# Patient Record
Sex: Male | Born: 1960 | Race: White | Hispanic: No | Marital: Married | Smoking: Current some day smoker
Health system: Southern US, Community
[De-identification: ages and names within clinical notes are randomized; demographics above are authoritative.]

## PROBLEM LIST (undated history)

## (undated) DIAGNOSIS — M353 Polymyalgia rheumatica: Secondary | ICD-10-CM

## (undated) DIAGNOSIS — I10 Essential (primary) hypertension: Secondary | ICD-10-CM

## (undated) DIAGNOSIS — R918 Other nonspecific abnormal finding of lung field: Secondary | ICD-10-CM

## (undated) DIAGNOSIS — J449 Chronic obstructive pulmonary disease, unspecified: Secondary | ICD-10-CM

## (undated) DIAGNOSIS — I700 Atherosclerosis of aorta: Secondary | ICD-10-CM

## (undated) DIAGNOSIS — M5136 Other intervertebral disc degeneration, lumbar region: Secondary | ICD-10-CM

## (undated) DIAGNOSIS — I5020 Unspecified systolic (congestive) heart failure: Secondary | ICD-10-CM

## (undated) DIAGNOSIS — Z7952 Long term (current) use of systemic steroids: Secondary | ICD-10-CM

## (undated) DIAGNOSIS — I7143 Infrarenal abdominal aortic aneurysm, without rupture: Secondary | ICD-10-CM

## (undated) DIAGNOSIS — I712 Thoracic aortic aneurysm, without rupture: Secondary | ICD-10-CM

## (undated) DIAGNOSIS — K219 Gastro-esophageal reflux disease without esophagitis: Secondary | ICD-10-CM

## (undated) DIAGNOSIS — Z720 Tobacco use: Secondary | ICD-10-CM

## (undated) DIAGNOSIS — I255 Ischemic cardiomyopathy: Secondary | ICD-10-CM

## (undated) DIAGNOSIS — R590 Localized enlarged lymph nodes: Secondary | ICD-10-CM

## (undated) DIAGNOSIS — I7121 Aneurysm of the ascending aorta, without rupture: Secondary | ICD-10-CM

## (undated) DIAGNOSIS — I2510 Atherosclerotic heart disease of native coronary artery without angina pectoris: Secondary | ICD-10-CM

## (undated) DIAGNOSIS — R001 Bradycardia, unspecified: Secondary | ICD-10-CM

## (undated) DIAGNOSIS — E785 Hyperlipidemia, unspecified: Secondary | ICD-10-CM

## (undated) DIAGNOSIS — Z7901 Long term (current) use of anticoagulants: Secondary | ICD-10-CM

## (undated) DIAGNOSIS — I714 Abdominal aortic aneurysm, without rupture, unspecified: Secondary | ICD-10-CM

## (undated) DIAGNOSIS — M51369 Other intervertebral disc degeneration, lumbar region without mention of lumbar back pain or lower extremity pain: Secondary | ICD-10-CM

## (undated) DIAGNOSIS — Z789 Other specified health status: Secondary | ICD-10-CM

## (undated) DIAGNOSIS — C3490 Malignant neoplasm of unspecified part of unspecified bronchus or lung: Secondary | ICD-10-CM

## (undated) DIAGNOSIS — N200 Calculus of kidney: Secondary | ICD-10-CM

## (undated) DIAGNOSIS — G459 Transient cerebral ischemic attack, unspecified: Secondary | ICD-10-CM

## (undated) HISTORY — DX: Abdominal aortic aneurysm, without rupture: I71.4

## (undated) HISTORY — DX: Hyperlipidemia, unspecified: E78.5

## (undated) HISTORY — PX: LUNG BIOPSY: SHX232

## (undated) HISTORY — DX: Abdominal aortic aneurysm, without rupture, unspecified: I71.40

## (undated) HISTORY — PX: APPENDECTOMY: SHX54

## (undated) HISTORY — DX: Polymyalgia rheumatica: M35.3

## (undated) HISTORY — DX: Aneurysm of the ascending aorta, without rupture: I71.21

## (undated) HISTORY — PX: CARDIAC CATHETERIZATION: SHX172

## (undated) HISTORY — DX: Unspecified systolic (congestive) heart failure: I50.20

## (undated) HISTORY — DX: Transient cerebral ischemic attack, unspecified: G45.9

## (undated) HISTORY — DX: Localized enlarged lymph nodes: R59.0

## (undated) HISTORY — DX: Thoracic aortic aneurysm, without rupture: I71.2

## (undated) HISTORY — DX: Malignant neoplasm of unspecified part of unspecified bronchus or lung: C34.90

## (undated) HISTORY — DX: Ischemic cardiomyopathy: I25.5

## (undated) HISTORY — PX: BACK SURGERY: SHX140

---

## 2006-01-01 HISTORY — PX: LEFT HEART CATH AND CORONARY ANGIOGRAPHY: CATH118249

## 2006-10-08 ENCOUNTER — Observation Stay (HOSPITAL_COMMUNITY): Admission: EM | Admit: 2006-10-08 | Discharge: 2006-10-09 | Payer: Self-pay | Admitting: Pediatrics

## 2006-10-08 ENCOUNTER — Ambulatory Visit: Payer: Self-pay | Admitting: Cardiology

## 2006-10-09 ENCOUNTER — Encounter: Payer: Self-pay | Admitting: Internal Medicine

## 2006-10-11 ENCOUNTER — Emergency Department: Payer: Self-pay | Admitting: Emergency Medicine

## 2006-10-15 ENCOUNTER — Ambulatory Visit: Payer: Self-pay

## 2006-10-15 LAB — CONVERTED CEMR LAB
Calcium: 9.1 mg/dL (ref 8.4–10.5)
Creatinine, Ser: 0.9 mg/dL (ref 0.4–1.5)
Glucose, Bld: 84 mg/dL (ref 70–99)
Platelets: 257 10*3/uL (ref 150–400)
Potassium: 4 meq/L (ref 3.5–5.1)
Prothrombin Time: 11.4 s (ref 10.9–13.3)
RBC: 4.75 M/uL (ref 4.22–5.81)
RDW: 12.2 % (ref 11.5–14.6)
Sodium: 143 meq/L (ref 135–145)
aPTT: 32.8 s — ABNORMAL HIGH (ref 21.7–29.8)

## 2006-10-17 ENCOUNTER — Ambulatory Visit: Payer: Self-pay | Admitting: Cardiology

## 2006-10-18 ENCOUNTER — Inpatient Hospital Stay (HOSPITAL_BASED_OUTPATIENT_CLINIC_OR_DEPARTMENT_OTHER): Admission: RE | Admit: 2006-10-18 | Discharge: 2006-10-18 | Payer: Self-pay | Admitting: Cardiovascular Disease

## 2006-10-18 ENCOUNTER — Ambulatory Visit: Payer: Self-pay | Admitting: Cardiovascular Disease

## 2006-11-04 ENCOUNTER — Ambulatory Visit: Payer: Self-pay | Admitting: Cardiology

## 2010-05-16 NOTE — Cardiovascular Report (Signed)
NAME:  Clinton Walsh, Clinton Walsh NO.:  [id number]   MEDICAL RECORD NO.:  [id number]          PATIENT TYPE:  OIB   LOCATION:  1961                         FACILITY:  [facility]   PHYSICIAN:  Veverly Fells. Excell Seltzer, MD  DATE OF BIRTH:  March 21, 1960   DATE OF PROCEDURE:  10/18/2006  DATE OF DISCHARGE:  10/18/2006                            CARDIAC CATHETERIZATION   PROCEDURE:  Left heart catheterization, selective coronary angiography,  left ventricular angiography.   INDICATIONS:  Clinton Walsh is a 50 year old gentleman with multiple  cardiac risk factors.  He was hospitalized with chest pain and ruled out  for MI.  He was brought to the office for an outpatient stress test and  was unable to walk on the treadmill due to severe hypertension.  He was  converted to dobutamine because of lung disease and with dobutamine he  developed severe hypertension once again with no increase in heart rate.  He was subsequently referred for cardiac catheterization.   Risks and indications of the procedure were reviewed with the patient  and informed consent was obtained.  The right groin was prepped, draped,  anesthetized with 1% lidocaine.  Using modified Seldinger technique a 4-  French sheath was placed in the right femoral artery.  Standard 4-French  catheters were used for coronary angiography.  A 4-French angled pigtail  catheter was used for left ventriculography.  All catheter exchanges  were performed over a guidewire.  There were no immediate complications.   FINDINGS:  Aortic pressure 102/57 with a mean of 77.  Left ventricular  pressure was 103/4.   Left main stem is angiographically normal.  It bifurcates into the LAD  and left circumflex.   The LAD is a medium caliber vessel that courses down and wraps around  the LV apex.  Proximal LAD is angiographically normal.  The LAD supplies  a large first diagonal branch that is angiographically normal.  Just  beyond the diagonal in the  midportion of the LAD there is a 20% smooth  stenosis.  The remaining portions of the mid and distal LAD have no  significant angiographic stenosis.  There is a small second diagonal  branch present arising from the midportion of the LAD.   The left circumflex is relatively small.  There is a small first OM.  The circumflex courses down and supplies a medium second OM branch.  The  AV groove circumflex beyond the second OM is small.  There is no  significant angiographic stenosis throughout the left circumflex system.   The right coronary artery is dominant.  Proximally there is minor  nonobstructive plaque.  The vessel supplies an acute marginal branch, a  PDA and 2 posterolateral branches.  Other than the proximal  nonobstructive plaque there is no other significant angiographic  stenosis throughout the right coronary artery or its branches.   Left ventricular function assessed by 30 degrees RAO left  ventriculography is normal.  The LVEF is 55%.  There is no mitral  regurgitation.   ASSESSMENT:  1. Minor nonobstructive plaque in the left anterior descending.  2. Minor nonobstructive plaque in the right coronary artery.  3. Normal left circumflex.  4. Normal left ventricular function.   PLAN:  Clinton Walsh has had noncardiac chest pain.  He has no  significant angiographic stenoses.  He does have nonobstructive plaque  in his coronaries and would benefit from secondary risk reduction with  an LDL goal less than 100 as well as tobacco cessation.  He will follow  up with Dr. Antoine Poche in the clinic.  Of note he has had a CT of the  chest during his hospitalization to rule out pulmonary embolism and  indeterminate lung nodules were visualized, warranting serial  radiographic follow-up.      Veverly Fells. Excell Seltzer, MD  Electronically Signed     MDC/MEDQ  D:  10/18/2006  T:  10/19/2006  Job:  [id number]   cc:   [name], MD, [facility]  Rollene Rotunda, MD, [facility]
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
## 2010-05-16 NOTE — Procedures (Signed)
[facility]                             NUCLEAR IMAGING STUDY   NAME:[name], Clinton Walsh                MRN:          [id number]  DATE:10/15/2006                            DOB:          [birth year]    Attempt was made today to do a stress Myoview. The study was started by  trying to have him walk on the treadmill. He developed marked  hypertension as his heart rate got increased to approximately 96 and the  study was stopped. The patient had significant lung disease and a  decision was made to proceed with dobutamine study. The patient was  stressed with dobutamine IV with a dose as high as 40 mcg/kg per minute.  He had no substantial increase in heart rate. He did have increasing  blood pressure with it. There was some slight chest tightness. However,  a decision was made not to obtain stress images. Therefore we have only  rest images.   All of this data is outlined in an office visit that was done  immediately after the study. The purpose of this note is to give a  narrative to explain this rest only Myoview scan.     [name], MD, [facility]  Electronically Signed    JDK/MedQ  DD: 10/15/2006  DT: 10/16/2006  Job #: [id number]
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
## 2010-10-12 LAB — COMPREHENSIVE METABOLIC PANEL
Calcium: 9.4
Creatinine, Ser: 1.09
Glucose, Bld: 101 — ABNORMAL HIGH
Sodium: 141
Total Bilirubin: 0.3
Total Protein: 7

## 2010-10-12 LAB — I-STAT 8, (EC8 V) (CONVERTED LAB)
Acid-Base Excess: 1
Bicarbonate: 21.9
Glucose, Bld: 82
HCT: 51
Hemoglobin: 17.3 — ABNORMAL HIGH
TCO2: 23
pH, Ven: 7.531 — ABNORMAL HIGH

## 2010-10-12 LAB — CBC
HCT: 45.3
Hemoglobin: 14.6
MCHC: 34
MCHC: 34.4
MCV: 92.9
MCV: 93
Platelets: 254
RBC: 4.87
RDW: 13.2
WBC: 4.4
WBC: 4.6

## 2010-10-12 LAB — CK TOTAL AND CKMB (NOT AT ARMC)
CK, MB: 1.9
Total CK: 78

## 2010-10-12 LAB — LIPID PANEL
Cholesterol: 212 — ABNORMAL HIGH
Total CHOL/HDL Ratio: 9.2
Triglycerides: 213 — ABNORMAL HIGH

## 2010-10-12 LAB — POCT I-STAT CREATININE: Creatinine, Ser: 1

## 2010-10-12 LAB — D-DIMER, QUANTITATIVE: D-Dimer, Quant: 0.9 — ABNORMAL HIGH

## 2010-10-12 LAB — DIFFERENTIAL
Eosinophils Absolute: 0.1
Eosinophils Relative: 2
Lymphocytes Relative: 36
Neutrophils Relative %: 51

## 2010-10-12 LAB — APTT: aPTT: 32

## 2010-10-12 LAB — CARDIAC PANEL(CRET KIN+CKTOT+MB+TROPI)
Relative Index: INVALID
Total CK: 129

## 2010-10-12 LAB — PROTIME-INR: Prothrombin Time: 13

## 2010-10-12 LAB — POCT CARDIAC MARKERS: Operator id: 285841

## 2010-10-12 LAB — TROPONIN I: Troponin I: 0.01

## 2011-03-16 ENCOUNTER — Other Ambulatory Visit: Payer: Self-pay

## 2011-03-16 ENCOUNTER — Encounter (HOSPITAL_COMMUNITY)
Admission: EM | Disposition: A | Discharge status: Home / Self Care | Payer: Self-pay | Source: Home / Self Care | Attending: Cardiology

## 2011-03-16 ENCOUNTER — Inpatient Hospital Stay (HOSPITAL_COMMUNITY)
Admission: EM | Admit: 2011-03-16 | Discharge: 2011-03-18 | DRG: 249 | Disposition: A | Discharge status: Home / Self Care | Payer: 59 | Attending: Cardiology | Admitting: Cardiology

## 2011-03-16 ENCOUNTER — Emergency Department (HOSPITAL_COMMUNITY): Payer: Self-pay

## 2011-03-16 ENCOUNTER — Encounter (HOSPITAL_COMMUNITY): Payer: Self-pay | Admitting: *Deleted

## 2011-03-16 DIAGNOSIS — I214 Non-ST elevation (NSTEMI) myocardial infarction: Principal | ICD-10-CM

## 2011-03-16 DIAGNOSIS — R001 Bradycardia, unspecified: Secondary | ICD-10-CM

## 2011-03-16 DIAGNOSIS — I2510 Atherosclerotic heart disease of native coronary artery without angina pectoris: Secondary | ICD-10-CM

## 2011-03-16 DIAGNOSIS — R748 Abnormal levels of other serum enzymes: Secondary | ICD-10-CM

## 2011-03-16 DIAGNOSIS — F17200 Nicotine dependence, unspecified, uncomplicated: Secondary | ICD-10-CM

## 2011-03-16 DIAGNOSIS — E785 Hyperlipidemia, unspecified: Secondary | ICD-10-CM

## 2011-03-16 DIAGNOSIS — Z87898 Personal history of other specified conditions: Secondary | ICD-10-CM

## 2011-03-16 DIAGNOSIS — I2119 ST elevation (STEMI) myocardial infarction involving other coronary artery of inferior wall: Secondary | ICD-10-CM

## 2011-03-16 DIAGNOSIS — I498 Other specified cardiac arrhythmias: Secondary | ICD-10-CM | POA: Diagnosis present

## 2011-03-16 DIAGNOSIS — R079 Chest pain, unspecified: Secondary | ICD-10-CM

## 2011-03-16 HISTORY — PX: LEFT HEART CATHETERIZATION WITH CORONARY ANGIOGRAM: SHX5451

## 2011-03-16 HISTORY — DX: Atherosclerotic heart disease of native coronary artery without angina pectoris: I25.10

## 2011-03-16 HISTORY — DX: Non-ST elevation (NSTEMI) myocardial infarction: I21.4

## 2011-03-16 HISTORY — DX: Tobacco use: Z72.0

## 2011-03-16 HISTORY — DX: Other nonspecific abnormal finding of lung field: R91.8

## 2011-03-16 LAB — POCT I-STAT TROPONIN I

## 2011-03-16 LAB — DIFFERENTIAL
Eosinophils Absolute: 0 10*3/uL (ref 0.0–0.7)
Eosinophils Relative: 0 % (ref 0–5)
Lymphs Abs: 1 10*3/uL (ref 0.7–4.0)
Monocytes Absolute: 0.7 10*3/uL (ref 0.1–1.0)
Neutro Abs: 7.8 10*3/uL — ABNORMAL HIGH (ref 1.7–7.7)

## 2011-03-16 LAB — CBC
HCT: 41.7 % (ref 39.0–52.0)
MCV: 89.5 fL (ref 78.0–100.0)
RBC: 4.66 MIL/uL (ref 4.22–5.81)
WBC: 9.5 10*3/uL (ref 4.0–10.5)

## 2011-03-16 LAB — CARDIAC PANEL(CRET KIN+CKTOT+MB+TROPI)
CK, MB: 141.5 ng/mL (ref 0.3–4.0)
Relative Index: 4.5 — ABNORMAL HIGH (ref 0.0–2.5)
Total CK: 146 U/L (ref 7–232)
Total CK: 1064 U/L — ABNORMAL HIGH (ref 7–232)
Troponin I: 0.47 ng/mL (ref <0.30)
Troponin I: 2.9 ng/mL (ref <0.30)

## 2011-03-16 LAB — COMPREHENSIVE METABOLIC PANEL
ALT: 16 U/L (ref 0–53)
AST: 24 U/L (ref 0–37)
Albumin: 3.8 g/dL (ref 3.5–5.2)
Alkaline Phosphatase: 81 U/L (ref 39–117)
Chloride: 103 mEq/L (ref 96–112)
GFR calc non Af Amer: >90 mL/min (ref >90)
Glucose, Bld: 123 mg/dL — ABNORMAL HIGH (ref 70–99)
Potassium: 4 mEq/L (ref 3.5–5.1)
Sodium: 138 mEq/L (ref 135–145)
Total Bilirubin: 0.3 mg/dL (ref 0.3–1.2)

## 2011-03-16 LAB — MRSA PCR SCREENING: MRSA by PCR: NEGATIVE

## 2011-03-16 LAB — D-DIMER, QUANTITATIVE: D-Dimer, Quant: 0.59 ug/mL-FEU — ABNORMAL HIGH (ref 0.00–0.48)

## 2011-03-16 SURGERY — LEFT HEART CATHETERIZATION WITH CORONARY ANGIOGRAM
Anesthesia: LOCAL

## 2011-03-16 MED ORDER — LIDOCAINE HCL (PF) 1 % IJ SOLN
INTRAMUSCULAR | Status: AC
  Filled 2011-03-16: qty 30

## 2011-03-16 MED ORDER — ATORVASTATIN CALCIUM 40 MG PO TABS
40.0000 mg | ORAL_TABLET | Freq: Every day | ORAL | Status: DC
  Administered 2011-03-16 – 2011-03-17 (×2): 40 mg via ORAL
  Filled 2011-03-16 (×3): qty 1

## 2011-03-16 MED ORDER — SODIUM CHLORIDE 0.9 % IV SOLN: 250.0000 mL | INTRAVENOUS | Status: DC | PRN

## 2011-03-16 MED ORDER — HYDROMORPHONE HCL PF 1 MG/ML IJ SOLN: 1.0000 mg | Freq: Once | INTRAMUSCULAR | Status: DC

## 2011-03-16 MED ORDER — SODIUM CHLORIDE 0.9 % IV SOLN: INTRAVENOUS | Status: DC

## 2011-03-16 MED ORDER — HEPARIN BOLUS VIA INFUSION
3000.0000 [IU] | Freq: Once | INTRAVENOUS | Status: AC
  Administered 2011-03-16: 3000 [IU] via INTRAVENOUS

## 2011-03-16 MED ORDER — FENTANYL CITRATE 0.05 MG/ML IJ SOLN
INTRAMUSCULAR | Status: AC
  Filled 2011-03-16: qty 2

## 2011-03-16 MED ORDER — SODIUM CHLORIDE 0.9 % IJ SOLN
3.0000 mL | Freq: Two times a day (BID) | INTRAMUSCULAR | Status: DC
  Administered 2011-03-17: 09:00:00 via INTRAVENOUS
  Administered 2011-03-17 – 2011-03-18 (×2): 3 mL via INTRAVENOUS

## 2011-03-16 MED ORDER — HYDROMORPHONE HCL PF 1 MG/ML IJ SOLN
1.0000 mg | Freq: Once | INTRAMUSCULAR | Status: AC
  Administered 2011-03-16: 1 mg via INTRAVENOUS
  Filled 2011-03-16: qty 1

## 2011-03-16 MED ORDER — MORPHINE SULFATE 2 MG/ML IJ SOLN
1.0000 mg | INTRAMUSCULAR | Status: DC | PRN
Start: 2011-03-16 — End: 2011-03-18

## 2011-03-16 MED ORDER — ACETAMINOPHEN 325 MG PO TABS
650.0000 mg | ORAL_TABLET | ORAL | Status: DC | PRN
  Administered 2011-03-16: 650 mg via ORAL
  Filled 2011-03-16: qty 2

## 2011-03-16 MED ORDER — SODIUM CHLORIDE 0.9 % IJ SOLN: 3.0000 mL | Freq: Two times a day (BID) | INTRAMUSCULAR | Status: DC

## 2011-03-16 MED ORDER — ASPIRIN EC 81 MG PO TBEC
81.0000 mg | DELAYED_RELEASE_TABLET | Freq: Every day | ORAL | Status: DC
  Filled 2011-03-16: qty 1

## 2011-03-16 MED ORDER — ONDANSETRON HCL 4 MG/2ML IJ SOLN: 4.0000 mg | Freq: Four times a day (QID) | INTRAMUSCULAR | Status: DC | PRN

## 2011-03-16 MED ORDER — NITROGLYCERIN IN D5W 200-5 MCG/ML-% IV SOLN
2.0000 ug/min | Freq: Once | INTRAVENOUS | Status: AC
  Administered 2011-03-16: 5 ug/min via INTRAVENOUS
  Filled 2011-03-16: qty 250

## 2011-03-16 MED ORDER — ASPIRIN 81 MG PO CHEW
324.0000 mg | CHEWABLE_TABLET | Freq: Once | ORAL | Status: DC
  Filled 2011-03-16: qty 1

## 2011-03-16 MED ORDER — ASPIRIN 81 MG PO CHEW: 324.0000 mg | CHEWABLE_TABLET | ORAL | Status: DC

## 2011-03-16 MED ORDER — SODIUM CHLORIDE 0.9 % IV SOLN: INTRAVENOUS | Status: AC

## 2011-03-16 MED ORDER — NITROGLYCERIN 0.2 MG/ML ON CALL CATH LAB
INTRAVENOUS | Status: AC
  Filled 2011-03-16: qty 1

## 2011-03-16 MED ORDER — SODIUM CHLORIDE 0.9 % IJ SOLN: 3.0000 mL | INTRAMUSCULAR | Status: DC | PRN

## 2011-03-16 MED ORDER — HEPARIN (PORCINE) IN NACL 100-0.45 UNIT/ML-% IJ SOLN
800.0000 [IU]/h | INTRAMUSCULAR | Status: DC
  Administered 2011-03-16: 800 [IU]/h via INTRAVENOUS
  Filled 2011-03-16: qty 250

## 2011-03-16 MED ORDER — HEPARIN SODIUM (PORCINE) 1000 UNIT/ML IJ SOLN
INTRAMUSCULAR | Status: AC
  Filled 2011-03-16: qty 1

## 2011-03-16 MED ORDER — BIVALIRUDIN 250 MG IV SOLR
INTRAVENOUS | Status: AC
  Filled 2011-03-16: qty 250

## 2011-03-16 MED ORDER — HEPARIN (PORCINE) IN NACL 2-0.9 UNIT/ML-% IJ SOLN
INTRAMUSCULAR | Status: AC
  Filled 2011-03-16: qty 2000

## 2011-03-16 MED ORDER — CLOPIDOGREL BISULFATE 75 MG PO TABS
75.0000 mg | ORAL_TABLET | Freq: Every day | ORAL | Status: DC
  Administered 2011-03-17 – 2011-03-18 (×2): 75 mg via ORAL
  Filled 2011-03-16 (×3): qty 1

## 2011-03-16 MED ORDER — ASPIRIN 81 MG PO CHEW
81.0000 mg | CHEWABLE_TABLET | Freq: Every day | ORAL | Status: DC
  Administered 2011-03-16 – 2011-03-18 (×3): 81 mg via ORAL
  Filled 2011-03-16 (×2): qty 1

## 2011-03-16 MED ORDER — VERAPAMIL HCL 2.5 MG/ML IV SOLN
INTRAVENOUS | Status: AC
  Filled 2011-03-16: qty 2

## 2011-03-16 MED ORDER — MIDAZOLAM HCL 2 MG/2ML IJ SOLN
INTRAMUSCULAR | Status: AC
  Filled 2011-03-16: qty 2

## 2011-03-16 MED ORDER — ACETAMINOPHEN 325 MG PO TABS: 650.0000 mg | ORAL_TABLET | ORAL | Status: DC | PRN

## 2011-03-16 MED ORDER — NITROGLYCERIN 0.4 MG SL SUBL: 0.4000 mg | SUBLINGUAL_TABLET | SUBLINGUAL | Status: DC | PRN

## 2011-03-16 MED ORDER — CLOPIDOGREL BISULFATE 300 MG PO TABS
ORAL_TABLET | ORAL | Status: AC
  Filled 2011-03-16: qty 2

## 2011-03-16 MED ORDER — ZOLPIDEM TARTRATE 5 MG PO TABS: 5.0000 mg | ORAL_TABLET | Freq: Every evening | ORAL | Status: DC | PRN

## 2011-03-16 NOTE — ED Notes (Signed)
Patient c/o chest pain, right sided, radiating to right arm, patient states sharp pain in right arm, patient states spots in vision field, patient states he experienced one episode like this before, checked in hospital with no diagnosis resulting 1-2 years ago.

## 2011-03-16 NOTE — ED Notes (Signed)
Steinl, MD notified of abnormal lab test results 

## 2011-03-16 NOTE — ED Notes (Signed)
High troponin on this patient.  Pa-kirichenko notified.
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
## 2011-03-16 NOTE — CV Procedure (Signed)
   Cardiac Catheterization Procedure Note  Name: Clinton Walsh MRN: [id number] DOB: [birth year]  Procedure: Left Heart Cath, Selective Coronary Angiography, LV angiography, PTCA and stenting of the diagonal 1  Indication:  51 year old with chest pain, positive enzymes, nondiagnostic ECG, and ongoing pain.  Seen by Dr. Graciela Husbands.  Emergent cath recommended.  Patient consented.    Procedural Details:  The right wrist was prepped, draped, and anesthetized with 1% lidocaine. Using the modified Seldinger technique, a 5 French sheath was introduced into the right radial artery. 3 mg of verapamil was administered through the sheath, weight-based unfractionated heparin was administered intravenously. Standard Judkins catheters were used for selective coronary angiography and left ventriculography. Catheter exchanges were performed over an exchange length guidewire.  PROCEDURAL FINDINGS Hemodynamics: AO 110/69 (86) LV 102/19 No gradient on pullback.    Coronary angiography: Coronary dominance: right  Left mainstem: No significant obstruction  Left anterior descending (LAD): Coursed to the apex.  Long area of segmental narrowing in mid vessel of 30% without flow limitation.  First diagonal is totally occluded.  Prior films show moderately large distribution branch  Left circumflex (LCx): Mild irregularity in the ostium and mid OM, but no critical obstruction.    Right coronary artery (RCA): Moderate sized vessel with mild ostial narrowing.  Mild irregularity at the proximal mid junction without significant stenosis.  Left ventriculography performed after PCI:   Left ventricular systolic function is reduced with anterolateral hypokinesis, moderate , LVEF is estimated at 45-50%, there is no significant mitral regurgitation   PCI Note:  Following the diagnostic procedure, the decision was made to proceed with PCI.  Weight-based bivalirudin was given for anticoagulation. Once a therapeutic ACT was  achieved, a 5 [place]  JL 3.5 Judkins  guide catheter was inserted.  A prowater coronary guidewire was used to cross the lesion.  The lesion was predilated with a 1.81mm and 2.75mm balloon.  The lesion was then stented with a 2.25 by 18 Minivision stent. The patient noted that he did not like medication.   The stent was postdilated with a 2.25  Noncompliant [place] APEX balloon.  Following PCI, there was 0% residual stenosis and TIMI-3 flow. Final angiography confirmed an excellent result. The patient tolerated the procedure well. There were no immediate procedural complications. A TR band was used for radial hemostasis. The patient was transferred to the post catheterization recovery area for further monitoring.  PCI Data: Vessel - LAD diagonal/Segment -  15 Percent Stenosis (pre)  100 TIMI-flow 0 Stent Minivision 2.25 by 18mm Percent Stenosis (post) 0 TIMI-flow (post) 3  Angio:  There was about 30-40% smooth narrowing from the ostium out, then total occlusion. After wire reperfusion, the ruptured plaque was noted.  There remained ostial disease as the angle was too narrow for proximal stenting.  The total occlusion was stented with a step and step down result, with three small to moderate distal branches beyond.  Timi 3 flow was restored.    Final Conclusions:   1.  Acute MI, non STEMI, secondary to new diagonal occlusion from 2008 2.  Moderate anterolateral WMA with mildly reduced LV. 3.  Tobacco abuse    Recommendations:  1.  DAPT for one year if possible 2.  Tobacco cessation counseling 3.  Initiation of statin therapy 4.  Withhold of beta blockade for now secondary to bradycardia.    Shawnie Pons, MD, [facility], FSCAI 03/16/2011, 9:33 PM

## 2011-03-16 NOTE — ED Provider Notes (Cosign Needed)
History     CSN: [id number]  Arrival date & time 03/16/11  1313   First MD Initiated Contact with Patient 03/16/11 1319      Chief Complaint  Patient presents with  . Chest Pain    right is sided     (Consider location/radiation/quality/duration/timing/severity/associated sxs/prior treatment) Patient is a 51 y.o. male presenting with chest pain. The history is provided by the patient.  Chest Pain The chest pain began less than 1 hour ago. Chest pain occurs constantly. The chest pain is unchanged. The pain is associated with breathing, coughing and lifting. At its most intense, the pain is at 9/10. The pain is currently at 9/10. The severity of the pain is moderate. The quality of the pain is described as pleuritic, sharp and stabbing. The pain radiates to the right shoulder and right arm. Primary symptoms include nausea. Pertinent negatives for primary symptoms include no fever, no shortness of breath, no cough, no palpitations, no abdominal pain, no vomiting and no dizziness.  Associated symptoms include numbness.  Pertinent negatives for associated symptoms include no weakness.   Pt states pain onset sudden, pain constant and sharp since then, worsened with breathing and movement. States "my right arm feels dead." States he is able to move and feel it but it fees asleep. States pain also radiates to the back. Denies fever, chills, abdominal pain, vomiting, shortness of breath.  No past medical history on file.  No past surgical history on file.  No family history on file.  History  Substance Use Topics  . Smoking status: Not on file  . Smokeless tobacco: Not on file  . Alcohol Use: No      Review of Systems  Constitutional: Negative for fever and chills.  HENT: Negative.   Eyes: Negative.   Respiratory: Negative for cough and shortness of breath.   Cardiovascular: Positive for chest pain. Negative for palpitations.  Gastrointestinal: Positive for nausea. Negative for  vomiting and abdominal pain.  Genitourinary: Negative.   Musculoskeletal: Negative.   Skin: Negative.   Neurological: Positive for numbness. Negative for dizziness and weakness.  Psychiatric/Behavioral: Negative.     Allergies  Sulfa antibiotics  Home Medications  No current outpatient prescriptions on file.  BP 159/92  Pulse 49  Temp(Src) 97.2 F (36.2 C) (Oral)  Resp 20  SpO2 100%  Physical Exam  Nursing note and vitals reviewed. Constitutional: He is oriented to person, place, and time. He appears well-developed and well-nourished.  HENT:  Head: Normocephalic.  Eyes: Conjunctivae are normal.  Neck: Neck supple.  Cardiovascular: Normal rate, regular rhythm and normal heart sounds.   Pulmonary/Chest: Effort normal and breath sounds normal. No respiratory distress. He exhibits tenderness.       Tenderness with palpation over right chest and right pectoralis muscle. Pain reproduced with right arm movement.  Abdominal: Soft. Bowel sounds are normal. He exhibits no distension.  Musculoskeletal:       5/5 and equal right arm strength compared to the left. Normal sensation. Good radial pulse in right hand. Good cap refill <2 sec in all figners  Neurological: He is alert and oriented to person, place, and time. No cranial nerve deficit.  Skin: Skin is warm and dry. No erythema.  Psychiatric: He has a normal mood and affect.    ED Course  Procedures (including critical care time)  Pt with Right sided chest pain, radiating into right arm. Pain with respirations. Pt appears in distress, pain medications ordered, CXR ordered. ECG with  no acute findings other then bradycardia. Pt received aspirin prior to arrival.    Date: 03/16/2011  Rate: 49  Rhythm: sinus bradycardia  QRS Axis: normal  Intervals: normal  ST/T Wave abnormalities: ST depressions anteriorly  Conduction Disutrbances:none  Narrative Interpretation:   Old EKG Reviewed: changes noted rate slightly increased  today, last was 39.  3:38 PM D dimer elevated. Will get CT angio. Troponin elevated as well, will recheck lab value  4:09 PM Lab troponin elevated. Heparin and nitro started. Will call cardiology.  Spoke with [facility] cardiology, will come see pt.   Results for orders placed during the hospital encounter of 03/16/11  CBC      Component Value Range   WBC 9.5  4.0 - 10.5 (K/uL)   RBC 4.66  4.22 - 5.81 (MIL/uL)   Hemoglobin 14.6  13.0 - 17.0 (g/dL)   HCT 40.9  81.1 - 91.4 (%)   MCV 89.5  78.0 - 100.0 (fL)   MCH 31.3  26.0 - 34.0 (pg)   MCHC 35.0  30.0 - 36.0 (g/dL)   RDW 78.2  95.6 - 21.3 (%)   Platelets 239  150 - 400 (K/uL)  DIFFERENTIAL      Component Value Range   Neutrophils Relative 82 (*) 43 - 77 (%)   Neutro Abs 7.8 (*) 1.7 - 7.7 (K/uL)   Lymphocytes Relative 11 (*) 12 - 46 (%)   Lymphs Abs 1.0  0.7 - 4.0 (K/uL)   Monocytes Relative 7  3 - 12 (%)   Monocytes Absolute 0.7  0.1 - 1.0 (K/uL)   Eosinophils Relative 0  0 - 5 (%)   Eosinophils Absolute 0.0  0.0 - 0.7 (K/uL)   Basophils Relative 0  0 - 1 (%)   Basophils Absolute 0.0  0.0 - 0.1 (K/uL)  COMPREHENSIVE METABOLIC PANEL      Component Value Range   Sodium 138  135 - 145 (mEq/L)   Potassium 4.0  3.5 - 5.1 (mEq/L)   Chloride 103  96 - 112 (mEq/L)   CO2 24  19 - 32 (mEq/L)   Glucose, Bld 123 (*) 70 - 99 (mg/dL)   BUN 17  6 - 23 (mg/dL)   Creatinine, Ser 0.86  0.50 - 1.35 (mg/dL)   Calcium 9.2  8.4 - 57.8 (mg/dL)   Total Protein 7.2  6.0 - 8.3 (g/dL)   Albumin 3.8  3.5 - 5.2 (g/dL)   AST 24  0 - 37 (U/L)   ALT 16  0 - 53 (U/L)   Alkaline Phosphatase 81  39 - 117 (U/L)   Total Bilirubin 0.3  0.3 - 1.2 (mg/dL)   GFR calc non Af Amer >90  >90 (mL/min)   GFR calc Af Amer >90  >90 (mL/min)  D-DIMER, QUANTITATIVE      Component Value Range   D-Dimer, Quant 0.59 (*) 0.00 - 0.48 (ug/mL-FEU)  POCT I-STAT TROPONIN I      Component Value Range   Troponin i, poc 0.10 (*) 0.00 - 0.08 (ng/mL)   Comment NOTIFIED  PHYSICIAN     Comment 3           CARDIAC PANEL(CRET KIN+CKTOT+MB+TROPI)      Component Value Range   Total CK 146  7 - 232 (U/L)   CK, MB 6.6 (*) 0.3 - 4.0 (ng/mL)   Troponin I 0.47 (*) <0.30 (ng/mL)   Relative Index 4.5 (*) 0.0 - 2.5    Dg Chest 2 View  03/16/2011  *  RADIOLOGY REPORT*  Clinical Data: Chest pain.  CHEST - 2 VIEW  Comparison: 04/08/2007 chest CT  Findings: Heart and mediastinal contours are within normal limits. No focal opacities or effusions.  No acute bony abnormality.  IMPRESSION: No active cardiopulmonary disease.  Original Report Authenticated By: Cyndie Chime, M.D.      No results found.   No diagnosis found.    MDM          Lottie Mussel, PA 03/16/11 1610

## 2011-03-16 NOTE — Interval H&P Note (Signed)
History and Physical Interval Note:  03/16/2011 7:47 PM  Clinton Walsh  has presented today for surgery, with the diagnosis of non STEMI.  Cardiac enzymes are elevated and Dr. Graciela Husbands has requested urgent cardiac catheterization.  The various methods of treatment have been discussed with the patient and family. After consideration of risks, benefits and other options for treatment, the patient has consented to  Procedure(s) (LRB): LEFT HEART CATHETERIZATION WITH CORONARY ANGIOGRAM (N/A) as a surgical intervention .  The patients' history has been reviewed, patient examined, no change in status, stable for surgery.  I have reviewed the patients' chart and labs.  Questions were answered to the patient's satisfaction.      Shawnie Pons

## 2011-03-16 NOTE — Progress Notes (Signed)
Will draw POC troponin and then make a decision about going to lab  Given ongoing chest and arm pain and positive cardiac enzymes Will also give another dose of narcotics for pain relief after we make the decision

## 2011-03-16 NOTE — H&P (View-Only) (Signed)
Tn 0.4>>1.4   Will anticipate going to cath lab   Urgently  Will discuss with dr TS 

## 2011-03-16 NOTE — Progress Notes (Signed)
Tn 0.4>>1.4   Will anticipate going to cath lab   Urgently  Will discuss with dr Zara Chess

## 2011-03-16 NOTE — ED Provider Notes (Addendum)
Dr Fredricka Bonine has left - had signed out that cardiology was coming to admit, chest pain, elev troponin.  Dr Graciela Husbands from [facility] has seen, will admit. Requests we add Ct Angio chest to orders/workup - ordered.   Suzi Roots, MD 03/16/11 1751  Dr Graciela Husbands indicates has adjusted pts plan, requests d/c ct angio chest, requests repeat troponin be done and if is increasing he will plan to take to cath lab. Lab called to draw troponin. Repeat much higher than first, Dr Graciela Husbands notified - they will take to cath lab  Suzi Roots, MD 03/16/11 [phone]

## 2011-03-16 NOTE — Progress Notes (Signed)
ANTICOAGULATION CONSULT NOTE - Initial Consult  Pharmacy Consult for Heparin Indication: chest pain/ACS  Allergies  Allergen Reactions  . Sulfa Antibiotics Other (See Comments)    unknown    Patient Measurements:   Heparin Dosing Weight: 60 kg (pt reported weight)  Vital Signs: Temp: 97.2 F (36.2 C) (03/15 1317) Temp src: Oral (03/15 1317) BP: 119/79 mmHg (03/15 1621) Pulse Rate: 50  (03/15 1621)  Labs:  Basename 03/16/11 1433 03/16/11 1358  HGB -- 14.6  HCT -- 41.7  PLT -- 239  APTT -- --  LABPROT -- --  INR -- --  HEPARINUNFRC -- --  CREATININE -- 0.90  CKTOTAL 146 --  CKMB 6.6* --  TROPONINI 0.47* --   CrCl is unknown because there is no height on file for the current visit.  Medical History: No past medical history on file.  Medications: none PTA  Assessment: 51 yo M presents with chest pain and + troponins. No hx bleeding noted.   Goal of Therapy:  Heparin level 0.3-0.7 units/ml   Plan:  1. Heparin 3000 units bolus, infusion rate 800 units/hr and Continue platelet monitoring per protocol 2. Check 6 hour heparin level 3. Check daily heparin level & CBC  Elson Clan 03/16/2011,4:25 PM

## 2011-03-17 DIAGNOSIS — R079 Chest pain, unspecified: Secondary | ICD-10-CM

## 2011-03-17 DIAGNOSIS — I214 Non-ST elevation (NSTEMI) myocardial infarction: Principal | ICD-10-CM

## 2011-03-17 LAB — URINALYSIS, ROUTINE W REFLEX MICROSCOPIC
Bilirubin Urine: NEGATIVE
Glucose, UA: NEGATIVE mg/dL
Hgb urine dipstick: NEGATIVE
Ketones, ur: NEGATIVE mg/dL
Leukocytes, UA: NEGATIVE
Protein, ur: NEGATIVE mg/dL
pH: 5.5 (ref 5.0–8.0)

## 2011-03-17 LAB — LIPID PANEL
HDL: 38 mg/dL — ABNORMAL LOW (ref >39)
Triglycerides: 83 mg/dL (ref <150)

## 2011-03-17 LAB — BASIC METABOLIC PANEL
Calcium: 8.5 mg/dL (ref 8.4–10.5)
GFR calc non Af Amer: >90 mL/min (ref >90)
Glucose, Bld: 85 mg/dL (ref 70–99)
Potassium: 3.6 mEq/L (ref 3.5–5.1)
Sodium: 138 mEq/L (ref 135–145)

## 2011-03-17 LAB — HEMOGLOBIN A1C
Hgb A1c MFr Bld: 5.6 % (ref <5.7)
Mean Plasma Glucose: 114 mg/dL (ref <117)

## 2011-03-17 LAB — CBC
Hemoglobin: 13.6 g/dL (ref 13.0–17.0)
MCH: 30.8 pg (ref 26.0–34.0)
MCHC: 34.7 g/dL (ref 30.0–36.0)
Platelets: 204 10*3/uL (ref 150–400)
RBC: 4.41 MIL/uL (ref 4.22–5.81)

## 2011-03-17 LAB — TSH: TSH: 2.589 u[IU]/mL (ref 0.350–4.500)

## 2011-03-17 NOTE — Progress Notes (Signed)
Pt transferred to [id number] via wheelchair with nurse tech, in no apparent distress. Report called into [employer].

## 2011-03-17 NOTE — Progress Notes (Signed)
Right radial cath site with scant amount of capillary oozing noted. New tegaderm placed. No hematoma noted. Education reinforced with use of right arm and positioning on pillow above heart. Pt voiced understanding. Will monitor.

## 2011-03-17 NOTE — Progress Notes (Signed)
CARDIAC REHAB PHASE I   PRE:  Rate/Rhythm: 56 sinus  BP:  Supine:   Sitting: 119/80  Standing:    SaO2: 97 RA  MODE:  Ambulation: 350 ft   POST:  Rate/Rhythem: 56 sinus  BP:  Supine:   Sitting: 124/81  Standing:    SaO2: 100 RA  Order received and appreciated.  Chart reviewed.  Pt ambulated 350 ft with assist x1.  Pt tolerated walk well without c/o.  Pt returned to chair after walk with call bell in reach.  Discussed pt d/c education: diet, exercise, restrictions, smoking cessation, stent/Plavix, CP/NTG use/calling 911, and when to call MD.  Pt voiced concern about cost of medications and rehab.  Please have social worker meet with him to help with financial cost.  Discussed Cardiac Rehab Phase II and pt wishes to have info sent to [facility] for PhII.  Pt voiced understanding.Consuella Lose, MA, ACSM RCEP [phone]

## 2011-03-17 NOTE — Progress Notes (Signed)
Patient ID: Clinton Walsh, male   DOB: 09/01/1960, 51 y.o.   MRN: [id number] @ Subjective:  Denies SSCP, palpitations or Dyspnea Hungry  Objective:  Filed Vitals:   03/17/11 0600 03/17/11 0700 03/17/11 0732 03/17/11 0800  BP: 112/75 120/76  113/72  Pulse:   47   Temp:   97.8 F (36.6 C)   TempSrc:   Oral   Resp:   16   Height:      Weight:      SpO2: 97% 99% 96% 96%    Intake/Output from previous day:  Intake/Output Summary (Last 24 hours) at 03/17/11 0810 Last data filed at 03/17/11 0725  Gross per 24 hour  Intake 1172.5 ml  Output    601 ml  Net  571.5 ml    Physical Exam: General appearance: alert and cooperative Lungs: clear to auscultation bilaterally Heart: regular rate and rhythm, S1, S2 normal, no murmur, click, rub or gallop Abdomen: soft, non-tender; bowel sounds normal; no masses,  no organomegaly Extremities: extremities normal, atraumatic, no cyanosis or edema Pulses: 2+ and symmetric Neurologic: Grossly normal  Lab Results: Basic Metabolic Panel:  Basename 03/17/11 0545 03/16/11 1358  NA 138 138  K 3.6 4.0  CL 106 103  CO2 23 24  GLUCOSE 85 123*  BUN 14 17  CREATININE 0.84 0.90  CALCIUM 8.5 9.2  MG -- --  PHOS -- --   Liver Function Tests:  Basename 03/16/11 1358  AST 24  ALT 16  ALKPHOS 81  BILITOT 0.3  PROT 7.2  ALBUMIN 3.8   No results found for this basename: LIPASE:2,AMYLASE:2 in the last 72 hours CBC:  Basename 03/17/11 0545 03/16/11 1358  WBC 5.1 9.5  NEUTROABS -- 7.8*  HGB 13.6 14.6  HCT 39.2 41.7  MCV 88.9 89.5  PLT 204 239   Cardiac Enzymes:  Basename 03/16/11 2148 03/16/11 1830 03/16/11 1433  CKTOTAL 1064* 382* 146  CKMB 141.5* 43.2* 6.6*  CKMBINDEX -- -- --  TROPONINI >25.00* 2.90* 0.47*   BNP: No components found with this basename: POCBNP:3 D-Dimer:  Basename 03/16/11 1358  DDIMER 0.59*   Hemoglobin A1C: No results found for this basename: HGBA1C in the last 72 hours Fasting Lipid  Panel:  Basename 03/17/11 0545  CHOL 173  HDL 38*  LDLCALC 118*  TRIG 83  CHOLHDL 4.6  LDLDIRECT --     Imaging: Dg Chest 2 View  03/16/2011  *RADIOLOGY REPORT*  Clinical Data: Chest pain.  CHEST - 2 VIEW  Comparison: 04/08/2007 chest CT  Findings: Heart and mediastinal contours are within normal limits. No focal opacities or effusions.  No acute bony abnormality.  IMPRESSION: No active cardiopulmonary disease.  Original Report Authenticated By: Cyndie Chime, M.D.    Cardiac Studies:  ECG: 3/15 SB rate 48  No acute changes   Telemetry: SR rate 60's no VT  Echo:   Medications:     . aspirin  324 mg Oral Once  . aspirin  81 mg Oral Daily  . aspirin EC  81 mg Oral Daily  . atorvastatin  40 mg Oral q1800  . bivalirudin      . clopidogrel      . clopidogrel  75 mg Oral Q breakfast  . fentaNYL      . heparin      . heparin      . heparin  3,000 Units Intravenous Once  .  HYDROmorphone (DILAUDID) injection  1 mg Intravenous Once  . lidocaine      .  midazolam      . nitroGLYCERIN      . nitroGLYCERIN  2-200 mcg/min Intravenous Once  . sodium chloride  3 mL Intravenous Q12H  . sodium chloride  3 mL Intravenous Q12H  . verapamil      . DISCONTD: aspirin  324 mg Oral Pre-Cath  . DISCONTD:  HYDROmorphone (DILAUDID) injection  1 mg Intravenous Once       . sodium chloride Stopped (03/17/11 0400)  . DISCONTD: sodium chloride    . DISCONTD: heparin 800 Units/hr (03/16/11 1727)    Assessment/Plan:  SEMI:  S/P PCI Diagonal  Fairly large CPK rise.  ASA and Plavix No beta blocker due to bradycardia.  EF 45-50% by cath Chol:  Statin added  Transfer to telemetry   Charlton Haws 03/17/2011, 8:10 AM

## 2011-03-18 ENCOUNTER — Encounter (HOSPITAL_COMMUNITY): Payer: Self-pay | Admitting: Nurse Practitioner

## 2011-03-18 ENCOUNTER — Other Ambulatory Visit: Payer: Self-pay

## 2011-03-18 DIAGNOSIS — I214 Non-ST elevation (NSTEMI) myocardial infarction: Secondary | ICD-10-CM

## 2011-03-18 DIAGNOSIS — F17200 Nicotine dependence, unspecified, uncomplicated: Secondary | ICD-10-CM

## 2011-03-18 DIAGNOSIS — Z87898 Personal history of other specified conditions: Secondary | ICD-10-CM

## 2011-03-18 DIAGNOSIS — R001 Bradycardia, unspecified: Secondary | ICD-10-CM

## 2011-03-18 DIAGNOSIS — I2510 Atherosclerotic heart disease of native coronary artery without angina pectoris: Secondary | ICD-10-CM

## 2011-03-18 DIAGNOSIS — E785 Hyperlipidemia, unspecified: Secondary | ICD-10-CM

## 2011-03-18 HISTORY — DX: Non-ST elevation (NSTEMI) myocardial infarction: I21.4

## 2011-03-18 MED ORDER — ATORVASTATIN CALCIUM 80 MG PO TABS: 80.0000 mg | ORAL_TABLET | Freq: Every day | ORAL | Status: DC

## 2011-03-18 MED ORDER — ASPIRIN 81 MG PO CHEW: 81.0000 mg | CHEWABLE_TABLET | Freq: Every day | ORAL | Status: DC

## 2011-03-18 MED ORDER — NITROGLYCERIN 0.4 MG SL SUBL: 0.4000 mg | SUBLINGUAL_TABLET | SUBLINGUAL | Status: DC | PRN

## 2011-03-18 MED ORDER — CLOPIDOGREL BISULFATE 75 MG PO TABS: 75.0000 mg | ORAL_TABLET | Freq: Every day | ORAL | Status: DC

## 2011-03-18 NOTE — Discharge Summary (Signed)
Patient ID: Clinton Walsh,  MRN: [id number], DOB/AGE: 49/13/1962 49 y.o.  Admit date: 03/16/2011 Discharge date: 03/18/2011  Primary Care Provider: None Primary Cardiologist: J. Hochrein, MD  Discharge Diagnoses Principal Problem:  *NSTEMI (non-ST elevated myocardial infarction) Active Problems:  CAD (coronary artery disease)  Hyperlipidemia  Tobacco abuse  History of multiple pulmonary nodules  Bradycardia   Allergies Allergies  Allergen Reactions  . Sulfa Antibiotics Other (See Comments)    unknown   Procedures  Cardiac Catheterization and Percutaneous Coronary Intervention 03/16/2011  Hemodynamics: AO 110/69 (86) LV 102/19 No gradient on pullback.   Left mainstem: No significant obstruction Left anterior descending (LAD): Coursed to the apex.  Long area of segmental narrowing in mid vessel of 30% without flow limitation.  First diagonal is totally occluded.  Prior films show moderately large distribution branch Left circumflex (LCx): Mild irregularity in the ostium and mid OM, but no critical obstruction.    Right coronary artery (RCA): Moderate sized vessel with mild ostial narrowing.  Mild irregularity at the proximal mid junction without significant stenosis. Left ventriculography performed after PCI:   Left ventricular systolic function is reduced with anterolateral hypokinesis, moderate , LVEF is estimated at 45-50%, there is no significant mitral regurgitation    **The First Diagonal was successfully stented with a 2.25 x 18 mm MiniVision BMS** ________________________________  History of Present Illness  51 year old male with prior history of chest pain and had nonobstructive CAD by catheterization in 2008 who was in his usual state of health until the day of admission when he had sudden onset of severe, stabbing, right-sided chest pain with a throbbing type radiation to his right shoulder and scapula and down the right arm. Pain was worse with inspiration  and palpation and associated with mild dyspnea, nausea, and vomiting x1. His coworkers called EMS and he was transported to the [facility] ED where his troponin was found to be elevated 0.47 with a CK-MB of 6.6. Repeat a troponin was further elevated at 1.4 and decision was made to pursue urgent diagnostic cardiac catheterization.  Hospital Course  Patient underwent urgent diagnostic cardiac catheterization revealing a total occlusion of the first diagonal branch of the LAD and otherwise nonobstructive disease. This was a new finding compared this catheterization in 2008. Review of old films showed that this branch supplied a moderately large distribution. As result, percutaneous intervention was undertaken upon the first diagonal with subsequent placement of a mini-vision bare metal stent. Patient tolerated this procedure well and was monitored in the coronary intensive care unit where he was placed on aspirin, Plavix, and statin therapy. Because of baseline bradycardia, we were unable to initiate beta blocker therapy.  Patient had marked elevations in his cardiac enzymes, eventually peaking his CK 1140, MB at 146.4, and troponin I at greater than 25. Post percutaneous intervention, he had no further chest discomfort. He was seen by cardiac rehabilitation and has ambulated without recurrent symptoms or limitations. He was transferred out to the floor on March 16 and has since been stable. He will be discharged home today in good condition.  Discharge Vitals Blood pressure 128/79, pulse 51, temperature 98.2 F (36.8 C), temperature source Oral, resp. rate 18, height 5\' 6"  (1.676 m), weight 128 lb 8 oz (58.287 kg), SpO2 97.00%.  Filed Weights   03/16/11 2221 03/17/11 1600  Weight: 128 lb 15.5 oz (58.5 kg) 128 lb 8 oz (58.287 kg)   Labs  CBC  Basename 03/17/11 0545 03/16/11 1358  WBC 5.1 9.5  NEUTROABS -- 7.8*  HGB 13.6 14.6  HCT 39.2 41.7  MCV 88.9 89.5  PLT 204 239   Basic Metabolic  Panel  Basename 03/17/11 0545 03/16/11 1358  NA 138 138  K 3.6 4.0  CL 106 103  CO2 23 24  GLUCOSE 85 123*  BUN 14 17  CREATININE 0.84 0.90  CALCIUM 8.5 9.2  MG -- --  PHOS -- --   Liver Function Tests  Basename 03/16/11 1358  AST 24  ALT 16  ALKPHOS 81  BILITOT 0.3  PROT 7.2  ALBUMIN 3.8   Cardiac Enzymes  Basename 03/17/11 1319 03/16/11 2148 03/16/11 1830  CKTOTAL 1140* 1064* 382*  CKMB 146.4* 141.5* 43.2*  CKMBINDEX -- -- --  TROPONINI >25.00* >25.00* 2.90*   D-Dimer  Basename 03/16/11 1358  DDIMER 0.59*   Hemoglobin A1C  Basename 03/17/11 0545  HGBA1C 5.6   Fasting Lipid Panel  Basename 03/17/11 0545  CHOL 173  HDL 38*  LDLCALC 118*  TRIG 83  CHOLHDL 4.6  LDLDIRECT --   Thyroid Function Tests  Basename 03/17/11 0545  TSH 2.589  T4TOTAL --  T3FREE --  THYROIDAB --   Disposition  Pt is being discharged home today in good condition.  Follow-up Plans & Appointments  Follow-up Information    Follow up with Rollene Rotunda, MD in 2 weeks. (We will arrange.)    Contact information:   [address], [address] [place] [address] [phone]         Discharge Medications  Medication List  As of 03/18/2011  1:17 PM   TAKE these medications         aspirin 81 MG chewable tablet   Chew 1 tablet (81 mg total) by mouth daily.      atorvastatin 80 MG tablet   Commonly known as: LIPITOR   Take 1 tablet (80 mg total) by mouth daily at 6 PM.      clopidogrel 75 MG tablet   Commonly known as: PLAVIX   Take 1 tablet (75 mg total) by mouth daily with breakfast.      nitroGLYCERIN 0.4 MG SL tablet   Commonly known as: NITROSTAT   Place 1 tablet (0.4 mg total) under the tongue every 5 (five) minutes x 3 doses as needed for chest pain.           **BETA BLOCKER NOT PRESCRIBED SECONDARY TO BASELINE BRADYCARDIA**   Outstanding Labs/Studies  Follow-up lipids/lft's in 6-8 wks.  Duration of  Discharge Encounter   Greater than 30 minutes including physician time.  Signed, Nicolasa Ducking NP 03/18/2011, 1:17 PM

## 2011-03-18 NOTE — Progress Notes (Signed)
Discharge review done with patient.   Patient acknowledged understanding of information provided.  Prescriptions given per MD's order.  Patient is stable and discharged home with wife. Clinton Walsh  

## 2011-03-18 NOTE — Discharge Instructions (Signed)
***  PLEASE REMEMBER TO BRING ALL OF YOUR MEDICATIONS TO EACH OF YOUR FOLLOW-UP OFFICE VISITS.  

## 2011-03-18 NOTE — Progress Notes (Signed)
Patient ID: Clinton Walsh, male   DOB: 01-Jul-1960, 51 y.o.   MRN: [id number] @ Subjective:  Denies SSCP, palpitations or Dyspnea Wants to go home  Objective:  Filed Vitals:   03/17/11 1500 03/17/11 1600 03/17/11 2100 03/18/11 0515  BP: 113/72 128/84 111/72 128/79  Pulse:  50 115 51  Temp:  98.4 F (36.9 C) 98.5 F (36.9 C) 98.2 F (36.8 C)  TempSrc:  Oral    Resp:  20 16 18   Height:  5\' 6"  (1.676 m)    Weight:  58.287 kg (128 lb 8 oz)    SpO2: 98% 100% 96% 97%    Intake/Output from previous day:  Intake/Output Summary (Last 24 hours) at 03/18/11 1125 Last data filed at 03/17/11 1500  Gross per 24 hour  Intake    120 ml  Output      0 ml  Net    120 ml    Physical Exam: General appearance: alert and cooperative Lungs: clear to auscultation bilaterally Heart: regular rate and rhythm, S1, S2 normal, no murmur, click, rub or gallop Abdomen: soft, non-tender; bowel sounds normal; no masses,  no organomegaly Extremities: extremities normal, atraumatic, no cyanosis or edema Pulses: 2+ and symmetric Neurologic: Grossly normal  Lab Results: Basic Metabolic Panel:  Basename 03/17/11 0545 03/16/11 1358  NA 138 138  K 3.6 4.0  CL 106 103  CO2 23 24  GLUCOSE 85 123*  BUN 14 17  CREATININE 0.84 0.90  CALCIUM 8.5 9.2  MG -- --  PHOS -- --   Liver Function Tests:  Basename 03/16/11 1358  AST 24  ALT 16  ALKPHOS 81  BILITOT 0.3  PROT 7.2  ALBUMIN 3.8   No results found for this basename: LIPASE:2,AMYLASE:2 in the last 72 hours CBC:  Basename 03/17/11 0545 03/16/11 1358  WBC 5.1 9.5  NEUTROABS -- 7.8*  HGB 13.6 14.6  HCT 39.2 41.7  MCV 88.9 89.5  PLT 204 239   Cardiac Enzymes:  Basename 03/17/11 1319 03/16/11 2148 03/16/11 1830  CKTOTAL 1140* 1064* 382*  CKMB 146.4* 141.5* 43.2*  CKMBINDEX -- -- --  TROPONINI >25.00* >25.00* 2.90*   BNP: No components found with this basename: POCBNP:3 D-Dimer:  Basename 03/16/11 1358  DDIMER 0.59*    Hemoglobin A1C:  Basename 03/17/11 0545  HGBA1C 5.6   Fasting Lipid Panel:  Basename 03/17/11 0545  CHOL 173  HDL 38*  LDLCALC 118*  TRIG 83  CHOLHDL 4.6  LDLDIRECT --     Imaging: Dg Chest 2 View  03/16/2011  *RADIOLOGY REPORT*  Clinical Data: Chest pain.  CHEST - 2 VIEW  Comparison: 04/08/2007 chest CT  Findings: Heart and mediastinal contours are within normal limits. No focal opacities or effusions.  No acute bony abnormality.  IMPRESSION: No active cardiopulmonary disease.  Original Report Authenticated By: Cyndie Chime, M.D.    Cardiac Studies:  ECG: 3/15 SB rate 48  No acute changes   Telemetry: SR rate 60's no VT  Echo:   Medications:      . aspirin  324 mg Oral Once  . aspirin  81 mg Oral Daily  . atorvastatin  40 mg Oral q1800  . clopidogrel  75 mg Oral Q breakfast  . sodium chloride  3 mL Intravenous Q12H  . DISCONTD: sodium chloride  3 mL Intravenous Q12H       Assessment/Plan:  SEMI:  S/P PCI Diagonal  Fairly large CPK rise.  ASA and Plavix No beta blocker due  to bradycardia.  EF 45-50% by cath Chol:  Statin added D/C home F/U Dr Graciela Husbands or Delena Bali [facility] 03/18/2011, 11:25 AM

## 2011-03-19 LAB — POCT ACTIVATED CLOTTING TIME: Activated Clotting Time: 523 seconds

## 2011-03-19 MED FILL — Dextrose Inj 5%: INTRAVENOUS | Qty: 50 | Status: AC

## 2011-04-02 ENCOUNTER — Encounter: Payer: Self-pay | Admitting: Physician Assistant

## 2011-04-11 ENCOUNTER — Ambulatory Visit: Payer: Self-pay | Admitting: Nurse Practitioner

## 2011-04-13 LAB — COMPREHENSIVE METABOLIC PANEL
Anion Gap: 8 (ref 7–16)
Co2: 26 mmol/L (ref 21–32)
Creatinine: 1.27 mg/dL (ref 0.60–1.30)
Osmolality: 279 (ref 275–301)
Potassium: 3.7 mmol/L (ref 3.5–5.1)
SGPT (ALT): 21 U/L
Sodium: 138 mmol/L (ref 136–145)

## 2011-04-13 LAB — CBC
MCH: 31.1 pg (ref 26.0–34.0)
MCHC: 33.8 g/dL (ref 32.0–36.0)
MCV: 92 fL (ref 80–100)
RBC: 4.54 10*6/uL (ref 4.40–5.90)

## 2011-04-13 LAB — TROPONIN I: Troponin-I: 0.03 ng/mL

## 2011-04-14 ENCOUNTER — Inpatient Hospital Stay: Payer: Self-pay | Admitting: Internal Medicine

## 2011-04-14 DIAGNOSIS — I214 Non-ST elevation (NSTEMI) myocardial infarction: Secondary | ICD-10-CM

## 2011-04-14 LAB — LIPID PANEL
Cholesterol: 192 mg/dL (ref 0–200)
HDL Cholesterol: 31 mg/dL — ABNORMAL LOW (ref 40–60)
Ldl Cholesterol, Calc: 134 mg/dL — ABNORMAL HIGH (ref 0–100)
Triglycerides: 137 mg/dL (ref 0–200)

## 2011-04-14 LAB — CK TOTAL AND CKMB (NOT AT ARMC)
CK, Total: 273 U/L — ABNORMAL HIGH (ref 35–232)
CK-MB: 47.6 ng/mL — ABNORMAL HIGH (ref 0.5–3.6)

## 2011-04-14 LAB — TROPONIN I
Troponin-I: 1.81 ng/mL — ABNORMAL HIGH
Troponin-I: 4.5 ng/mL — ABNORMAL HIGH

## 2011-04-15 LAB — BASIC METABOLIC PANEL WITH GFR
Anion Gap: 8
BUN: 17 mg/dL
Calcium, Total: 8.4 mg/dL — ABNORMAL LOW
Chloride: 108 mmol/L — ABNORMAL HIGH
Co2: 25 mmol/L
Creatinine: 1.01 mg/dL
EGFR (African American): >60
EGFR (Non-African Amer.): >60
Glucose: 82 mg/dL
Osmolality: 282
Potassium: 4.1 mmol/L
Sodium: 141 mmol/L

## 2011-04-15 LAB — CBC WITH DIFFERENTIAL/PLATELET
Basophil %: 0.6 %
HGB: 13.3 g/dL (ref 13.0–18.0)
Lymphocyte #: 1.2 10*3/uL (ref 1.0–3.6)
MCH: 31.4 pg (ref 26.0–34.0)
MCHC: 33.9 g/dL (ref 32.0–36.0)
MCV: 93 fL (ref 80–100)
Platelet: 215 10*3/uL (ref 150–440)
RDW: 13.2 % (ref 11.5–14.5)

## 2011-04-15 LAB — MAGNESIUM: Magnesium: 1.8 mg/dL

## 2011-04-16 DIAGNOSIS — I2510 Atherosclerotic heart disease of native coronary artery without angina pectoris: Secondary | ICD-10-CM

## 2011-04-16 LAB — CK TOTAL AND CKMB (NOT AT ARMC)
CK, Total: 91 U/L (ref 35–232)
CK-MB: 5 ng/mL — ABNORMAL HIGH (ref 0.5–3.6)

## 2011-04-17 LAB — BASIC METABOLIC PANEL
Anion Gap: 7 (ref 7–16)
BUN: 14 mg/dL (ref 7–18)
Calcium, Total: 8 mg/dL — ABNORMAL LOW (ref 8.5–10.1)
Chloride: 104 mmol/L (ref 98–107)
Creatinine: 1.12 mg/dL (ref 0.60–1.30)
EGFR (Non-African Amer.): >60
Potassium: 4.2 mmol/L (ref 3.5–5.1)
Sodium: 139 mmol/L (ref 136–145)

## 2011-04-25 ENCOUNTER — Encounter: Payer: Self-pay | Admitting: Cardiovascular Disease

## 2011-04-26 ENCOUNTER — Ambulatory Visit (INDEPENDENT_AMBULATORY_CARE_PROVIDER_SITE_OTHER): Payer: Self-pay | Admitting: Cardiovascular Disease

## 2011-04-26 ENCOUNTER — Encounter: Payer: Self-pay | Admitting: *Deleted

## 2011-04-26 VITALS — BP 132/92 | HR 51 | Ht 65.0 in | Wt 124.5 lb

## 2011-04-26 DIAGNOSIS — E785 Hyperlipidemia, unspecified: Secondary | ICD-10-CM

## 2011-04-26 DIAGNOSIS — R079 Chest pain, unspecified: Secondary | ICD-10-CM

## 2011-04-26 DIAGNOSIS — I2510 Atherosclerotic heart disease of native coronary artery without angina pectoris: Secondary | ICD-10-CM

## 2011-04-26 DIAGNOSIS — Z720 Tobacco use: Secondary | ICD-10-CM

## 2011-04-26 DIAGNOSIS — I498 Other specified cardiac arrhythmias: Secondary | ICD-10-CM

## 2011-04-26 DIAGNOSIS — R001 Bradycardia, unspecified: Secondary | ICD-10-CM

## 2011-04-26 DIAGNOSIS — F17200 Nicotine dependence, unspecified, uncomplicated: Secondary | ICD-10-CM

## 2011-04-26 DIAGNOSIS — R0602 Shortness of breath: Secondary | ICD-10-CM

## 2011-04-26 NOTE — Assessment & Plan Note (Signed)
The patient had a recent subacute stent thrombosis likely do to noncompliance with dual antiplatelet therapy. He was treated successfully with a balloon angioplasty and thrombectomy. He is now taking his medications regularly. I recommend continuing aspirin indefinitely and Plavix for at least 12 months. I discussed with him the importance of aggressive lifestyle changes. I also advised him to start low-level exercise. He is not able to attend cardiac rehabilitation due to financial reasons.

## 2011-04-26 NOTE — Assessment & Plan Note (Signed)
Continue treatment with pravastatin. He will need a fasting lipid and liver profile upon followup.
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
## 2011-04-26 NOTE — Patient Instructions (Signed)
Continue same medications.  Try to quit smoking.  Follow up in 3 months.

## 2011-04-26 NOTE — Assessment & Plan Note (Signed)
This is better currently. He is overall asymptomatic even when his heart rate was in the upper 30s.

## 2011-04-26 NOTE — Assessment & Plan Note (Signed)
I had a prolonged discussion with him about the importance of smoking cessation. 

## 2011-05-29 ENCOUNTER — Emergency Department: Payer: Self-pay | Admitting: Emergency Medicine

## 2011-06-06 LAB — WOUND AEROBIC CULTURE

## 2011-07-27 ENCOUNTER — Ambulatory Visit: Payer: Self-pay | Admitting: Cardiovascular Disease

## 2011-08-02 ENCOUNTER — Ambulatory Visit (INDEPENDENT_AMBULATORY_CARE_PROVIDER_SITE_OTHER): Payer: Self-pay | Admitting: Cardiovascular Disease

## 2011-08-02 ENCOUNTER — Encounter: Payer: Self-pay | Admitting: Cardiovascular Disease

## 2011-08-02 VITALS — BP 126/86 | HR 63 | Ht 65.0 in | Wt 129.0 lb

## 2011-08-02 DIAGNOSIS — Z720 Tobacco use: Secondary | ICD-10-CM

## 2011-08-02 DIAGNOSIS — E785 Hyperlipidemia, unspecified: Secondary | ICD-10-CM

## 2011-08-02 DIAGNOSIS — F17200 Nicotine dependence, unspecified, uncomplicated: Secondary | ICD-10-CM

## 2011-08-02 DIAGNOSIS — I2510 Atherosclerotic heart disease of native coronary artery without angina pectoris: Secondary | ICD-10-CM

## 2011-08-02 MED ORDER — CLOPIDOGREL BISULFATE 75 MG PO TABS: 75.0000 mg | ORAL_TABLET | Freq: Every day | ORAL | Status: AC
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
## 2011-08-02 NOTE — Assessment & Plan Note (Signed)
He is on small dose pravastatin. He thinks that his lower extremity discomfort is due to this medication. I asked him to hold this medication for a few days to see if symptoms improve. Otherwise he is to resume the medication.

## 2011-08-02 NOTE — Assessment & Plan Note (Signed)
The patient seems to be stable since last visit. He ran out of Plavix few days ago. I stressed to him again the importance of taking this medication regularly. Given that he had stent thrombosis, I want him to stay on this for at least one year although he did have a bare-metal stent placed. Decrease aspirin to 81 mg once daily.

## 2011-08-02 NOTE — Assessment & Plan Note (Signed)
I again discussed with him the importance of smoking cessation. 

## 2011-08-02 NOTE — Patient Instructions (Addendum)
Decrease Aspirin to 81 mg once daily.  Continue Plavix 75 mg once daily.  You can stop Pravastatin for few days to see if leg discomfort gets better.  Follow up in 6 months.

## 2011-09-18 ENCOUNTER — Observation Stay: Payer: Self-pay | Admitting: Internal Medicine

## 2011-09-18 LAB — CBC WITH DIFFERENTIAL/PLATELET
Basophil %: 1 %
Eosinophil %: 2.3 %
HCT: 42 % (ref 40.0–52.0)
HGB: 14.5 g/dL (ref 13.0–18.0)
Lymphocyte #: 1.2 10*3/uL (ref 1.0–3.6)
MCH: 31.5 pg (ref 26.0–34.0)
MCV: 91 fL (ref 80–100)
Monocyte #: 0.6 x10 3/mm (ref 0.2–1.0)
Neutrophil #: 3.4 10*3/uL (ref 1.4–6.5)
Platelet: 270 10*3/uL (ref 150–440)
RBC: 4.6 10*6/uL (ref 4.40–5.90)

## 2011-09-18 LAB — COMPREHENSIVE METABOLIC PANEL
Anion Gap: 7 (ref 7–16)
BUN: 9 mg/dL (ref 7–18)
Bilirubin,Total: 0.3 mg/dL (ref 0.2–1.0)
Chloride: 105 mmol/L (ref 98–107)
Co2: 29 mmol/L (ref 21–32)
Creatinine: 1.12 mg/dL (ref 0.60–1.30)
EGFR (African American): >60
EGFR (Non-African Amer.): >60
Potassium: 3.6 mmol/L (ref 3.5–5.1)
SGOT(AST): 20 U/L (ref 15–37)
SGPT (ALT): 16 U/L (ref 12–78)
Total Protein: 7.5 g/dL (ref 6.4–8.2)

## 2011-09-18 LAB — CK TOTAL AND CKMB (NOT AT ARMC): CK, Total: 56 U/L (ref 35–232)

## 2011-09-19 DIAGNOSIS — R079 Chest pain, unspecified: Secondary | ICD-10-CM

## 2011-09-19 LAB — CBC WITH DIFFERENTIAL/PLATELET
Basophil #: 0 10*3/uL (ref 0.0–0.1)
Eosinophil #: 0.2 10*3/uL (ref 0.0–0.7)
Eosinophil %: 3.9 %
MCH: 30.8 pg (ref 26.0–34.0)
Monocyte #: 0.6 x10 3/mm (ref 0.2–1.0)
Neutrophil %: 48.8 %
Platelet: 252 10*3/uL (ref 150–440)
RBC: 4.43 10*6/uL (ref 4.40–5.90)
WBC: 4.3 10*3/uL (ref 3.8–10.6)

## 2011-09-19 LAB — BASIC METABOLIC PANEL
Anion Gap: 8 (ref 7–16)
Calcium, Total: 8.4 mg/dL — ABNORMAL LOW (ref 8.5–10.1)
Chloride: 108 mmol/L — ABNORMAL HIGH (ref 98–107)
Co2: 26 mmol/L (ref 21–32)
Creatinine: 0.97 mg/dL (ref 0.60–1.30)
Sodium: 142 mmol/L (ref 136–145)

## 2011-09-19 LAB — TROPONIN I
Troponin-I: <0.02 ng/mL
Troponin-I: <0.02 ng/mL

## 2011-09-24 ENCOUNTER — Telehealth: Payer: Self-pay

## 2011-09-24 NOTE — Telephone Encounter (Signed)
Patient removed asbestos for about 2 years and was wondering if this could be the cause of his shortness of breath and heart problem. Please advise. The patient has a follow up in October.

## 2011-09-24 NOTE — Telephone Encounter (Signed)
That can be best checked by a Pulmonary specialist. You can refer him to Dr. Kendrick Fries.

## 2011-09-26 ENCOUNTER — Telehealth: Payer: Self-pay

## 2011-09-26 ENCOUNTER — Other Ambulatory Visit: Payer: Self-pay

## 2011-09-26 DIAGNOSIS — R0602 Shortness of breath: Secondary | ICD-10-CM

## 2011-09-26 DIAGNOSIS — Z77090 Contact with and (suspected) exposure to asbestos: Secondary | ICD-10-CM

## 2011-09-26 NOTE — Telephone Encounter (Signed)
Patient will follow up with [facility] with Dr. Sherene Sires on Oct. 15, 2013.

## 2011-09-26 NOTE — Telephone Encounter (Signed)
Notified patient per Dr. Kirke Corin can refer the patient to Dr. Kendrick Fries for evaluation of shortness of breath due to asbestos.

## 2011-09-26 NOTE — Telephone Encounter (Signed)
Patient appointment with Dr. Sherene Sires with [facility] on Tuesday, Oct. 15, 2013 at 1:30 pm need to arrive at 1:15 pm. Please call at [phone] to cancel or reschedule.

## 2011-10-16 ENCOUNTER — Encounter: Payer: Self-pay | Admitting: Internal Medicine

## 2011-10-16 ENCOUNTER — Ambulatory Visit (INDEPENDENT_AMBULATORY_CARE_PROVIDER_SITE_OTHER): Payer: Self-pay | Admitting: Internal Medicine

## 2011-10-16 VITALS — BP 132/90 | HR 88 | Temp 98.2°F | Ht 65.0 in | Wt 128.8 lb

## 2011-10-16 DIAGNOSIS — R0609 Other forms of dyspnea: Secondary | ICD-10-CM

## 2011-10-16 DIAGNOSIS — R0600 Dyspnea, unspecified: Secondary | ICD-10-CM | POA: Insufficient documentation

## 2011-10-16 DIAGNOSIS — R0602 Shortness of breath: Secondary | ICD-10-CM

## 2011-10-16 MED ORDER — FAMOTIDINE 20 MG PO TABS: ORAL_TABLET | ORAL | Status: DC

## 2011-10-16 MED ORDER — PANTOPRAZOLE SODIUM 40 MG PO TBEC: 40.0000 mg | DELAYED_RELEASE_TABLET | Freq: Every day | ORAL | Status: DC

## 2011-10-16 NOTE — Patient Instructions (Addendum)
Potonix (pantoprazole) or omeprazole (Prilosec) 40 mg Take 30-60 min before first meal of the day and pepcid 20 mg at bedtime for a one month trial  GERD (REFLUX)  is an extremely common cause of respiratory symptoms and unusual chest pains just like yours, many times with no significant heartburn at all.    It can be treated with medication, but also with lifestyle changes including avoidance of late meals, excessive alcohol, smoking cessation, and avoid fatty foods, chocolate, peppermint, colas, red wine, and acidic juices such as orange juice.  NO MINT OR MENTHOL PRODUCTS SO NO COUGH DROPS  USE SUGARLESS CANDY INSTEAD (jolley ranchers or Stover's)  NO OIL BASED VITAMINS - use powdered substitutes. Avoid foods that cause gas like [place] food, beans, salads, under cooked vegetables, spinach and boiled eggs  No need for pulmonary follow up at this point, you do not have any significant lung problem including asbestosis and if you stop smoking completely now it is unlikely you will ever have lung limitations

## 2011-10-16 NOTE — Assessment & Plan Note (Signed)
-   10/16/2011 walked 400 ft on RA no sob, no desat - Spirometry 10/16/2011 truncated exp loop, voluntary portion only   Symptoms are markedly disproportionate to objective findings and not clear this is a lung problem but pt does appear to have difficult airway management issues. DDX of  difficult airways managment all start with A and  include Adherence, Ace Inhibitors, Acid Reflux, Active Sinus Disease, Alpha 1 Antitripsin deficiency, Anxiety masquerading as Airways dz,  ABPA,  allergy(esp in young), Aspiration (esp in elderly), Adverse effects of DPI,  Active smokers, plus two Bs  = Bronchiectasis and Beta blocker use..and one C= CHF  Acid reflux > atypical cp and paroxysms of resting sob strongly suspicious for GERD > rec trial of ppi/diet/ h2 hs > follow up gi prn  Anxiety/ depression usually dx of exclusion but suspect very likely the source for many of his refractory/ atypical symptoms  Active smoking > discussed

## 2011-10-16 NOTE — Progress Notes (Signed)
  Subjective:    Patient ID: Clinton Walsh, male    DOB: [birth year]   MRN: [id number]  HPI  62 yowm active smoker with some asbestos exp in the 1980's removing from ceilings and had to be sawed off referred 10/16/2011 by Dr Kirke Corin to pulmonary clinic in [place].with sob/cp onset in 2013                   10/16/2011 1st pulmonary eval cc indolent onset x 6 months sob sometimes at hs resolves on its own ir after drink mild  but daily doe  with walking across parking lot or if gets upset, assoc with sensation of sorethroat and "coughing up acid" but no better with tums. No real sputum. Also migratory cp daily x 6 months, does not disturb sleep.  No obvious daytime variabilty chest tightness, subjective wheeze overt sinus or hb symptoms. No unusual exp hx or h/o childhood pna/ asthma or premature birth to his knowledge.  Has never used inhaler for sob or cough.    Sleeping ok without nocturnal  or early am exacerbation  of respiratory  c/o's or need for noct saba. Also denies any obvious fluctuation of symptoms with weather or environmental changes or other aggravating or alleviating factors except as outlined above   Review of Systems  Constitutional: Positive for appetite change and unexpected weight change. Negative for fever, chills and activity change.  HENT: Positive for sore throat and trouble swallowing. Negative for congestion, rhinorrhea, sneezing, dental problem, voice change and postnasal drip.   Eyes: Negative for visual disturbance.  Respiratory: Positive for shortness of breath. Negative for cough and choking.   Cardiovascular: Positive for chest pain. Negative for leg swelling.  Gastrointestinal: Negative for nausea, vomiting and abdominal pain.  Genitourinary: Negative for difficulty urinating.  Musculoskeletal: Negative for arthralgias.  Skin: Positive for rash.  Psychiatric/Behavioral: Negative for behavioral problems and confusion.       Objective:   Physical  Exam  Depressed amb wm with hopeless / helpless affect Wt 128 10/16/2011  HEENT mild turbinate edema.  Oropharynx no thrush or excess pnd or cobblestoning.  No JVD or cervical adenopathy. Mild accessory muscle hypertrophy. Trachea midline, nl thryroid. Chest was veyr min hyperinflated by percussion with diminished breath sounds and min increased exp time without wheeze. Hoover sign positive at  Very end of  inspiration. Regular rate and rhythm without murmur gallop or rub or increase P2 or edema.  Abd: no hsm, nl excursion. Ext warm without cyanosis or clubbing.      cxr 03/16/11  No active cardiopulmonary disease.       Assessment & Plan:

## 2013-02-02 ENCOUNTER — Telehealth: Payer: Self-pay | Admitting: *Deleted

## 2013-02-02 NOTE — Telephone Encounter (Signed)
Patient's wife called to see if the patient can take NSAID's or Etodolac for a minisus tear per [facility], Orthopaedic doctor. Please advise

## 2013-02-02 NOTE — Telephone Encounter (Signed)
lvm 2/2

## 2013-02-02 NOTE — Telephone Encounter (Signed)
Patient said he decided to stop all of his medications "about a year ago because I was feeling better". Patient states he is working out of town Mon-Fri and is only in town on weekends. He said he can not come in to see Dr. Fletcher Anon. Patient advised to find a Doctor in the area he works or refer back to Dr. Joie Bimler. I instructed patient to let Dr. Joie Bimler know that he has not seen by Dr. Fletcher Anon in over a year and has taken him self off his medications. Dr. Fletcher Anon can not comment on patient medications without seeing patient.

## 2013-02-02 NOTE — Telephone Encounter (Signed)
I have not seen him in more than a year. I don't know how his condition is and what medications he is taking. He should schedule a follow up visit.

## 2013-03-25 ENCOUNTER — Ambulatory Visit: Payer: Self-pay | Admitting: Orthopedic Surgery

## 2013-12-10 ENCOUNTER — Encounter (HOSPITAL_COMMUNITY): Payer: Self-pay | Admitting: Cardiology

## 2014-04-20 NOTE — H&P (Signed)
PATIENT NAME:  Clinton Walsh, Clinton Walsh MR#:  [id number] DATE OF BIRTH:  1960/06/07  DATE OF ADMISSION:  09/18/2011  PRIMARY CARE PHYSICIAN: None CARDIOLOGIST: Dr. Fletcher Anon   CHIEF COMPLAINT: Chest pain.  HISTORY OF PRESENT ILLNESS: Patient is a 54 year old male with known history of coronary artery disease is being admitted with suspected unstable angina. Patient was having chest pain on and off for last 2 to 3 days, was here at the hospital to see his dad as his dad was having surgery. While he was in the waiting room his chest pain really got worse. He felt chest pain very similar to what he had last time when he had myocardial infarction which required stenting and decided to come to the Emergency Department. He was also feeling nauseous. He also reported feeling like acid is coming out. His pain was radiating across chest. Initially it was on the left but then it started going on the right and now from the right side it is radiating to his elbow and the right hand. His pain initially was 5-6/10, now it is about 4/10. As per report he was also somewhat hypoxic. Patient was worried concerning his previous history of myocardial infarction requiring stenting and he is being admitted for further evaluation and management.  PAST MEDICAL HISTORY: 1. Coronary artery disease status post stenting.  2. Tobacco abuse.  PAST SURGICAL HISTORY: 1. Appendectomy. 2. Back surgery.  ALLERGIES: Sulfa.  SOCIAL HISTORY: He lives with his wife. Currently unemployed. Smokes up to half pack of cigarettes daily for the last 30 years. Also uses electronic cigarettes if needed. Uses alcohol occasionally. No illicit drug use.  FAMILY HISTORY: Very strong heart disease history in the family on both sides.   REVIEW OF SYSTEMS: CONSTITUTIONAL: No fever, fatigue, weakness. EYES: No blurry or double vision. ENT: No tinnitus or ear pain. RESPIRATORY: No cough, wheezing, hemoptysis. CARDIOVASCULAR: Positive for chest pain. No  orthopnea, edema. GASTROINTESTINAL: No nausea, vomiting, diarrhea. GENITOURINARY: No dysuria, hematuria. ENDOCRINE: No polyuria, nocturia. HEMATOLOGY: No anemia, easy bruising. SKIN: No rash or lesions. MUSCULOSKELETAL: No arthritis or muscle cramps. NEUROLOGICAL: No tingling, numbness, weakness. PSYCH: No history of anxiety, depression.   MEDICATIONS AT HOME: 1. Plavix once daily. 2. Nitroglycerin 0.4 mg sublingual as needed.  3. Aspirin once daily, does not know strength of either of those.   PHYSICAL EXAMINATION: VITAL SIGNS: Temperature 98, heart rate 69 per minute, respirations 18 per minute, blood pressure 134/69 mmHg. He is saturating 97% on room air.   GENERAL: Patient is a 54 year old male lying in the bed comfortably without any acute distress.  EYES: Pupils equal, round, reactive to light and accommodation. No scleral icterus. Extraocular movements are intact.   HENT: Head atraumatic, normocephalic. Oropharynx and nasopharynx clear.   NECK: Supple. No jugular venous distention. No thyroid enlargement or tenderness.   LUNGS: Clear to auscultation bilaterally. No wheezing, rales, rhonchi, crepitations.   CARDIOVASCULAR: S1, S2 normal. No murmurs, rales or gallops.   ABDOMEN: Soft, nontender, nondistended. Bowel sounds present. No organomegaly or mass.  EXTREMITIES: No pedal edema, cyanosis, clubbing.   NEUROLOGICAL: Nonfocal examination. Cranial nerves III through XII intact. Muscle strength 5/5 in all extremities. Sensation intact.  PSYCH: Patient is oriented to time, place and person x3.   SKIN: No obvious rash, lesion, ulcer.   LABORATORY, DIAGNOSTIC AND RADIOLOGICAL DATA: Normal BMP. Normal liver function tests. First set of cardiac enzymes normal. Normal CBC.   EKG shows normal sinus rhythm. No major ST-T changes. Somewhat bradycardic  with heart rate of 61 per minute.   Chest x-ray showed no acute cardiopulmonary disease.   IMPRESSION AND PLAN:  1. Suspected  unstable angina. Will do serial cardiac enzymes. Will obtain Myoview in the morning. Start him on aspirin, Plavix. Cannot use any beta blocker considering bradycardia. Will consult cardiology for evaluation in the morning. 2. Bradycardia, sinus in nature. Will monitor on off unit telemetry. 3. Coronary artery disease status post stenting. He is resumed on aspirin and Plavix. Will do serial troponins. 4. Tobacco abuse. He was counseled for three minutes. He is trying to quit and uses electronic cigarettes at home. Denies any need for nicotine replacement while in the hospital.   TIME SPENT: Total time taking care of this patient is 45 minutes.   ____________________________ Lucina Mellow. Manuella Ghazi, MD vss:cms D: 09/18/2011 20:24:24 ET T: 09/19/2011 06:43:32 ET JOB#: [id number]  cc: Xayne Brumbaugh S. Manuella Ghazi, MD, <Dictator> Muhammad A. Fletcher Anon, [place] MD ELECTRONICALLY SIGNED 09/20/2011 11:20

## 2014-04-20 NOTE — Discharge Summary (Signed)
PATIENT NAME:  Clinton Walsh, Clinton Walsh MR#:  [id number] DATE OF BIRTH:  1960/08/19  DATE OF ADMISSION:  09/18/2011 DATE OF DISCHARGE:  09/19/2011  DISCHARGE DIAGNOSES:  1. Chest pain, now resolved, likely noncardiac, with negative cardiac enzymes and negative stress test. Could be due to gastroesophageal reflux disease also.  2. Forearm cellulitis. Started on Keflex.   SECONDARY DIAGNOSIS: Coronary artery disease status post stenting.   CONSULTATION: Cardiology, Dr. Rockey Situ.  PROCEDURES/RADIOLOGY: Stress test on 09/18 was negative. Chest x-ray on 09/17 showed no acute cardiopulmonary disease.   HISTORY AND SHORT HOSPITAL COURSE: The patient is a 54 year old male with the above-mentioned medical problems who was admitted for chest pain. Please see Dr. Trena Platt dictated history and physical for further details. He was ruled out with negative three sets of troponin. He also underwent Myoview, which was negative, and was discharged home in stable condition. His chest pain was thought to be possible reflux disease.   PERTINENT PHYSICAL EXAMINATION ON THE DATE OF DISCHARGE: On the date of discharge, his vital signs were as follows: Temperature 98.3, heart rate 52 per minute, respirations 18 per minute, blood pressure 118/76. He was saturating 98% on room air. CARDIOVASCULAR: S1, S2 normal. No murmurs, rubs, or gallops. LUNGS: Clear to auscultation bilaterally. No wheezing, rales, rhonchi, or crepitation. ABDOMEN: Soft, benign. NEUROLOGIC: Nonfocal examination. All other physical examination remained at baseline.   DISCHARGE MEDICATIONS:  1. Aspirin 325 mg p.o. daily. 2. Plavix 75 mg p.o. daily.  3. Simvastatin 40 mg p.o. at bedtime. 4. Nitro-Bid 7.4 mg sublingual every 5 to 15 minutes as needed.   DISCHARGE DIET: Low sodium, low fat, low cholesterol.   DISCHARGE ACTIVITY: As tolerated.   DISCHARGE INSTRUCTIONS AND FOLLOW-UP: The patient was instructed to follow-up with his cardiologist, Dr. Fletcher Anon,  in 1 to 2 weeks.   TOTAL TIME DISCHARGING THIS PATIENT: 45 minutes.    ____________________________ Lucina Mellow. Manuella Ghazi, MD vss:ap D: 09/23/2011 16:55:54 ET T: 09/25/2011 10:50:57 ET JOB#: [id number]  cc: Pualani Borah S. Manuella Ghazi, MD, <Dictator> Muhammad A. Fletcher Anon, MD Remer Macho MD ELECTRONICALLY SIGNED 09/25/2011 11:12

## 2014-04-20 NOTE — Consult Note (Signed)
General Aspect 54 year old male with known history of coronary artery disease, NSTEMI in april 2013, continued smoking, presenting with chest pain.  Admitted with suspected unstable angina. Cardiology was consulted for chest pain sx.  Patient reports having chest pain on and off for last 2 to 3 days. He was here at the hospital to see his dad who was having surgery.  in the waiting room his chest pain really got worse. chest pain very similar to what he had last time when he had myocardial infarction which required stenting.   He reports sx of nausea,  feeling like acid in his chest. His pain was radiating across his chest. Initially it was on the left but then it started going on the right and now from the right side it is radiating to his elbow and the right hand. His pain initially was 5-6/10.    Cardiac enz neg x 2. Stress test ordered.    Present Illness .  Echo Doppler is showing normal LV systolic function with ejection fraction greater than 55%. Borderline concentric left ventricular hypertrophy is present and right-sided LV pressures are 30 to 40 mmHg. Trace aortic regurgitation is present.   Cardiac catheterization done by Dr. Marlyne Beards on 04/16/2011  severe one vessel coronary artery disease, occluded first diagonal due to subacute stent thrombosis. 100% stenosis at the site of prior stent. Thrombectomy and balloon angioplasty were done with minimal residual thrombus inside the stent and distal to the stent which will be treated medically. He had elevated troponin assay at 4.5 on admission  ALLERGIES: Sulfa.  SOCIAL HISTORY: He lives with his wife. Currently unemployed. Smokes up to half pack of cigarettes daily for the last 30 years. Also uses electronic cigarettes if needed. Uses alcohol occasionally. No illicit drug use.  FAMILY HISTORY: Very strong heart disease history in the family on both sides.   Physical Exam:   GEN well developed, well nourished, no acute distress     HEENT red conjunctivae    NECK supple    RESP normal resp effort  clear BS    CARD Regular rate and rhythm  No murmur    ABD denies tenderness    LYMPH negative neck    EXTR negative edema    SKIN normal to palpation    NEURO motor/sensory function intact    PSYCH alert, A+O to time, place, person, good insight   Review of Systems:   Subjective/Chief Complaint chest pain, acid in throat Chest pain over the past few days    General: No Complaints    Skin: No Complaints    ENT: No Complaints    Eyes: No Complaints    Neck: No Complaints    Respiratory: No Complaints    Cardiovascular: Chest pain or discomfort  Tightness    Gastrointestinal: acid reflux    Genitourinary: No Complaints    Vascular: No Complaints    Musculoskeletal: No Complaints    Neurologic: No Complaints    Hematologic: No Complaints    Endocrine: No Complaints    Psychiatric: No Complaints    Review of Systems: All other systems were reviewed and found to be negative    Medications/Allergies Reviewed Medications/Allergies reviewed     mi:    ptca:        Admit Diagnosis:   CHEST PAIN: 19-Sep-2011, Active, CHEST PAIN      Admit Reason:   Chest pain: (786.50) Active, ICD9, Unspecified chest pain  Home Medications: Medication Instructions Status  Aspirin Enteric Coated 325 mg oral delayed release tablet 1 tab(s) orally once a day Active  Nitro-Bid 0.4 milligram(s) sublingual every 5 minutes X3,for Chest Pain Active  Plavix 75 mg oral tablet 1 tab(s) orally once a day Active  simvastatin 40 mg oral tablet 1 tab(s) orally once a day (at bedtime) Active   Lab Results:  Routine Chem:  18-Sep-13 02:43    Glucose, Serum 92   BUN 14   Creatinine (comp) 0.97   Sodium, Serum 142   Potassium, Serum 3.9   Chloride, Serum  108   CO2, Serum 26   Calcium (Total), Serum  8.4   Anion Gap 8   Osmolality (calc) 283   eGFR (African American) >60   eGFR (Non-African American)  >60 (eGFR values <46m/min/1.73 m2 may be an indication of chronic kidney disease (CKD). Calculated eGFR is useful in patients with stable renal function. The eGFR calculation will not be reliable in acutely ill patients when serum creatinine is changing rapidly. It is not useful in  patients on dialysis. The eGFR calculation may not be applicable to patients at the low and high extremes of body sizes, pregnant women, and vegetarians.)  Cardiac:  18-Sep-13 02:43    Troponin I < 0.02 (0.00-0.05 0.05 ng/mL or less: NEGATIVE  Repeat testing in 3-6 hrs  if clinically indicated. >0.05 ng/mL: POTENTIAL  MYOCARDIAL INJURY. Repeat  testing in 3-6 hrs if  clinically indicated. NOTE: An increase or decrease  of 30% or more on serial  testing suggests a  clinically important change)  Routine Hem:  18-Sep-13 02:43    WBC (CBC) 4.3   RBC (CBC) 4.43   Hemoglobin (CBC) 13.7   Hematocrit (CBC) 40.4   Platelet Count (CBC) 252   MCV 91   MCH 30.8   MCHC 33.8   RDW 13.4   Neutrophil % 48.8   Lymphocyte % 33.8   Monocyte % 13.3   Eosinophil % 3.9   Basophil % 0.2   Neutrophil # 2.1   Lymphocyte # 1.5   Monocyte # 0.6   Eosinophil # 0.2   Basophil # 0.0 (Result(s) reported on 19 Sep 2011 at 02:58AM.)   Radiology Results: XRay:    17-Sep-13 19:00, Chest PA and Lateral   Chest PA and Lateral    REASON FOR EXAM:    chest pain  COMMENTS:       PROCEDURE: DXR - DXR CHEST PA (OR AP) AND LATERAL  - Sep 18 2011  7:00PM     RESULT: Comparison: 04/14/2011    Findings:  The heart and mediastinum are stable. There is suggestion of subtle   interstitial opacities. Otherwise, no focal pulmonary opacities. There is   borderline hyperinflation.    IMPRESSION:   There is suggestion of subtle interstitial edema.    Dictation site: 2          Verified By: RGregor Hams M.D., MD    Sulfa drugs: Rash  Vital Signs/Nurse's Notes: **Vital Signs.:   18-Sep-13 07:42   Vital Signs Type  Routine   Temperature Temperature (F) 98.2   Celsius 36.7   Temperature Source oral   Pulse Pulse 52   Respirations Respirations 18   Systolic BP Systolic BP 94   Diastolic BP (mmHg) Diastolic BP (mmHg) 63   Mean BP 73   Pulse Ox % Pulse Ox % 94   Pulse Ox Activity Level  At rest   Oxygen Delivery Room Air/ 21 %  Impression 54 year old male with known history of coronary artery disease, NSTEMI in april 2013, continued smoking, presenting with chest pain.  Admitted with suspected unstable angina. Cardiology was consulted for chest pain sx.  1) Chest pain: cardiac enz negative Medication noncompliance in the past with stent thrombosis requiring repeat intervention for dagonal stent --Stress test this AM currently pain free WOuld contiue current meds, smoking cessation OK to d/c of stress test egative  2) CAD, PCI to diagonal vessel stress test pending still smoking  3) SMoking cessation: discussed with him. Lfe long smoker  4) Hyperlipidemia:  on satin   Electronic Signatures: Ida Rogue (MD)  (Signed 18-Sep-13 09:42)  Authored: General Aspect/Present Illness, History and Physical Exam, Review of System, Past Medical History, Health Issues, Home Medications, Labs, Radiology, Allergies, Vital Signs/Nurse's Notes, Impression/Plan   Last Updated: 18-Sep-13 09:42 by Ida Rogue (MD)

## 2014-04-25 NOTE — Discharge Summary (Signed)
PATIENT NAME:  Clinton Walsh, Clinton Walsh MR#:  [id number] DATE OF BIRTH:  [birth year]  DATE OF ADMISSION:  04/14/2011 DATE OF DISCHARGE:  04/17/2011  ADMITTING PHYSICIAN: Clinton Climes, MD  DISCHARGING PHYSICIAN: Clinton Lighter, MD  PRIMARY CARE PHYSICIAN: None.  CONSULTANTS: Clinton Beards, MD - Cardiology.  DISCHARGE DIAGNOSES:  1. Non-ST-elevation myocardial infarction. 2. Coronary artery disease status post diagonal stent placed in March 2013 at [facility] presenting with in-stent thrombosis secondary to medication noncompliance.  3. Hyperlipidemia.  4. Tobacco use disorder.   DISCHARGE HOME MEDICATIONS: 1. Aspirin 325 mg p.o. daily.  2. Pravachol 20 mg p.o. daily.  3. Plavix 75 mg p.o. daily. 4. Nitroglycerin 0.4 mg sublingual every 5 minutes p.r.n. chest pain.   DISCHARGE DIET: Low-sodium diet.   DISCHARGE ACTIVITY: As tolerated.   FOLLOWUP INSTRUCTIONS:  1. Followup with Dr. Marlyne Walsh in 2 weeks. 2. Smoking cessation.  3. Advised to take medications as prescribed. 4. PCP followup in 2 to 3 weeks. The patient was given information about the [facility] and advised to follow up with them. They are supposed to call him at home with appointment time and also date.   LABS AND IMAGING STUDIES: Sodium 139, potassium 4.2, chloride 104, bicarbonate 28, BUN 14, creatinine 1.12, glucose 89, and calcium 8.0.   WBC 4.9, hemoglobin 13.3, hematocrit 39.3, and platelet count 215.   Echo Doppler is showing normal LV systolic function with ejection fraction greater than 55%. Borderline concentric left ventricular hypertrophy is present and right-sided LV pressures are 30 to 40 mmHg. Trace aortic regurgitation is present.   Cardiac catheterization done by Dr. Marlyne Walsh on 04/16/2011 showed severe one vessel coronary artery disease, occluded first diagonal due to subacute stent thrombosis. 100% stenosis at the site of prior stent. Thrombectomy and balloon angioplasty were  done with minimal residual thrombus inside the stent and distal to the stent which will be treated medically. He had elevated troponin assay at 4.5 on admission.   LDL 134, HDL 31, triglycerides 137, and cholesterol 192 total.   BRIEF HOSPITAL COURSE: Clinton Walsh is a 54 year old Caucasian male with past medical history significant for hyperlipidemia and tobacco use disorder and coronary artery disease with recent MI in March 2013 where he had a diagonal branch 100% stenosis requiring a bare metal stent. The patient has been noncompliant with his Plavix and also statin medications and presented with chest pain.  1. NSTEMI with elevated troponin secondary to in-stent thrombosis from noncompliance to his medications. Dr. Marlyne Walsh saw the patient. He was started on Integrilin drip in the hospital with ongoing chest pain. He had a cardiac catheterization which showed in-stent subacute thrombosis requiring thrombectomy and balloon angioplasty with only minimal residual thrombus left distal to the stent. He was put back on aspirin, Plavix and also pravastatin. He had intolerance to Lipitor in the past secondary to muscle cramps. He was strongly advised to be compliant to his medications, at least for the next three months, and also advised smoking cessation to prevent further in-stent thrombosis. He has been chest pain free and stable and is being discharged today.  2. Hyperlipidemia: As mentioned above, he had muscle cramps with Lipitor and Pravachol was started in the hospital, and he tolerated it well, so he will be discharged on the same.  3. Tobacco use disorder: He was strongly counseled against it. He says he is considering electronics cigarettes and will try to cut off cigarettes. His course has been otherwise uneventful in the  hospital.   DISCHARGE CONDITION: Stable.   DISCHARGE DISPOSITION: Home.   TIME SPENT ON DISCHARGE: 40 minutes. ____________________________ Clinton Lighter,  MD rk:slb D: 04/17/2011 14:07:08 ET T: 04/18/2011 12:14:56 ET JOB#: [id number]  cc: Clinton Lighter, MD, <Dictator> Clinton A. Fletcher Anon, MD Clinton Lighter MD ELECTRONICALLY SIGNED 04/20/2011 14:37

## 2014-04-25 NOTE — H&P (Signed)
PATIENT NAME:  Clinton Walsh, Clinton Walsh MR#:  [id number] DATE OF BIRTH:  1960-03-08  DATE OF ADMISSION:  04/14/2011  PRIMARY CARE PHYSICIAN: Patient has none.  CARDIOLOGIST: [facility]  CHIEF COMPLAINT: Chest pain.  HISTORY OF PRESENT ILLNESS: Clinton Walsh is a 54 year old male with recent diagnosis of myocardial infarction where patient was admitted at [facility] 03/16/2011 for complaints of chest pain typical to the one he had today where patient had cardiac catheterization done where he had a stent placed in his diagonal artery. Patient reports he has been having chest pain starting at 6:00 p.m. while he was sitting at rest. Patient's pain was intermittent in the right side of the chest, dull, radiating to the right shoulder. Patient reports pain has subsequently improved at this point. Patient also complains of nausea but denies any vomiting. Complains of sweating and shortness of breath, palpitations, weakness, dizziness. He reports symptoms improved after he took nitro and aspirin by paramedics. Patient reports he has not been taking his aspirin or Plavix for the last 15 days secondary to noncompliance as he basically forgets to take his medications. In ED patient had troponin done which was 0.03. He had EKG done as well without any significant ST or T wave changes.   PAST MEDICAL HISTORY: 1. Coronary artery disease with recent myocardial infarction status post stent 03/16/2011 in diagonal artery.  2. History of tobacco abuse.   PAST SURGICAL HISTORY:  1. Appendectomy. 2. Back surgery.  ALLERGIES: Sulfa drugs cause rash.   MEDICATIONS: Patient supposed to be on aspirin 81 mg oral daily, Plavix 75 mg oral daily but he has not been taking for the last two weeks.   SOCIAL HISTORY: Patient lives with wife. , currently unemployed. Smokes up to half a pack per day. History of smoking for last 30 years. Uses alcohol occasionally. No illicit drug use.   FAMILY HISTORY:  No coronary artery disease at young age.   REVIEW OF SYSTEMS: CONSTITUTIONAL: Denies any fever, fatigue. EYES: Denies any blurry vision, double vision or pain. ENT: Denies any tinnitus, ear pain, hearing loss, allergies. RESPIRATORY: Denies any cough, wheezing, hemoptysis. Had shortness of breath. CARDIOVASCULAR: Has complaints of chest pain. Denies any orthopnea, edema, syncope. GASTROINTESTINAL: Has complaints of nausea. Denies any vomiting, diarrhea, abdominal pain. GENITOURINARY: Denies any dysuria, hematuria, frequency, incontinence. ENDOCRINE: Denies any polyuria, polydipsia, heat or cold intolerance. HEMATOLOGY: Denies any easy bruising, bleeding diathesis or blood clots. NEUROLOGICAL: Denies any dysarthria, epilepsy, tremors, ataxia. PSYCH: Denies any alcohol or drug abuse. No history of depression.   PHYSICAL EXAMINATION: VITAL SIGNS: Temperature 98.2, pulse 67, respiratory rate 16, blood pressure 112/67, saturating 96% on room air.  GENERAL: Young male, comfortable in bed, no apparent distress.  HEENT: Head atraumatic, normocephalic. Pupils are equal, round, and reactive to light. Pink conjunctiva. Anicteric sclera. Moist oral mucosa.   NECK: Supple. No thyromegaly. No JVD.  CHEST: Good air entry bilaterally. No wheezing, rales, rhonchi.   CARDIOVASCULAR: S1, S2 heard. No rubs, murmurs, gallops.   ABDOMEN: Soft, nontender, nondistended. Bowel sounds present.   EXTREMITIES: No edema. No clubbing. No cyanosis.   NEUROLOGICAL: Cranial nerves grossly intact.   EXTREMITIES: Nonfocal.   PSYCH: Appropriate affect, awake, alert x3. Intact judgement and insight.   LABORATORY, DIAGNOSTIC AND RADIOLOGICAL DATA: Glucose 99, BUN 21, creatinine 1.27, sodium 138, potassium 3.7, chloride 104, carbon dioxide 26, calcium 8.7, AST 26, ALT 21, troponin 0.03, WBC 6, hemoglobin 14.1, hematocrit 41.7, platelets 237. EKG normal sinus rhythm at  63. No T wave inversion or ST changes.   ASSESSMENT AND  PLAN: 1. Chest pain. Patient has history of recent myocardial infarction status post stent placement. Patient is noncompliant with his medications, has not been taking his aspirin and Plavix. First set of cardiac enzymes negative but secondary to his recent history of myocardial infarction and noncompliance with Plavix and as patient is at high risk for restenosis of stent will resume patient back on aspirin and Plavix and will start him on treatment dose of Lovenox for acute coronary syndrome and will check lipid panel in a.m. Will cycle cardiac enzymes. Will consult cardiology service. Discussed with Dr. Fletcher Anon from [facility], will see patient in a.m. As well will check lipid panel to rule out any hyperlipidemia and any need of statin at this point. 2. Tobacco abuse. Patient was counseled. 3. CODE STATUS: Patient is FULL CODE.   total time spent for H`P and coordination of care 55 minutes. ____________________________ Albertine Patricia, MD dse:cms D: 04/14/2011 03:32:18 ET T: 04/14/2011 09:37:55 ET JOB#: [id number]  cc: Albertine Patricia, MD, <Dictator> Carolene Gitto Graciela Husbands MD ELECTRONICALLY SIGNED 04/15/2011 0:13

## 2014-04-25 NOTE — Consult Note (Signed)
PATIENT NAME:  Clinton Walsh, USELMAN MR#:  [id number] DATE OF BIRTH:  12/24/1960  DATE OF CONSULTATION:  04/14/2011  REFERRING PHYSICIAN:  Dr. Emeline Gins Elgergawy  CONSULTING PHYSICIAN:  Rogue Jury A. Fletcher Anon, MD  CARDIOLOGIST: [place]  REASON FOR CONSULTATION: Non-ST elevation myocardial infarction.   HISTORY OF PRESENT ILLNESS: Clinton Walsh is a 54 year old gentleman who was recently treated at [facility] on 03/16/2011. He presented there with chest pain and was found to have anterolateral myocardial infarction. Cardiac catheterization showed an occluded diagonal branch which was treated with an angioplasty and bare metal stent placement. The patient was discharged home. He took Plavix and Lipitor for a few days, however, he stopped taking the medications. He stopped Lipitor due to cramps in his legs. He also did not want to take Plavix. He was taking aspirin intermittently. He continues to smoke. He presented last night with chest pain which started around 9:00 p.m. It was very similar to his previous myocardial infarction but much less intense. It lasted for a few hours. His initial cardiac enzymes were negative, however, subsequently troponin came back elevated and most recent one was 4. His chest pain actually resolved this morning but had some more discomfort this afternoon. He is currently having 1/10 chest pain.   PAST MEDICAL HISTORY:  1. Coronary artery disease status post recent anterolateral myocardial infarction. Cardiac catheterization showed an occluded diagonal branch of the LAD. The rest of the coronary arteries had minor irregularities without obstructive disease. He had angioplasty and bare metal stent placement with 2.25 mini Vision x 18 mm.  2. Tobacco use.  3. Hyperlipidemia.   PAST SURGICAL HISTORY:  1. Appendectomy.  2. Back surgery.   ALLERGIES: Sulfa drugs.   HOME MEDICATIONS: He was supposed to be on aspirin 81 mg daily, Plavix 75 mg daily and atorvastatin 80  mg daily, however, he has not been taking these medications.   SOCIAL HISTORY: He is currently unemployed. He is married and lives with his wife. He smokes half pack per day and has been smoking for more than 30 years. He drinks alcohol occasionally. He denies any recreational drug use.   FAMILY HISTORY: Negative for premature coronary artery disease.   REVIEW OF SYSTEMS: A 10 point review of systems was performed. It is negative other than what is mentioned in the history of present illness.   PHYSICAL EXAMINATION:  GENERAL: Patient appears to be at his stated age and in no acute distress.   VITAL SIGNS: Temperature 98, pulse 47, respiratory rate 16, blood pressure 117/75, oxygen saturation 98% on 2 liters nasal cannula.   HEENT: Normocephalic, atraumatic.   NECK: No jugular venous distention or carotid bruits.   RESPIRATORY: Normal respiratory effort with no use of accessory muscles. Auscultation reveals normal breath sounds.   CARDIOVASCULAR: Normal PMI. Normal S1 and S2 with no gallops or murmurs.   ABDOMEN: Benign, nontender, nondistended.   EXTREMITIES: No clubbing, cyanosis, or edema.   SKIN: Warm and dry with no rash. The patient has multiple tattoos.   PSYCHIATRIC: He is alert, oriented x3 with normal mood and affect.   LABORATORY, DIAGNOSTIC, AND RADIOLOGICAL DATA: ECG showed normal sinus rhythm with no significant ST or T wave changes. His creatinine was 1.27. Initial troponin was 0.03 which went up to 1.81 and most recent one was 4.5. CK-MB was 47.6. Hemoglobin was 14.1.   IMPRESSION:  1. Non-ST elevation myocardial infarction, likely due to subacute stent thrombosis.  2. Tobacco use.  3. Hyperlipidemia with intolerance  to atorvastatin.  4. Bradycardia.   RECOMMENDATIONS: The patient has evidence of non-ST elevation myocardial infarction. His recent cardiac catheterization last month showed an occluded diagonal which was treated with bare metal stent placement. Given  his noncompliance with dual antiplatelet therapy, I suspect that he has subacute stent thrombosis. He has very minor chest pain at this time. I recommend treatment with nitroglycerin. I agree with resuming aspirin and Plavix. I will also continue low molecular weight heparin. I will go ahead and add Integrilin drip. If the patient continues to have chest pain in spite of the above treatment, he might need to be transferred for urgent cardiac catheterization. However, if his chest discomfort improves, will plan on doing the procedure on Monday. I had a prolonged discussion with the patient and his family about the importance of compliance with medical therapy. He seems now to understand and he is willing to take Plavix. He said that he could not tolerate atorvastatin due to myalgia. I agree with switching him to pravastatin.   ____________________________ Mertie Clause. Fletcher Anon, MD maa:cms D: 04/14/2011 16:04:43 ET T: 04/15/2011 10:33:21 ET JOB#: [id number]  cc: Adolphe Fortunato A. Fletcher Anon, MD, <Dictator> Wellington Hampshire MD ELECTRONICALLY SIGNED 04/21/2011 14:09

## 2014-04-25 NOTE — Consult Note (Signed)
Brief Consult Note: Diagnosis: NSTEMI.   Patient was seen by consultant.   Consult note dictated.   Recommend to proceed with surgery or procedure.   Orders entered.   Discussed with Attending MD.   Comments: Had an anterolateral MI on 03/16/11 treated with BMS to diagonal. Has not been taking Plavix and Lipitor.  Presented with NSTEMI. ECG no acute changes. TnI 4. Mild chest pain.  Continue Aspirin, Plavix and Lovenox.  Add Integrillin.  Plan cardiac cath on Monday or earlier if CP does not resolve with NTG/Integrillin.  Electronic Signatures: Kathlyn Sacramento (MD)  (Signed 13-Apr-13 15:55)  Authored: Brief Consult Note   Last Updated: 13-Apr-13 15:55 by Kathlyn Sacramento (MD)

## 2015-01-24 ENCOUNTER — Emergency Department
Admission: EM | Admit: 2015-01-24 | Discharge: 2015-01-24 | Disposition: A | Discharge status: Home / Self Care | Payer: Managed Care, Other (non HMO) | Attending: Emergency Medicine | Admitting: Emergency Medicine

## 2015-01-24 ENCOUNTER — Encounter: Payer: Self-pay | Admitting: Emergency Medicine

## 2015-01-24 DIAGNOSIS — M25542 Pain in joints of left hand: Secondary | ICD-10-CM | POA: Insufficient documentation

## 2015-01-24 DIAGNOSIS — R2233 Localized swelling, mass and lump, upper limb, bilateral: Secondary | ICD-10-CM | POA: Diagnosis not present

## 2015-01-24 DIAGNOSIS — Z79899 Other long term (current) drug therapy: Secondary | ICD-10-CM | POA: Insufficient documentation

## 2015-01-24 DIAGNOSIS — M2550 Pain in unspecified joint: Secondary | ICD-10-CM

## 2015-01-24 DIAGNOSIS — F17210 Nicotine dependence, cigarettes, uncomplicated: Secondary | ICD-10-CM | POA: Insufficient documentation

## 2015-01-24 DIAGNOSIS — M25541 Pain in joints of right hand: Secondary | ICD-10-CM | POA: Insufficient documentation

## 2015-01-24 MED ORDER — TRAMADOL HCL 50 MG PO TABS: 50.0000 mg | ORAL_TABLET | Freq: Four times a day (QID) | ORAL | Status: DC | PRN

## 2015-01-24 MED ORDER — KETOROLAC TROMETHAMINE 60 MG/2ML IM SOLN
60.0000 mg | Freq: Once | INTRAMUSCULAR | Status: AC
  Administered 2015-01-24: 60 mg via INTRAMUSCULAR
  Filled 2015-01-24: qty 2

## 2015-01-24 MED ORDER — MELOXICAM 15 MG PO TABS: 15.0000 mg | ORAL_TABLET | Freq: Every day | ORAL | Status: DC

## 2015-01-24 MED ORDER — KETOROLAC TROMETHAMINE 60 MG/2ML IM SOLN
INTRAMUSCULAR | Status: AC
  Filled 2015-01-24: qty 2

## 2015-01-24 NOTE — ED Notes (Signed)
Pt to ed with c/o joint pain and aches x 3 weeks.  Pt states he has noticed increased swelling in his hands.  Denies injury.  Denies PMD.
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
## 2015-01-24 NOTE — Discharge Instructions (Signed)
Joint Pain  Joint pain can be caused by many things. The joint can be bruised, infected, weak from aging, or sore from exercise. The pain will probably go away if you follow your doctor's instructions for home care. If your joint pain continues, more tests may be needed to help find the cause of your condition.  HOME CARE  Watch your condition for any changes. Follow these instructions as told to lessen the pain that you are feeling:  · Take medicines only as told by your doctor.  · Rest the sore joint for as long as told by your doctor. If your doctor tells you to, raise (elevate) the painful joint above the level of your heart while you are sitting or lying down.  · Do not do things that cause pain or make the pain worse.  · If told, put ice on the painful area:    Put ice in a plastic bag.    Place a towel between your skin and the bag.    Leave the ice on for 20 minutes, 2-3 times per day.  · Wear an elastic bandage, splint, or sling as told by your doctor. Loosen the bandage or splint if your fingers or toes lose feeling (become numb) and tingle, or if they turn cold and blue.  · Begin exercising or stretching the joint as told by your doctor. Ask your doctor what types of exercise are safe for you.  · Keep all follow-up visits as told by your doctor. This is important.  GET HELP IF:  · Your pain gets worse and medicine does not help it.  · Your joint pain does not get better in 3 days.  · You have more bruising or swelling.  · You have a fever.  · You lose 10 pounds (4.5 kg) or more without trying.  GET HELP RIGHT AWAY IF:  · You are not able to move the joint.  · Your fingers or toes become numb or they turn cold and blue.     This information is not intended to replace advice given to you by your health care provider. Make sure you discuss any questions you have with your health care provider.     Document Released: 12/06/2008 Document Revised: 01/08/2014 Document Reviewed: 09/29/2013  [employer]vier Interactive  Patient Education ©2016 [employer]

## 2015-02-01 ENCOUNTER — Ambulatory Visit
Admission: EM | Admit: 2015-02-01 | Discharge: 2015-02-01 | Disposition: A | Discharge status: Home / Self Care | Payer: Managed Care, Other (non HMO) | Attending: Family Medicine | Admitting: Family Medicine

## 2015-02-01 ENCOUNTER — Encounter: Payer: Self-pay | Admitting: *Deleted

## 2015-02-01 DIAGNOSIS — F17210 Nicotine dependence, cigarettes, uncomplicated: Secondary | ICD-10-CM | POA: Insufficient documentation

## 2015-02-01 DIAGNOSIS — I2510 Atherosclerotic heart disease of native coronary artery without angina pectoris: Secondary | ICD-10-CM | POA: Diagnosis not present

## 2015-02-01 DIAGNOSIS — I10 Essential (primary) hypertension: Secondary | ICD-10-CM | POA: Diagnosis not present

## 2015-02-01 DIAGNOSIS — R531 Weakness: Secondary | ICD-10-CM | POA: Diagnosis not present

## 2015-02-01 DIAGNOSIS — I252 Old myocardial infarction: Secondary | ICD-10-CM | POA: Diagnosis not present

## 2015-02-01 DIAGNOSIS — Z720 Tobacco use: Secondary | ICD-10-CM

## 2015-02-01 DIAGNOSIS — R52 Pain, unspecified: Secondary | ICD-10-CM | POA: Diagnosis present

## 2015-02-01 LAB — RAPID STREP SCREEN (MED CTR MEBANE ONLY): Streptococcus, Group A Screen (Direct): NEGATIVE
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
## 2015-02-01 NOTE — Discharge Instructions (Signed)
Weakness Weakness is a lack of strength. It may be felt all over the body (generalized) or in one specific part of the body (focal). Some causes of weakness can be serious. You may need further medical evaluation, especially if you are elderly or you have a history of immunosuppression (such as chemotherapy or HIV), kidney disease, heart disease, or diabetes. CAUSES  Weakness can be caused by many different things, including:  Infection.  Physical exhaustion.  Internal bleeding or other blood loss that results in a lack of red blood cells (anemia).  Dehydration. This cause is more common in elderly people.  Side effects or electrolyte abnormalities from medicines, such as pain medicines or sedatives.  Emotional distress, anxiety, or depression.  Circulation problems, especially severe peripheral arterial disease.  Heart disease, such as rapid atrial fibrillation, bradycardia, or heart failure.  Nervous system disorders, such as Guillain-Barr syndrome, multiple sclerosis, or stroke. DIAGNOSIS  To find the cause of your weakness, your caregiver will take your history and perform a physical exam. Lab tests or X-rays may also be ordered, if needed. TREATMENT  Treatment of weakness depends on the cause of your symptoms and can vary greatly. HOME CARE INSTRUCTIONS   Rest as needed.  Eat a well-balanced diet.  Try to get some exercise every day.  Only take over-the-counter or prescription medicines as directed by your caregiver. SEEK MEDICAL CARE IF:   Your weakness seems to be getting worse or spreads to other parts of your body.  You develop new aches or pains. SEEK IMMEDIATE MEDICAL CARE IF:   You cannot perform your normal daily activities, such as getting dressed and feeding yourself.  You cannot walk up and down stairs, or you feel exhausted when you do so.  You have shortness of breath or chest pain.  You have difficulty moving parts of your body.  You have weakness  in only one area of the body or on only one side of the body.  You have a fever.  You have trouble speaking or swallowing.  You cannot control your bladder or bowel movements.  You have black or bloody vomit or stools. MAKE SURE YOU:  Understand these instructions.  Will watch your condition.  Will get help right away if you are not doing well or get worse.   This information is not intended to replace advice given to you by your health care provider. Make sure you discuss any questions you have with your health care provider.   Document Released: 12/18/2004 Document Revised: 06/19/2011 Document Reviewed: 02/16/2011 Elsevier Interactive Patient Education 2016 [employer].  Fatigue Fatigue is feeling tired all of the time, a lack of energy, or a lack of motivation. Occasional or mild fatigue is often a normal response to activity or life in general. However, long-lasting (chronic) or extreme fatigue may indicate an underlying medical condition. HOME CARE INSTRUCTIONS  Watch your fatigue for any changes. The following actions may help to lessen any discomfort you are feeling:  Talk to your health care provider about how much sleep you need each night. Try to get the required amount every night.  Take medicines only as directed by your health care provider.  Eat a healthy and nutritious diet. Ask your health care provider if you need help changing your diet.  Drink enough fluid to keep your urine clear or pale yellow.  Practice ways of relaxing, such as yoga, meditation, massage therapy, or acupuncture.  Exercise regularly.   Change situations that cause you  stress. Try to keep your work and personal routine reasonable.  Do not abuse illegal drugs.  Limit alcohol intake to no more than 1 drink per day for nonpregnant women and 2 drinks per day for men. One drink equals 12 ounces of beer, 5 ounces of wine, or 1 ounces of hard liquor.  Take a multivitamin, if directed by  your health care provider. SEEK MEDICAL CARE IF:   Your fatigue does not get better.  You have a fever.   You have unintentional weight loss or gain.  You have headaches.   You have difficulty:   Falling asleep.  Sleeping throughout the night.  You feel angry, guilty, anxious, or sad.   You are unable to have a bowel movement (constipation).   You skin is dry.   Your legs or another part of your body is swollen.  SEEK IMMEDIATE MEDICAL CARE IF:   You feel confused.   Your vision is blurry.  You feel faint or pass out.   You have a severe headache.   You have severe abdominal, pelvic, or back pain.   You have chest pain, shortness of breath, or an irregular or fast heartbeat.   You are unable to urinate or you urinate less than normal.   You develop abnormal bleeding, such as bleeding from the rectum, vagina, nose, lungs, or nipples.  You vomit blood.   You have thoughts about harming yourself or committing suicide.   You are worried that you might harm someone else.    This information is not intended to replace advice given to you by your health care provider. Make sure you discuss any questions you have with your health care provider.   Document Released: 10/15/2006 Document Revised: 01/08/2014 Document Reviewed: 04/21/2013 Elsevier Interactive Patient Education [employer].

## 2015-02-01 NOTE — ED Notes (Signed)
Pt states that he was seen in the ED last week for joint pains, Pt states that he has been having joint pains in his hands for several months, pain has spread all through his body, shoulders, knees, hands, over the past few weeks.

## 2015-02-02 DIAGNOSIS — I2510 Atherosclerotic heart disease of native coronary artery without angina pectoris: Secondary | ICD-10-CM | POA: Insufficient documentation

## 2015-02-02 DIAGNOSIS — M353 Polymyalgia rheumatica: Secondary | ICD-10-CM

## 2015-02-02 DIAGNOSIS — J431 Panlobular emphysema: Secondary | ICD-10-CM | POA: Insufficient documentation

## 2015-02-02 DIAGNOSIS — I25119 Atherosclerotic heart disease of native coronary artery with unspecified angina pectoris: Secondary | ICD-10-CM | POA: Insufficient documentation

## 2015-02-02 DIAGNOSIS — K219 Gastro-esophageal reflux disease without esophagitis: Secondary | ICD-10-CM | POA: Insufficient documentation

## 2015-02-02 DIAGNOSIS — M2550 Pain in unspecified joint: Secondary | ICD-10-CM | POA: Insufficient documentation

## 2015-02-02 DIAGNOSIS — I25118 Atherosclerotic heart disease of native coronary artery with other forms of angina pectoris: Secondary | ICD-10-CM | POA: Insufficient documentation

## 2015-02-02 DIAGNOSIS — Z720 Tobacco use: Secondary | ICD-10-CM | POA: Insufficient documentation

## 2015-02-02 HISTORY — DX: Polymyalgia rheumatica: M35.3

## 2015-02-03 DIAGNOSIS — M353 Polymyalgia rheumatica: Secondary | ICD-10-CM | POA: Insufficient documentation

## 2015-02-04 LAB — CULTURE, GROUP A STREP (THRC)

## 2015-02-07 ENCOUNTER — Encounter: Payer: Self-pay | Admitting: Internal Medicine

## 2015-02-08 ENCOUNTER — Ambulatory Visit (INDEPENDENT_AMBULATORY_CARE_PROVIDER_SITE_OTHER): Payer: Managed Care, Other (non HMO) | Admitting: Internal Medicine

## 2015-02-08 ENCOUNTER — Encounter: Payer: Self-pay | Admitting: Internal Medicine

## 2015-02-08 ENCOUNTER — Other Ambulatory Visit: Payer: Self-pay | Admitting: Internal Medicine

## 2015-02-08 VITALS — BP 112/78 | HR 84 | Ht 65.0 in | Wt 120.0 lb

## 2015-02-08 DIAGNOSIS — L738 Other specified follicular disorders: Secondary | ICD-10-CM

## 2015-02-08 DIAGNOSIS — M353 Polymyalgia rheumatica: Secondary | ICD-10-CM

## 2015-02-08 DIAGNOSIS — Z720 Tobacco use: Secondary | ICD-10-CM

## 2015-02-08 DIAGNOSIS — I2510 Atherosclerotic heart disease of native coronary artery without angina pectoris: Secondary | ICD-10-CM

## 2015-02-08 DIAGNOSIS — J431 Panlobular emphysema: Secondary | ICD-10-CM

## 2015-02-08 MED ORDER — PREDNISONE 10 MG PO TABS: 40.0000 mg | ORAL_TABLET | Freq: Every day | ORAL | Status: DC

## 2015-02-08 MED ORDER — MUPIROCIN CALCIUM 2 % EX CREA: 1.0000 "application " | TOPICAL_CREAM | Freq: Two times a day (BID) | CUTANEOUS | Status: DC
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
## 2015-02-08 NOTE — Patient Instructions (Signed)
Prednisone 10 mg -    Take 4 pills per day for 2 weeks then 3 pills per day for 2 weeks then return here for follow-up.

## 2015-03-08 ENCOUNTER — Ambulatory Visit (INDEPENDENT_AMBULATORY_CARE_PROVIDER_SITE_OTHER): Payer: Managed Care, Other (non HMO) | Admitting: Internal Medicine

## 2015-03-08 ENCOUNTER — Encounter: Payer: Self-pay | Admitting: Internal Medicine

## 2015-03-08 VITALS — BP 118/64 | HR 72 | Ht 65.0 in | Wt 120.6 lb

## 2015-03-08 DIAGNOSIS — K219 Gastro-esophageal reflux disease without esophagitis: Secondary | ICD-10-CM

## 2015-03-08 DIAGNOSIS — M353 Polymyalgia rheumatica: Secondary | ICD-10-CM | POA: Diagnosis not present

## 2015-03-08 DIAGNOSIS — J431 Panlobular emphysema: Secondary | ICD-10-CM

## 2015-03-08 DIAGNOSIS — L739 Follicular disorder, unspecified: Secondary | ICD-10-CM | POA: Diagnosis not present

## 2015-03-08 MED ORDER — FAMOTIDINE 20 MG PO TABS: ORAL_TABLET | ORAL | Status: DC
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
## 2015-03-09 ENCOUNTER — Telehealth: Payer: Self-pay

## 2015-03-09 LAB — SEDIMENTATION RATE: Sed Rate: 7 mm/hr (ref 0–30)

## 2015-03-09 LAB — BASIC METABOLIC PANEL
BUN / CREAT RATIO: 21 — AB (ref 9–20)
BUN: 23 mg/dL (ref 6–24)
CALCIUM: 9.7 mg/dL (ref 8.7–10.2)
CHLORIDE: 97 mmol/L (ref 96–106)
CO2: 22 mmol/L (ref 18–29)
Creatinine, Ser: 1.08 mg/dL (ref 0.76–1.27)
GFR calc non Af Amer: 78 mL/min/{1.73_m2} (ref >59)
GFR, EST AFRICAN AMERICAN: 91 mL/min/{1.73_m2} (ref >59)
Glucose: 108 mg/dL — ABNORMAL HIGH (ref 65–99)
POTASSIUM: 5.1 mmol/L (ref 3.5–5.2)
SODIUM: 139 mmol/L (ref 134–144)

## 2015-03-09 NOTE — Telephone Encounter (Signed)
Left message for patient to call back  

## 2015-03-09 NOTE — Telephone Encounter (Signed)
-----   Message from Glean Hess, MD sent at 03/09/2015  8:06 AM EST ----- Sed Rate is normal now.  Reduce prednisone dose to 25 mg per day (2 and 1/2 tablets).  Make follow up appt for 6 weeks.

## 2015-03-10 NOTE — Telephone Encounter (Signed)
Left message for patient to call back  

## 2015-03-24 NOTE — Telephone Encounter (Signed)
Left message for patient to call back  

## 2015-03-29 ENCOUNTER — Other Ambulatory Visit: Payer: Self-pay

## 2015-03-29 DIAGNOSIS — M353 Polymyalgia rheumatica: Secondary | ICD-10-CM

## 2015-03-29 MED ORDER — PREDNISONE 10 MG PO TABS
25.0000 mg | ORAL_TABLET | Freq: Every day | ORAL | Status: DC
Start: 2015-03-29 — End: 2015-08-29

## 2015-03-29 NOTE — Telephone Encounter (Signed)
Patient states that he has run out of prednisone. He is currently taking 2.'5mg'$  tablets.

## 2015-08-29 ENCOUNTER — Encounter: Payer: Self-pay | Admitting: Internal Medicine

## 2015-08-29 ENCOUNTER — Ambulatory Visit (INDEPENDENT_AMBULATORY_CARE_PROVIDER_SITE_OTHER): Payer: Managed Care, Other (non HMO) | Admitting: Internal Medicine

## 2015-08-29 VITALS — BP 122/72 | HR 64 | Ht 65.0 in | Wt 123.0 lb

## 2015-08-29 DIAGNOSIS — M353 Polymyalgia rheumatica: Secondary | ICD-10-CM

## 2015-08-29 DIAGNOSIS — F17201 Nicotine dependence, unspecified, in remission: Secondary | ICD-10-CM

## 2015-08-29 DIAGNOSIS — Z720 Tobacco use: Secondary | ICD-10-CM | POA: Diagnosis not present

## 2015-08-29 MED ORDER — PREDNISONE 10 MG PO TABS: 10.0000 mg | ORAL_TABLET | Freq: Every day | ORAL | 1 refills | Status: DC
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
## 2015-08-30 LAB — BASIC METABOLIC PANEL
BUN / CREAT RATIO: 14 (ref 9–20)
BUN: 14 mg/dL (ref 6–24)
CO2: 23 mmol/L (ref 18–29)
CREATININE: 1.01 mg/dL (ref 0.76–1.27)
Calcium: 8.9 mg/dL (ref 8.7–10.2)
Chloride: 102 mmol/L (ref 96–106)
GFR calc Af Amer: 98 mL/min/{1.73_m2} (ref >59)
GFR calc non Af Amer: 85 mL/min/{1.73_m2} (ref >59)
GLUCOSE: 63 mg/dL — AB (ref 65–99)
Potassium: 4.2 mmol/L (ref 3.5–5.2)
SODIUM: 142 mmol/L (ref 134–144)

## 2015-08-30 LAB — SEDIMENTATION RATE: SED RATE: 16 mm/h (ref 0–30)

## 2015-09-16 ENCOUNTER — Emergency Department
Admission: EM | Admit: 2015-09-16 | Discharge: 2015-09-16 | Disposition: A | Discharge status: Home / Self Care | Payer: Managed Care, Other (non HMO) | Attending: Emergency Medicine | Admitting: Emergency Medicine

## 2015-09-16 ENCOUNTER — Encounter: Payer: Self-pay | Admitting: Emergency Medicine

## 2015-09-16 DIAGNOSIS — Y999 Unspecified external cause status: Secondary | ICD-10-CM | POA: Diagnosis not present

## 2015-09-16 DIAGNOSIS — I2510 Atherosclerotic heart disease of native coronary artery without angina pectoris: Secondary | ICD-10-CM | POA: Insufficient documentation

## 2015-09-16 DIAGNOSIS — Z79899 Other long term (current) drug therapy: Secondary | ICD-10-CM | POA: Diagnosis not present

## 2015-09-16 DIAGNOSIS — Y929 Unspecified place or not applicable: Secondary | ICD-10-CM | POA: Diagnosis not present

## 2015-09-16 DIAGNOSIS — Z7982 Long term (current) use of aspirin: Secondary | ICD-10-CM | POA: Insufficient documentation

## 2015-09-16 DIAGNOSIS — Z87891 Personal history of nicotine dependence: Secondary | ICD-10-CM | POA: Insufficient documentation

## 2015-09-16 DIAGNOSIS — W228XXA Striking against or struck by other objects, initial encounter: Secondary | ICD-10-CM | POA: Insufficient documentation

## 2015-09-16 DIAGNOSIS — S0502XA Injury of conjunctiva and corneal abrasion without foreign body, left eye, initial encounter: Secondary | ICD-10-CM | POA: Diagnosis not present

## 2015-09-16 DIAGNOSIS — T1502XA Foreign body in cornea, left eye, initial encounter: Secondary | ICD-10-CM | POA: Diagnosis present

## 2015-09-16 DIAGNOSIS — T1592XA Foreign body on external eye, part unspecified, left eye, initial encounter: Secondary | ICD-10-CM

## 2015-09-16 DIAGNOSIS — Y9389 Activity, other specified: Secondary | ICD-10-CM | POA: Insufficient documentation

## 2015-09-16 MED ORDER — KETOROLAC TROMETHAMINE 0.4 % OP SOLN: 1.0000 [drp] | Freq: Four times a day (QID) | OPHTHALMIC | 0 refills | Status: DC

## 2015-09-16 MED ORDER — FLUORESCEIN SODIUM 1 MG OP STRP
ORAL_STRIP | OPHTHALMIC | Status: DC
Start: 2015-09-16 — End: 2015-09-16
  Filled 2015-09-16: qty 2

## 2015-09-16 MED ORDER — KETOROLAC TROMETHAMINE 0.5 % OP SOLN
1.0000 [drp] | Freq: Four times a day (QID) | OPHTHALMIC | Status: DC
  Administered 2015-09-16: 1 [drp] via OPHTHALMIC
  Filled 2015-09-16: qty 3

## 2015-09-16 MED ORDER — CIPROFLOXACIN HCL 0.3 % OP SOLN: 1.0000 [drp] | OPHTHALMIC | 0 refills | Status: AC

## 2015-09-16 MED ORDER — TETRACAINE HCL 0.5 % OP SOLN
2.0000 [drp] | Freq: Once | OPHTHALMIC | Status: AC
  Administered 2015-09-16: 2 [drp] via OPHTHALMIC
  Filled 2015-09-16: qty 2

## 2015-09-16 MED ORDER — OXYCODONE-ACETAMINOPHEN 5-325 MG PO TABS
1.0000 | ORAL_TABLET | Freq: Once | ORAL | Status: DC
  Filled 2015-09-16: qty 1

## 2015-09-16 MED ORDER — HYDROCODONE-ACETAMINOPHEN 5-325 MG PO TABS
1.0000 | ORAL_TABLET | ORAL | Status: AC
  Administered 2015-09-16: 1 via ORAL
  Filled 2015-09-16: qty 1

## 2015-09-16 MED ORDER — FLUORESCEIN SODIUM 1 MG OP STRP
1.0000 | ORAL_STRIP | Freq: Once | OPHTHALMIC | Status: DC
  Filled 2015-09-16: qty 1

## 2015-09-16 MED ORDER — CIPROFLOXACIN HCL 0.3 % OP SOLN
2.0000 [drp] | OPHTHALMIC | Status: DC
  Administered 2015-09-16: 2 [drp] via OPHTHALMIC
  Filled 2015-09-16: qty 2.5

## 2015-09-16 MED ORDER — HYDROCODONE-ACETAMINOPHEN 5-325 MG PO TABS: 1.0000 | ORAL_TABLET | Freq: Four times a day (QID) | ORAL | 0 refills | Status: DC | PRN
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
## 2015-09-16 NOTE — Discharge Instructions (Signed)
Please use medications as prescribed. Follow-up with ophthalmologist 8:45 AM tomorrow morning. Return to the ER for any worsening symptoms urgent changes in her health

## 2015-09-16 NOTE — ED Triage Notes (Signed)
Pt with left eye pain for two days, feels like there is sand in his eye.

## 2015-09-26 ENCOUNTER — Encounter: Payer: Self-pay | Admitting: Internal Medicine

## 2015-09-26 ENCOUNTER — Ambulatory Visit (INDEPENDENT_AMBULATORY_CARE_PROVIDER_SITE_OTHER): Payer: Managed Care, Other (non HMO) | Admitting: Internal Medicine

## 2015-09-26 VITALS — BP 140/98 | HR 72 | Ht 65.0 in | Wt 122.0 lb

## 2015-09-26 DIAGNOSIS — M19012 Primary osteoarthritis, left shoulder: Secondary | ICD-10-CM

## 2015-09-26 DIAGNOSIS — K219 Gastro-esophageal reflux disease without esophagitis: Secondary | ICD-10-CM | POA: Diagnosis not present

## 2015-09-26 DIAGNOSIS — M353 Polymyalgia rheumatica: Secondary | ICD-10-CM
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
## 2015-09-27 LAB — SEDIMENTATION RATE: SED RATE: 13 mm/h (ref 0–30)

## 2016-04-10 ENCOUNTER — Encounter
Admission: EM | Disposition: A | Discharge status: Home / Self Care | Payer: Self-pay | Source: Home / Self Care | Attending: Internal Medicine

## 2016-04-10 ENCOUNTER — Inpatient Hospital Stay (HOSPITAL_COMMUNITY)
Admit: 2016-04-10 | Discharge: 2016-04-10 | Disposition: A | Discharge status: Home / Self Care | Payer: Managed Care, Other (non HMO) | Attending: Internal Medicine | Admitting: Internal Medicine

## 2016-04-10 ENCOUNTER — Emergency Department: Payer: Managed Care, Other (non HMO)

## 2016-04-10 ENCOUNTER — Inpatient Hospital Stay
Admission: EM | Admit: 2016-04-10 | Discharge: 2016-04-12 | DRG: 247 | Disposition: A | Discharge status: Home / Self Care | Payer: Managed Care, Other (non HMO) | Attending: Internal Medicine | Admitting: Internal Medicine

## 2016-04-10 DIAGNOSIS — R591 Generalized enlarged lymph nodes: Secondary | ICD-10-CM

## 2016-04-10 DIAGNOSIS — Z955 Presence of coronary angioplasty implant and graft: Secondary | ICD-10-CM

## 2016-04-10 DIAGNOSIS — M353 Polymyalgia rheumatica: Secondary | ICD-10-CM | POA: Diagnosis present

## 2016-04-10 DIAGNOSIS — R079 Chest pain, unspecified: Secondary | ICD-10-CM

## 2016-04-10 DIAGNOSIS — F17290 Nicotine dependence, other tobacco product, uncomplicated: Secondary | ICD-10-CM | POA: Diagnosis present

## 2016-04-10 DIAGNOSIS — I2111 ST elevation (STEMI) myocardial infarction involving right coronary artery: Principal | ICD-10-CM | POA: Diagnosis present

## 2016-04-10 DIAGNOSIS — Z79899 Other long term (current) drug therapy: Secondary | ICD-10-CM | POA: Diagnosis not present

## 2016-04-10 DIAGNOSIS — I209 Angina pectoris, unspecified: Secondary | ICD-10-CM | POA: Diagnosis not present

## 2016-04-10 DIAGNOSIS — I513 Intracardiac thrombosis, not elsewhere classified: Secondary | ICD-10-CM | POA: Diagnosis present

## 2016-04-10 DIAGNOSIS — I252 Old myocardial infarction: Secondary | ICD-10-CM

## 2016-04-10 DIAGNOSIS — I2510 Atherosclerotic heart disease of native coronary artery without angina pectoris: Secondary | ICD-10-CM | POA: Diagnosis present

## 2016-04-10 DIAGNOSIS — I10 Essential (primary) hypertension: Secondary | ICD-10-CM | POA: Diagnosis present

## 2016-04-10 DIAGNOSIS — E785 Hyperlipidemia, unspecified: Secondary | ICD-10-CM | POA: Diagnosis present

## 2016-04-10 DIAGNOSIS — I200 Unstable angina: Secondary | ICD-10-CM

## 2016-04-10 DIAGNOSIS — I213 ST elevation (STEMI) myocardial infarction of unspecified site: Secondary | ICD-10-CM | POA: Diagnosis present

## 2016-04-10 DIAGNOSIS — I255 Ischemic cardiomyopathy: Secondary | ICD-10-CM | POA: Diagnosis present

## 2016-04-10 DIAGNOSIS — R918 Other nonspecific abnormal finding of lung field: Secondary | ICD-10-CM

## 2016-04-10 DIAGNOSIS — I2119 ST elevation (STEMI) myocardial infarction involving other coronary artery of inferior wall: Secondary | ICD-10-CM

## 2016-04-10 DIAGNOSIS — I2109 ST elevation (STEMI) myocardial infarction involving other coronary artery of anterior wall: Secondary | ICD-10-CM

## 2016-04-10 HISTORY — PX: INTRAVASCULAR ULTRASOUND/IVUS: CATH118244

## 2016-04-10 HISTORY — DX: ST elevation (STEMI) myocardial infarction involving other coronary artery of inferior wall: I21.19

## 2016-04-10 HISTORY — PX: LEFT HEART CATH AND CORONARY ANGIOGRAPHY: CATH118249

## 2016-04-10 HISTORY — DX: ST elevation (STEMI) myocardial infarction involving right coronary artery: I21.11

## 2016-04-10 HISTORY — PX: CORONARY STENT INTERVENTION: CATH118234

## 2016-04-10 LAB — CBC
HCT: 44.3 % (ref 40.0–52.0)
Hemoglobin: 15.3 g/dL (ref 13.0–18.0)
MCH: 30.4 pg (ref 26.0–34.0)
MCHC: 34.6 g/dL (ref 32.0–36.0)
MCV: 87.7 fL (ref 80.0–100.0)
PLATELETS: 260 10*3/uL (ref 150–440)
RBC: 5.05 MIL/uL (ref 4.40–5.90)
RDW: 13.6 % (ref 11.5–14.5)
WBC: 7.1 10*3/uL (ref 3.8–10.6)

## 2016-04-10 LAB — TROPONIN I
TROPONIN I: 0.06 ng/mL — AB (ref <0.03)
Troponin I: 1.14 ng/mL (ref <0.03)
Troponin I: 4.35 ng/mL (ref <0.03)

## 2016-04-10 LAB — MRSA PCR SCREENING: MRSA by PCR: NEGATIVE

## 2016-04-10 LAB — POCT ACTIVATED CLOTTING TIME: ACTIVATED CLOTTING TIME: 246 s

## 2016-04-10 LAB — COMPREHENSIVE METABOLIC PANEL
ALT: 14 U/L — ABNORMAL LOW (ref 17–63)
ANION GAP: 9 (ref 5–15)
AST: 24 U/L (ref 15–41)
Albumin: 4.2 g/dL (ref 3.5–5.0)
Alkaline Phosphatase: 63 U/L (ref 38–126)
BUN: 20 mg/dL (ref 6–20)
CHLORIDE: 106 mmol/L (ref 101–111)
CO2: 22 mmol/L (ref 22–32)
Calcium: 9.3 mg/dL (ref 8.9–10.3)
Creatinine, Ser: 0.92 mg/dL (ref 0.61–1.24)
GFR calc Af Amer: >60 mL/min (ref >60)
Glucose, Bld: 95 mg/dL (ref 65–99)
POTASSIUM: 3.8 mmol/L (ref 3.5–5.1)
Sodium: 137 mmol/L (ref 135–145)
Total Bilirubin: 0.4 mg/dL (ref 0.3–1.2)
Total Protein: 8.2 g/dL — ABNORMAL HIGH (ref 6.5–8.1)

## 2016-04-10 LAB — PLATELET COUNT: Platelets: 229 10*3/uL (ref 150–440)

## 2016-04-10 LAB — PROTIME-INR
INR: 0.96
Prothrombin Time: 12.8 seconds (ref 11.4–15.2)

## 2016-04-10 LAB — APTT: aPTT: 32 seconds (ref 24–36)

## 2016-04-10 SURGERY — LEFT HEART CATH AND CORONARY ANGIOGRAPHY
Anesthesia: Moderate Sedation

## 2016-04-10 MED ORDER — ATORVASTATIN CALCIUM 20 MG PO TABS
80.0000 mg | ORAL_TABLET | Freq: Every day | ORAL | Status: DC
  Administered 2016-04-10 – 2016-04-11 (×2): 80 mg via ORAL
  Filled 2016-04-10 (×2): qty 4
  Filled 2016-04-10: qty 1

## 2016-04-10 MED ORDER — VERAPAMIL HCL 2.5 MG/ML IV SOLN
INTRAVENOUS | Status: AC
  Filled 2016-04-10: qty 2

## 2016-04-10 MED ORDER — SODIUM CHLORIDE 0.9% FLUSH
3.0000 mL | Freq: Two times a day (BID) | INTRAVENOUS | Status: DC
  Administered 2016-04-10 – 2016-04-12 (×4): 3 mL via INTRAVENOUS

## 2016-04-10 MED ORDER — NITROGLYCERIN 1 MG/10 ML FOR IR/CATH LAB
INTRA_ARTERIAL | Status: DC | PRN
  Administered 2016-04-10 (×2): 200 ug via INTRACORONARY

## 2016-04-10 MED ORDER — NITROGLYCERIN 2 % TD OINT
TOPICAL_OINTMENT | TRANSDERMAL | Status: AC
  Filled 2016-04-10: qty 1

## 2016-04-10 MED ORDER — HEPARIN (PORCINE) IN NACL 100-0.45 UNIT/ML-% IJ SOLN
750.0000 [IU]/h | INTRAMUSCULAR | Status: DC
  Filled 2016-04-10: qty 250

## 2016-04-10 MED ORDER — ONDANSETRON HCL 4 MG/2ML IJ SOLN: 4.0000 mg | Freq: Four times a day (QID) | INTRAMUSCULAR | Status: DC | PRN

## 2016-04-10 MED ORDER — HEPARIN BOLUS VIA INFUSION
3500.0000 [IU] | Freq: Once | INTRAVENOUS | Status: DC
  Filled 2016-04-10: qty 3500

## 2016-04-10 MED ORDER — MIDAZOLAM HCL 2 MG/2ML IJ SOLN
INTRAMUSCULAR | Status: DC | PRN
  Administered 2016-04-10: 1 mg via INTRAVENOUS

## 2016-04-10 MED ORDER — SODIUM CHLORIDE 0.9% FLUSH: 3.0000 mL | INTRAVENOUS | Status: DC | PRN

## 2016-04-10 MED ORDER — ENOXAPARIN SODIUM 40 MG/0.4ML ~~LOC~~ SOLN
40.0000 mg | SUBCUTANEOUS | Status: DC
  Filled 2016-04-10 (×2): qty 0.4

## 2016-04-10 MED ORDER — IOPAMIDOL (ISOVUE-370) INJECTION 76%
100.0000 mL | Freq: Once | INTRAVENOUS | Status: AC | PRN
  Administered 2016-04-10: 100 mL via INTRAVENOUS

## 2016-04-10 MED ORDER — ACETAMINOPHEN 325 MG PO TABS: 650.0000 mg | ORAL_TABLET | ORAL | Status: DC | PRN

## 2016-04-10 MED ORDER — NITROGLYCERIN 2 % TD OINT
1.0000 [in_us] | TOPICAL_OINTMENT | Freq: Once | TRANSDERMAL | Status: AC
  Administered 2016-04-10: 1 [in_us] via TOPICAL

## 2016-04-10 MED ORDER — TICAGRELOR 90 MG PO TABS
ORAL_TABLET | ORAL | Status: DC | PRN
  Administered 2016-04-10: 180 mg via ORAL

## 2016-04-10 MED ORDER — SODIUM CHLORIDE 0.9 % IV SOLN: 250.0000 mL | INTRAVENOUS | Status: DC | PRN

## 2016-04-10 MED ORDER — HEPARIN SODIUM (PORCINE) 1000 UNIT/ML IJ SOLN
INTRAMUSCULAR | Status: DC | PRN
  Administered 2016-04-10: 5000 [IU] via INTRAVENOUS
  Administered 2016-04-10 (×2): 2000 [IU] via INTRAVENOUS

## 2016-04-10 MED ORDER — CARVEDILOL 3.125 MG PO TABS
3.1250 mg | ORAL_TABLET | Freq: Two times a day (BID) | ORAL | Status: DC
  Administered 2016-04-10 – 2016-04-11 (×3): 3.125 mg via ORAL
  Filled 2016-04-10 (×3): qty 1

## 2016-04-10 MED ORDER — TIROFIBAN HCL IN NACL 5-0.9 MG/100ML-% IV SOLN
INTRAVENOUS | Status: DC | PRN
  Administered 2016-04-10: 0.15 ug/kg/min via INTRAVENOUS

## 2016-04-10 MED ORDER — TIROFIBAN HCL IN NACL 5-0.9 MG/100ML-% IV SOLN
0.1500 ug/kg/min | INTRAVENOUS | Status: AC
  Administered 2016-04-10 (×2): 0.15 ug/kg/min via INTRAVENOUS
  Filled 2016-04-10: qty 100

## 2016-04-10 MED ORDER — HYDROMORPHONE HCL 1 MG/ML IJ SOLN
INTRAMUSCULAR | Status: AC
  Filled 2016-04-10: qty 1

## 2016-04-10 MED ORDER — LIDOCAINE HCL (PF) 1 % IJ SOLN
INTRAMUSCULAR | Status: AC
  Filled 2016-04-10: qty 30

## 2016-04-10 MED ORDER — ASPIRIN 81 MG PO CHEW
81.0000 mg | CHEWABLE_TABLET | Freq: Every day | ORAL | Status: DC
  Administered 2016-04-11 – 2016-04-12 (×2): 81 mg via ORAL
  Filled 2016-04-10 (×2): qty 1

## 2016-04-10 MED ORDER — MIDAZOLAM HCL 2 MG/2ML IJ SOLN
INTRAMUSCULAR | Status: AC
  Filled 2016-04-10: qty 2

## 2016-04-10 MED ORDER — VERAPAMIL HCL 2.5 MG/ML IV SOLN
INTRAVENOUS | Status: DC | PRN
  Administered 2016-04-10: 2.5 mg via INTRA_ARTERIAL

## 2016-04-10 MED ORDER — FENTANYL CITRATE (PF) 100 MCG/2ML IJ SOLN
INTRAMUSCULAR | Status: DC | PRN
  Administered 2016-04-10: 25 ug via INTRAVENOUS

## 2016-04-10 MED ORDER — TICAGRELOR 90 MG PO TABS
ORAL_TABLET | ORAL | Status: AC
  Filled 2016-04-10: qty 2

## 2016-04-10 MED ORDER — NITROGLYCERIN 5 MG/ML IV SOLN
INTRAVENOUS | Status: AC
  Filled 2016-04-10: qty 10

## 2016-04-10 MED ORDER — TICAGRELOR 90 MG PO TABS
90.0000 mg | ORAL_TABLET | Freq: Two times a day (BID) | ORAL | Status: DC
  Administered 2016-04-11 – 2016-04-12 (×3): 90 mg via ORAL
  Filled 2016-04-10 (×3): qty 1

## 2016-04-10 MED ORDER — PNEUMOCOCCAL VAC POLYVALENT 25 MCG/0.5ML IJ INJ
0.5000 mL | INJECTION | INTRAMUSCULAR | Status: DC
  Filled 2016-04-10: qty 0.5

## 2016-04-10 MED ORDER — TIROFIBAN HCL IN NACL 5-0.9 MG/100ML-% IV SOLN
INTRAVENOUS | Status: AC
  Filled 2016-04-10: qty 100

## 2016-04-10 MED ORDER — LABETALOL HCL 5 MG/ML IV SOLN: 10.0000 mg | INTRAVENOUS | Status: AC | PRN

## 2016-04-10 MED ORDER — NITROGLYCERIN 0.4 MG SL SUBL: 0.4000 mg | SUBLINGUAL_TABLET | Freq: Once | SUBLINGUAL | Status: DC

## 2016-04-10 MED ORDER — TIROFIBAN (AGGRASTAT) BOLUS VIA INFUSION
INTRAVENOUS | Status: DC | PRN
  Administered 2016-04-10: 1475 ug via INTRAVENOUS

## 2016-04-10 MED ORDER — HEPARIN SODIUM (PORCINE) 1000 UNIT/ML IJ SOLN
INTRAMUSCULAR | Status: AC
  Filled 2016-04-10: qty 1

## 2016-04-10 MED ORDER — HYDRALAZINE HCL 20 MG/ML IJ SOLN: 5.0000 mg | INTRAMUSCULAR | Status: AC | PRN

## 2016-04-10 MED ORDER — HEPARIN SODIUM (PORCINE) 5000 UNIT/ML IJ SOLN: 60.0000 [IU]/kg | Freq: Once | INTRAMUSCULAR | Status: DC

## 2016-04-10 MED ORDER — SODIUM CHLORIDE 0.9 % IV SOLN
INTRAVENOUS | Status: AC
  Administered 2016-04-10: 17:00:00 via INTRAVENOUS

## 2016-04-10 MED ORDER — FENTANYL CITRATE (PF) 100 MCG/2ML IJ SOLN
INTRAMUSCULAR | Status: AC
  Filled 2016-04-10: qty 2

## 2016-04-10 MED ORDER — IOPAMIDOL (ISOVUE-300) INJECTION 61%
INTRAVENOUS | Status: DC | PRN
  Administered 2016-04-10: 115 mL via INTRA_ARTERIAL

## 2016-04-10 MED ORDER — HYDROMORPHONE HCL 1 MG/ML IJ SOLN
0.5000 mg | Freq: Once | INTRAMUSCULAR | Status: AC
  Administered 2016-04-10: 0.5 mg via INTRAVENOUS

## 2016-04-10 SURGICAL SUPPLY — 16 items
BALLN TREK RX 2.5X12 (BALLOONS) ×3
BALLN ~~LOC~~ EUPHORA RX 3.5X20 (BALLOONS) ×3
BALLOON TREK RX 2.5X12 (BALLOONS) ×1 IMPLANT
BALLOON ~~LOC~~ EUPHORA RX 3.5X20 (BALLOONS) ×1 IMPLANT
CATH 5F 110X4 TIG (CATHETERS) ×3 IMPLANT
CATH EAGLE EYE PLAT IMAGING (CATHETERS) ×3 IMPLANT
CATH PRIORITY ONE AC 6F (CATHETERS) ×3 IMPLANT
CATH VISTA GUIDE 6FR JR4 (CATHETERS) ×3 IMPLANT
DEVICE INFLAT 30 PLUS (MISCELLANEOUS) ×3 IMPLANT
DEVICE RAD TR BAND REGULAR (VASCULAR PRODUCTS) ×3 IMPLANT
GLIDESHEATH SLEND SS 6F .021 (SHEATH) ×3 IMPLANT
KIT MANI 3VAL PERCEP (MISCELLANEOUS) ×3 IMPLANT
PACK CARDIAC CATH (CUSTOM PROCEDURE TRAY) ×3 IMPLANT
STENT RESOLUTE INTEG 3.0X34 (Permanent Stent) ×3 IMPLANT
WIRE ROSEN-J .035X260CM (WIRE) ×3 IMPLANT
WIRE RUNTHROUGH .014X180CM (WIRE) ×3 IMPLANT
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
## 2016-04-10 NOTE — Progress Notes (Signed)
Informed Dr Marisue Humble of numbness to patient's right thumb, forefinger and middle finger, also pt has weak rt radial and pedal pulse,  O2 sats in right thumb are mid to high 90s. He acknowledged. No interventions at this time  Pt reporting returning feeling to thumb and fingers  at this time, 6 ccs air removed from TR band
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
## 2016-04-10 NOTE — Progress Notes (Signed)
eLink Physician-Brief Progress Note Patient Name: Clinton Walsh DOB: 09-13-1960 MRN: [id number]   Date of Service  04/10/2016  HPI/Events of Note  New pt evaluation stemi Stent RCA   eICU Interventions  Camera care: no distress, being interviewed by MD at bedside now  HR and BP wnl No distress Plan per cards tele     Intervention Category Evaluation Type: New Patient Evaluation  Raylene Miyamoto. 04/10/2016, 5:23 PM

## 2016-04-10 NOTE — Progress Notes (Signed)
ANTICOAGULATION CONSULT NOTE - Initial Consult  Pharmacy Consult for Heparin Drip  Indication: chest pain/ACS  Allergies  Allergen Reactions  . Sulfa Antibiotics Other (See Comments)    unknown    Patient Measurements: Height: '5\' 5"'$  (165.1 cm) Weight: 130 lb (59 kg) IBW/kg (Calculated) : 61.5  Vital Signs: Temp: 97.5 F (36.4 C) (04/10 1417) Temp Source: Oral (04/10 1417) BP: 135/92 (04/10 1436) Pulse Rate: 74 (04/10 1436)  Labs:  Recent Labs  04/10/16 1415  HGB 15.3  HCT 44.3  PLT 260    CrCl cannot be calculated (Patient's most recent lab result is older than the maximum 21 days allowed.).   Medical History: Past Medical History:  Diagnosis Date  . CAD (coronary artery disease)    a.  2008 Cath:  nonobstructive plaque, NL LV;  b. 03/2011 NSTEMI/Cath:  100% D1, otw nonobs dzs, EF 45-50%.  D1 stented w/  2.25x18 MiniVision BMS. Presented on 04/14/11 with NSTEMI. Cath showed stent thrmobosis likely due to not taking DAPT.  Treated with thrombectomy and angioplasty.   . Hyperlipidemia   . Myocardial infarct   . Pulmonary nodules    nonspecific nodules in the right middle lobe  and right lower lobe by CT '08  . Tobacco abuse     Assessment: 56 yo male with chest pain. Pharmacy consulted for heparin drip dosing and monitoring.   Goal of Therapy:  Heparin level 0.3-0.7 units/ml Monitor platelets by anticoagulation protocol: Yes   Plan:  Baseline labs orders.  Give 3500 units bolus x 1 Start heparin infusion at 750 units/hr Check anti-Xa level in 6 hours and daily while on heparin Continue to monitor H&H and platelets  Pernell Dupre, PharmD, BCPS Clinical Pharmacist 04/10/2016 2:45 PM

## 2016-04-10 NOTE — ED Triage Notes (Signed)
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

## 2016-04-10 NOTE — ED Notes (Signed)
Patient's cell phone given to patient's wife and wife was escorted to specials waiting room.
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
## 2016-04-10 NOTE — ED Notes (Signed)
Patient taken to Cath lab

## 2016-04-10 NOTE — ED Notes (Signed)
Called [facility] to carelink [phone]

## 2016-04-11 ENCOUNTER — Encounter: Payer: Self-pay | Admitting: Internal Medicine

## 2016-04-11 DIAGNOSIS — R079 Chest pain, unspecified: Secondary | ICD-10-CM

## 2016-04-11 DIAGNOSIS — I200 Unstable angina: Secondary | ICD-10-CM

## 2016-04-11 LAB — LIPID PANEL
Cholesterol: 183 mg/dL (ref 0–200)
HDL: 34 mg/dL — AB (ref >40)
LDL CALC: 131 mg/dL — AB (ref 0–99)
Total CHOL/HDL Ratio: 5.4 RATIO
Triglycerides: 90 mg/dL (ref <150)
VLDL: 18 mg/dL (ref 0–40)

## 2016-04-11 LAB — GLUCOSE, CAPILLARY: GLUCOSE-CAPILLARY: 66 mg/dL (ref 65–99)

## 2016-04-11 LAB — BASIC METABOLIC PANEL
Anion gap: 5 (ref 5–15)
BUN: 18 mg/dL (ref 6–20)
CHLORIDE: 109 mmol/L (ref 101–111)
CO2: 24 mmol/L (ref 22–32)
Calcium: 8.4 mg/dL — ABNORMAL LOW (ref 8.9–10.3)
Creatinine, Ser: 0.93 mg/dL (ref 0.61–1.24)
GFR calc Af Amer: >60 mL/min (ref >60)
GFR calc non Af Amer: >60 mL/min (ref >60)
GLUCOSE: 88 mg/dL (ref 65–99)
POTASSIUM: 3.8 mmol/L (ref 3.5–5.1)
Sodium: 138 mmol/L (ref 135–145)

## 2016-04-11 LAB — CBC
HEMATOCRIT: 40.2 % (ref 40.0–52.0)
Hemoglobin: 13.7 g/dL (ref 13.0–18.0)
MCH: 30.6 pg (ref 26.0–34.0)
MCHC: 34.1 g/dL (ref 32.0–36.0)
MCV: 89.7 fL (ref 80.0–100.0)
Platelets: 224 10*3/uL (ref 150–440)
RBC: 4.48 MIL/uL (ref 4.40–5.90)
RDW: 13.9 % (ref 11.5–14.5)
WBC: 4.6 10*3/uL (ref 3.8–10.6)

## 2016-04-11 LAB — ECHOCARDIOGRAM COMPLETE
HEIGHTINCHES: 65 in
Weight: 1957.68 oz

## 2016-04-11 LAB — TSH: TSH: 4.307 u[IU]/mL (ref 0.350–4.500)

## 2016-04-11 LAB — POCT ACTIVATED CLOTTING TIME
ACTIVATED CLOTTING TIME: 241 s
ACTIVATED CLOTTING TIME: 318 s

## 2016-04-11 LAB — TROPONIN I: Troponin I: 3.56 ng/mL (ref <0.03)

## 2016-04-11 MED ORDER — LISINOPRIL 5 MG PO TABS
5.0000 mg | ORAL_TABLET | Freq: Every day | ORAL | Status: DC
  Administered 2016-04-11 – 2016-04-12 (×2): 5 mg via ORAL
  Filled 2016-04-11 (×2): qty 1

## 2016-04-11 NOTE — Progress Notes (Signed)
Report called in to [facility] on 2A. Patient to transfer to room 253 on tele. RN present on patient arrival. Telemetry box placed on patient. CCMD and Elink notified of transfer. Patient and family updated on plan of care. No further questions.

## 2016-04-11 NOTE — Progress Notes (Signed)
Attending Note Patient seen and examined, agree with detailed note above,  Patient presentation and plan discussed on rounds.   Please see note by Christell Faith For complete details. No symptoms this morning, ambulating well without any problem Cardiac catheterization details discussed with him  On physical exam lungs are clear, no JVP, heart sounds regular with no murmurs appreciated, abdomen soft nontender no significant lower extremity edema  Lab work reviewed showing peak troponin 4.35 now trending downwards 3.56  Echocardiogram results reviewed with him showing severely depressed ejection fraction 30%, diffuse hypokinesis with inferior and inferolateral hypokinesis  ---STEMI Stent placed to the proximal RCA Recovering well Moderate to severely depressed ejection fraction, appears out of proportion to some degree to his coronary disease.  May have component of nonischemic and ischemic DAPT With aspirin as detailed, brilinta 90 BID asa 81 daily Would continue statin, beta blocker, ACE inhibitor He will need close outpatient follow-up for his depressed ejection fraction   Greater than 50% was spent in counseling and coordination of care with patient Total encounter time 25 minutes or more   Signed: Esmond Plants  M.D., Ph.D. [facility]

## 2016-04-11 NOTE — Consult Note (Signed)
PULMONARY / CRITICAL CARE MEDICINE   Name: Clinton Walsh MRN: [id number] DOB: 1960/11/28    ADMISSION DATE:  04/10/2016   CONSULTATION DATE:  04/10/16  REFERRING MD: Dr. Verdell Carmine  CHIEF COMPLAINT:  Chest pain  HISTORY OF PRESENT ILLNESS:   56 y/o WM with a PMH of CAD s/p MI X2, hyperlipidemia and tobacco use who presented to the ED with sudden onset chest pain. EKG show ST elevation in inferior leads. He was taken to the cath lab emergently for a LHC with PCI to the mid/proximal RCA. He is now chest pain free. Denies any dyspnea, palpitations, nausea/vomiting.  PAST MEDICAL HISTORY :  He  has a past medical history of CAD (coronary artery disease); Hyperlipidemia; Myocardial infarct; Pulmonary nodules; and Tobacco abuse.  PAST SURGICAL HISTORY: He  has a past surgical history that includes Back surgery; Appendectomy; Cardiac catheterization; left heart catheterization with coronary angiogram (N/A, 03/16/2011); Left Heart Cath and Coronary Angiography (N/A, 04/10/2016); Coronary Stent Intervention (N/A, 04/10/2016); and Intravascular Ultrasound/IVUS (N/A, 04/10/2016).  Allergies  Allergen Reactions  . Sulfa Antibiotics Other (See Comments)    unknown    No current facility-administered medications on file prior to encounter.    Current Outpatient Prescriptions on File Prior to Encounter  Medication Sig  . famotidine (PEPCID) 20 MG tablet One at bedtime (Patient not taking: Reported on 04/10/2016)  . HYDROcodone-acetaminophen (NORCO) 5-325 MG tablet Take 1 tablet by mouth every 6 (six) hours as needed for moderate pain. (Patient not taking: Reported on 09/26/2015)  . ketorolac (ACULAR) 0.4 % SOLN Place 1 drop into the left eye 4 (four) times daily. (Patient not taking: Reported on 09/26/2015)  . nicotine (RA NICOTINE) 14 mg/24hr patch Place 1 patch onto the skin daily.    FAMILY HISTORY:  His indicated that his mother is alive. He indicated that his father is alive. He indicated  that the status of his maternal grandfather is unknown.    SOCIAL HISTORY: He  reports that he has been smoking E-cigarettes.  He has a 7.50 pack-year smoking history. He has never used smokeless tobacco. He reports that he drinks alcohol. He reports that he does not use drugs.  REVIEW OF SYSTEMS:   Constitutional: Negative for fever and chills.  HENT: Negative for congestion and rhinorrhea.  Eyes: Negative for redness and visual disturbance.  Respiratory: Negative for shortness of breath and wheezing.  Cardiovascular: Negative for chest pain and palpitations.  Gastrointestinal: Negative  for nausea , vomiting and abdominal pain and  Loose stools Genitourinary: Negative for dysuria and urgency.  Endocrine: Denies polyuria, polyphagia and heat intolerance Musculoskeletal: c/o pain in the right arm which he attributes to the angiocath and PCI  Access site at his right wrist Skin: Negative for pallor and wound.  Neurological: Negative for dizziness and headaches   SUBJECTIVE:    VITAL SIGNS: BP 116/77   Pulse (!) 55   Temp 97.4 F (36.3 C) (Oral)   Resp 18   Ht '5\' 5"'$  (1.651 m)   Wt 122 lb 5.7 oz (55.5 kg)   SpO2 94%   BMI 20.36 kg/m   HEMODYNAMICS:    VENTILATOR SETTINGS:    INTAKE / OUTPUT: I/O last 3 completed shifts: In: 549.1 [P.O.:350; I.V.:199.1] Out: 1650 [Urine:1650]  PHYSICAL EXAMINATION: General:  NAD Neuro:  A & O X 4 HEENT:  PERRLA, oral mucosa pink Cardiovascular:  RRR, S1/S2, no MRG Lungs: CTAB Abdomen: Soft, NT/ND, +BS X4 Musculoskeletal:  +ROM Skin:  Warm and dry,  right wrist with intact dressing, right antecubital IV site with mild bleeding  LABS:  BMET  Recent Labs Lab 04/10/16 1415 04/11/16 0454  NA 137 138  K 3.8 3.8  CL 106 109  CO2 22 24  BUN 20 18  CREATININE 0.92 0.93  GLUCOSE 95 88    Electrolytes  Recent Labs Lab 04/10/16 1415 04/11/16 0454  CALCIUM 9.3 8.4*    CBC  Recent Labs Lab 04/10/16 1415  04/10/16 2240 04/11/16 0454  WBC 7.1  --  4.6  HGB 15.3  --  13.7  HCT 44.3  --  40.2  PLT 260 229 224    Coag's  Recent Labs Lab 04/10/16 1424  APTT 32  INR 0.96    Sepsis Markers No results for input(s): LATICACIDVEN, PROCALCITON, O2SATVEN in the last 168 hours.  ABG No results for input(s): PHART, PCO2ART, PO2ART in the last 168 hours.  Liver Enzymes  Recent Labs Lab 04/10/16 1415  AST 24  ALT 14*  ALKPHOS 63  BILITOT 0.4  ALBUMIN 4.2    Cardiac Enzymes  Recent Labs Lab 04/10/16 1741 04/10/16 2240 04/11/16 0454  TROPONINI 1.14* 4.35* 3.56*    Glucose No results for input(s): GLUCAP in the last 168 hours.  Imaging Dg Chest Portable 1 View  Result Date: 04/10/2016 CLINICAL DATA:  Right-sided chest pain. EXAM: PORTABLE CHEST 1 VIEW COMPARISON:  September 18, 2011 FINDINGS: A transcutaneous pacer lead overlies the left side of the chest. There is a new opacity over the lateral left upper lobe, partially obscured by the transcutaneous pacer. No other nodules or masses are identified. No pneumothorax. The heart size in is borderline. The hila and mediastinum are unremarkable. No other acute abnormalities. IMPRESSION: 1. New opacity in the left upper lung could represent acute infiltrate or scarring new since September 2013. Underlying neoplasm not completely excluded on this single study. In the setting of acute symptoms, a short-term follow-up chest x-ray in 2 or 3 weeks could be utilized to further assess. In the absence of acute symptoms, recommend a chest CT for better evaluation. Electronically Signed   By: Dorise Bullion III M.D   On: 04/10/2016 14:35   Ct Angio Chest/abd/pel For Dissection W And/or W/wo  Result Date: 04/10/2016 CLINICAL DATA:  Chest pain and abdominal pain radiating toward back EXAM: CT ANGIOGRAPHY CHEST, ABDOMEN AND PELVIS TECHNIQUE: Initially, axial CT images were obtained through the chest without intravenous contrast material  administration. Multidetector CT imaging through the chest, abdomen and pelvis was performed using the standard protocol during bolus administration of intravenous contrast. Multiplanar reconstructed images and MIPs were obtained and reviewed to evaluate the vascular anatomy. CONTRAST:  100 mL Isovue 370 nonionic COMPARISON:  Chest CT October 11, 2006; chest radiograph April 10, 2016 ; chest radiograph September 18, 2011 FINDINGS: CTA CHEST FINDINGS Cardiovascular: There is no intramural hematoma in the thoracic spine on the noncontrast enhanced images. The ascending thoracic aorta measures 4.0 x 3.9 cm. There is no thoracic aortic dissection. The visualized great vessels show no aneurysm or dissection. There is no appreciable obstruction in these vessels. There are foci of coronary artery calcification. The pericardium is not appreciably thickened. There is no demonstrable pulmonary embolus. Mediastinum/Nodes: Visualized thyroid appears normal. There is a lymph node in the pretracheal region measuring 1.6 x 1.2 cm. There are lymph nodes adjacent to the descending thoracic aorta, largest measuring 1.3 x 1.0 cm. There is a the lymph node in the right hilum measuring 1.0 x 0.9  cm. Several slightly smaller lymph nodes are noted as well. Lungs/Pleura: There is underlying bullous emphysematous change. There are is a somewhat irregular nodular opacity with associated cicatrization in the anterior segment of the right upper lobe measuring 2.1 x 0.8 cm. There is a a nodular opacity in the lateral segment of the right lower lobe measuring 4 mm, best seen on axial slice 51 series 4. There is a 2 mm nodular opacity abutting the pleura in the superior segment of the right lower lobe on axial slice 48 series 4. There is bibasilar lung scarring. There is no lung edema or consolidation. There is no pleural effusion or pleural thickening. Musculoskeletal: There are no blastic or lytic bone lesions. Review of the MIP images confirms  the above findings. CTA ABDOMEN AND PELVIS FINDINGS VASCULAR Aorta: There is no demonstrable thoracic aortic dissection. There is atherosclerotic calcification in the aorta. There is dilatation of the abdominal aorta focally slightly inferior to the renal arteries with a maximum transverse diameter of 3.0 x 2.6 cm. There is patchy atherosclerotic calcification in the aorta without high-grade obstruction. Celiac: No aneurysm or dissection. The celiac artery and its major branches are patent. SMA: No aneurysm or dissection. The superior mesenteric artery its major branches are widely patent. Renals: There is a single renal artery on the right. There are 2 adjacent renal arteries on the left. No aneurysm or dissection. No fibromuscular dysplasia evident. These vessels are widely patent bilaterally. IMA: Inferior mesenteric artery is patent without aneurysm or dissection. Inflow: The pelvic arterial vessels show no aneurysm or dissection. The common iliac arteries are tortuous and contain calcification without hemodynamically significant obstruction. Scattered foci of calcification are noted in the right hypogastric artery. No hemodynamically significant obstruction is seen in the major pelvic arterial vessels. Veins: No obvious venous abnormality within the limitations of this arterial phase study. Review of the MIP images confirms the above findings. NON-VASCULAR Hepatobiliary: No focal liver lesions are evident on this study which was performed in arterial phase. Gallbladder wall is not appreciably thickened. There is no biliary duct dilatation. Pancreas: There is no pancreatic mass or inflammatory focus. Spleen: No splenic lesions are evident. Spleen is normal in size and contour. Adrenals/Urinary Tract: Adrenals appear normal bilaterally. Kidneys bilaterally show no mass or hydronephrosis on either side. There is no renal or ureteral calculus on either side. Urinary bladder is distended with wall thickness within  normal limits. Stomach/Bowel: There is no appreciable bowel wall or mesenteric thickening. There is moderate stool throughout the colon. There is no bowel obstruction. No free air or portal venous air. Lymphatic: There is evidence of adenopathy in the periaortic regions to the left and to the right of the aorta at the level of the renal arteries. The largest lymph node to the left of the aorta measures 2.4 x 1.8 cm. Adenopathy noted to the right of the aorta at this level measures 2.4 x 1.5 cm. No other adenopathy is appreciable on this study. Reproductive: Prostate and seminal vesicles appear unremarkable in size and contour. No pelvic masses appreciable. Other: Appendix absent. No abscess or ascites evident in the abdomen or pelvis. Musculoskeletal: There is age uncertain anterior wedging of the L1 vertebral body. No other fracture evident. No blastic or lytic bone lesions. There is degenerative change in the lumbar spine. There is no intramuscular or abdominal wall lesion. Review of the MIP images confirms the above findings. IMPRESSION: CT angiogram chest: No thoracic aortic dissection. Ascending thoracic aorta measures 4.0 x  3.9 cm in diameter. Recommend annual imaging followup by CTA or MRA. This recommendation follows 2010 ACCF/AHA/AATS/ACR/ASA/SCA/SCAI/SIR/STS/SVM Guidelines for the Diagnosis and Management of Patients with Thoracic Aortic Disease. Circulation. 2010; 121: I458-K998. No evident pulmonary embolus. No pericardial thickening. Irregular opacity in the left upper lobe anterior segment measuring 2.1 x 0.8 cm, not present on studies prior to today. Smaller nodular lesions also noted in the right lower lobe. This irregular opacity potentially could represent a scar type carcinoma. Correlation with nuclear medicine PET study advised to assess for potential abnormal metabolic activity. There is underlying centrilobular emphysematous change with multiple bullae. Several mildly enlarged lymph nodes. The  possibility of neoplastic involvement given the lesion in the left upper lobe must be of concern. Again, nuclear medicine PET study would be helpful to assess degree of metabolic activity in these areas. CT angiogram abdomen and pelvis: Atherosclerotic calcification in the aorta and major pelvic arteries. No hemodynamically significant obstructing lesions apparent. There is dilatation in the mid thoracic aorta maximum transverse diameter of 3.0 x 2.6 cm. Recommend followup by ultrasound in 3 years. This recommendation follows ACR consensus guidelines: White Paper of the ACR Incidental Findings Committee II on Vascular Findings. J Am Coll Radiol 2013; 10:789-794. No demonstrable dissection in the aorta or major abdominal/pelvic arterial vessels. Evidence of retroperitoneal adenopathy in the mid aortic region. Neoplastic etiology must be of concern, particularly given the findings in the chest. Correlation with nuclear medicine PET study again advised to assess for abnormal metabolic activity in these lymph nodes as well. Distended urinary bladder without wall thickening. No hydronephrosis. No renal or ureteral calculus on either side. No bowel obstruction.  No abscess.  Appendix absent. Age uncertain anterior wedging of the L1 vertebral body. No retropulsion of bone. Electronically Signed   By: Lowella Grip III M.D.   On: 04/10/2016 15:30    STUDIES:  2 D echo  CULTURES: None  ANTIBIOTICS: None  SIGNIFICANT EVENTS: 4/11>ED with chest pain>diagnosed with STEMI>Cath lab for PCI   LINES/TUBES: PIVs  DISCUSSION: 56 y/o male presenting with an acute STEMI likely due to plaque rupture. He is now S/P PCI to the proximal and mid RCA  ASSESSMENT  Acute STEMI s/p PCI to the RCA CAD s/p MI Tobacco use Pulmonary nodules Polymyalgia Hyperlipidemia  PLAN Continue carvedilol, ASA, atorvastatin and ticagrelor per cardiology Echo per cardiology Cycle troponin Smoking cessation reviewed at length  with patient  FAMILY  - Updates: No family at bedside. Patient updated on current treatment plan  - Inter-disciplinary family meet or Palliative Care meeting due by:  day 7  Plan of care discussed with [facility] MD  Santia Labate S. [facility] ANP-BC Pulmonary and Critical Care Medicine [facility] Pager [phone] or [phone]

## 2016-04-11 NOTE — Care Management (Signed)
Spoke with patient's wife.  He does have a payor and has pharmacy coverage. Anticipate discharge on Brilinta.  Will provide coupon.  Patient will transfer to 2A later today.  Prior to this episode of illness, Independent in all adls, denies issues accessing medical care, obtaining medications or with transportation.  Current with PCP Dr Halina Maidens [facility] in [place] .

## 2016-04-11 NOTE — Progress Notes (Signed)
Patient Name: Clinton Walsh Date of Encounter: 04/11/2016  Primary Cardiologist: prior patient of Fletcher Anon several years ago, lost to follow up (consult by End)  Hospital Problem List     Principal Problem:   STEMI (ST elevation myocardial infarction) ([place]) Active Problems:   STEMI involving right coronary artery ([place])     Subjective   Presented with inferior ST elevation MI with LHC showing non-occlusive thrombus in the proximal/mid RCA, likely representing acute plaque rupture and spontaneous recanalization of thrombotic occlusion. This was successfully treated with a single drug eluting stent. TTE pending. No further chest pain. No SOB. Renal function and CBC stable. K+ 3.8. Tolerating DAPT with ASA 81 mg daily and Brilinta 90 mg bid. No further right thumb numbness.   Inpatient Medications    Scheduled Meds: . aspirin  81 mg Oral Daily  . atorvastatin  80 mg Oral q1800  . carvedilol  3.125 mg Oral BID WC  . enoxaparin (LOVENOX) injection  40 mg Subcutaneous Q24H  . pneumococcal 23 valent vaccine  0.5 mL Intramuscular Tomorrow-1000  . sodium chloride flush  3 mL Intravenous Q12H  . ticagrelor  90 mg Oral BID   Continuous Infusions:  PRN Meds: sodium chloride, acetaminophen, ondansetron (ZOFRAN) IV, sodium chloride flush   Vital Signs    Vitals:   04/11/16 0000 04/11/16 0100 04/11/16 0200 04/11/16 0300  BP: 118/79 119/76 126/77 116/77  Pulse: (!) 55 (!) 55 (!) 52 (!) 55  Resp: '14 17 12 18  '$ Temp:      TempSrc:      SpO2: 96% 96% 93% 94%  Weight:      Height:        Intake/Output Summary (Last 24 hours) at 04/11/16 0720 Last data filed at 04/11/16 9150  Gross per 24 hour  Intake           549.11 ml  Output             1650 ml  Net         -1100.89 ml   Filed Weights   04/10/16 1418 04/10/16 1707  Weight: 130 lb (59 kg) 122 lb 5.7 oz (55.5 kg)    Physical Exam    GEN: Well nourished, well developed, in no acute distress.  HEENT: Grossly normal.    Neck: Supple, no JVD, carotid bruits, or masses. Cardiac: RRR, no murmurs, rubs, or gallops. No clubbing, cyanosis, edema.  Radials/DP/PT 2+ and equal bilaterally. Right radial cath site with 2+ pulse. No further numbness of right thumb. Respiratory:  Respirations regular and unlabored, clear to auscultation bilaterally. GI: Soft, nontender, nondistended, BS + x 4. MS: no deformity or atrophy. Skin: warm and dry, no rash. Neuro:  Strength and sensation are intact. Psych: AAOx3.  Normal affect.  Labs    CBC  Recent Labs  04/10/16 1415 04/10/16 2240 04/11/16 0454  WBC 7.1  --  4.6  HGB 15.3  --  13.7  HCT 44.3  --  40.2  MCV 87.7  --  89.7  PLT 260 229 569   Basic Metabolic Panel  Recent Labs  04/10/16 1415 04/11/16 0454  NA 137 138  K 3.8 3.8  CL 106 109  CO2 22 24  GLUCOSE 95 88  BUN 20 18  CREATININE 0.92 0.93  CALCIUM 9.3 8.4*   Liver Function Tests  Recent Labs  04/10/16 1415  AST 24  ALT 14*  ALKPHOS 63  BILITOT 0.4  PROT 8.2*  ALBUMIN  4.2   No results for input(s): LIPASE, AMYLASE in the last 72 hours. Cardiac Enzymes  Recent Labs  04/10/16 1741 04/10/16 2240 04/11/16 0454  TROPONINI 1.14* 4.35* 3.56*   BNP Invalid input(s): POCBNP D-Dimer No results for input(s): DDIMER in the last 72 hours. Hemoglobin A1C No results for input(s): HGBA1C in the last 72 hours. Fasting Lipid Panel  Recent Labs  04/11/16 0454  CHOL 183  HDL 34*  LDLCALC 131*  TRIG 90  CHOLHDL 5.4   Thyroid Function Tests  Recent Labs  04/11/16 0454  TSH 4.307    Telemetry    Sinus bradycardia, 50s to upper 40s bpm, occasional PVC with rare ventricular couplet - Personally Reviewed  ECG    Sinus bradycardia, 55 bpm, prior inferior infarct, inferior TWI, nonspecific st/t changes lead V6 - Personally Reviewed  Radiology    Dg Chest Portable 1 View  Result Date: 04/10/2016 IMPRESSION: 1. New opacity in the left upper lung could represent acute  infiltrate or scarring new since September 2013. Underlying neoplasm not completely excluded on this single study. In the setting of acute symptoms, a short-term follow-up chest x-ray in 2 or 3 weeks could be utilized to further assess. In the absence of acute symptoms, recommend a chest CT for better evaluation. Electronically Signed   By: Dorise Bullion III M.D   On: 04/10/2016 14:35   Ct Angio Chest/abd/pel For Dissection W And/or W/wo  Result Date: 04/10/2016 IMPRESSION: CT angiogram chest: No thoracic aortic dissection. Ascending thoracic aorta measures 4.0 x 3.9 cm in diameter. Recommend annual imaging followup by CTA or MRA. This recommendation follows 2010 ACCF/AHA/AATS/ACR/ASA/SCA/SCAI/SIR/STS/SVM Guidelines for the Diagnosis and Management of Patients with Thoracic Aortic Disease. Circulation. 2010; 121: R678-L381. No evident pulmonary embolus. No pericardial thickening. Irregular opacity in the left upper lobe anterior segment measuring 2.1 x 0.8 cm, not present on studies prior to today. Smaller nodular lesions also noted in the right lower lobe. This irregular opacity potentially could represent a scar type carcinoma. Correlation with nuclear medicine PET study advised to assess for potential abnormal metabolic activity. There is underlying centrilobular emphysematous change with multiple bullae. Several mildly enlarged lymph nodes. The possibility of neoplastic involvement given the lesion in the left upper lobe must be of concern. Again, nuclear medicine PET study would be helpful to assess degree of metabolic activity in these areas. CT angiogram abdomen and pelvis: Atherosclerotic calcification in the aorta and major pelvic arteries. No hemodynamically significant obstructing lesions apparent. There is dilatation in the mid thoracic aorta maximum transverse diameter of 3.0 x 2.6 cm. Recommend followup by ultrasound in 3 years. This recommendation follows ACR consensus guidelines: White Paper of  the ACR Incidental Findings Committee II on Vascular Findings. J Am Coll Radiol 2013; 10:789-794. No demonstrable dissection in the aorta or major abdominal/pelvic arterial vessels. Evidence of retroperitoneal adenopathy in the mid aortic region. Neoplastic etiology must be of concern, particularly given the findings in the chest. Correlation with nuclear medicine PET study again advised to assess for abnormal metabolic activity in these lymph nodes as well. Distended urinary bladder without wall thickening. No hydronephrosis. No renal or ureteral calculus on either side. No bowel obstruction.  No abscess.  Appendix absent. Age uncertain anterior wedging of the L1 vertebral body. No retropulsion of bone. Electronically Signed   By: Lowella Grip III M.D.   On: 04/10/2016 15:30    Cardiac Studies   LHC 04/10/2016: Conclusion   Conclusions: 1. Inferior STEMI with acute plaque  rupture and thrombotic occlusion (spontaneous recanalization) of proximal/mid RCA. At time of catheterization, there was irregular stenosis (up to 90%) with intraluminal thrombus. 2. Mild to moderate, non-obstructive CAD involving the left coronary artery. 3. Patent bare metal stent in D1 with up to 20% in-stent restenosis. 4. Successful IVUS-guided PCI to proximal/mid RCA with placement of an Integrity Resolute 3.0 x 34 mm drug-eluting stent (post-dilated at high pressure with 3.5 mm [place] balloon) with 0% residual stenosis and TIMI-3 flow.   Recommendations: 1. Admit to ICU overnight for post-STEMI care. If stable, could be transferred to telemetry tomorrow afternoon. 2. Continue tirofiban for 8 hours, given significant thrombus burden at start of case. 3. Dual antiplatelet therapy with aspirin and ticagrelor for at least 12 months. Given history of stent thrombosis in 2013, I would be hesitant to use clopidogrel. 4. Aggressive secondary prevention, including high-intensity statin therapy. 5. Obtain transthoracic  echocardiogram before discharge.    TTE pending.   Patient Profile     56 y.o. male with history of CAD with occluded diagonal branch in setting of NSTEMI in 2013 that was treated with BMS by Dr. Lia Foyer. He returned to [facility] with similar chest pain about 1 month later and was found to have subacute stent thrombosis, which was successfully treated with aspiration thrombectomy and balloon angioplasty. He has been lost to cardiology follow-up and does not take any medications on a regular basis. Patient presented to [facility] on 04/10/2016 with chest pain and was found to have subtle inferior ST-segment elevation of up to 1 mm (though baseline artifact limits evaluation s/p LHC that showed non-occlusive thrombus in the proximal/mid RCA, likely representing acute plaque rupture and spontaneous recanalization of thrombotic occlusion. This was successfully treated with a single drug eluting stent.  Assessment & Plan    1. Inferior ST elevation MI: -STEMI due to acute plaque rupture with thrombotic occlusion of proximal/mid RCA (type 1 MI). By the time he reached the cath lab, the occlusion had spontaneously recanalized, though significant intraluminal thrombus remained. This was successfully treated with aspiration thrombectomy and IVUS-guided PCI -Troponin peaked at 4.35, now down trending -Chest pain free, no SOB -Continue DAPT with ASA 81 mg daily and Brilinta 90 mg bid, the importance of DAPT was stressed to the patient and will continue to discuss with him daily throughout this admission -Continue Coreg 3/125 mg bid )heart rate precludes titration at this time), he has been bradycardic, though asymptomatic, currently with heart rate in the mid 60s bpm -Continue Lipitor 80 mg daily -A1c pending -TSH normal -Await TTE -Cardiac rehab recommended   2. HLD: -Lipitor as above -Will need follow up lipid and liver panel in 6-8 weeks as an outpatient  3. Pulmonary nodules: -Pulmonary nodules noted in on  chest CT in 2008. Certainly concerning for possible malignant process -Defer further work-up to hospitalist service.  4. Polymyalgia rheumatica: -Appears this was being treated by his PCP (Dr. Army Melia), though it appears the patient has had spotty follow-up. He currently uses prednisone on a PRN basis -Defer management to PCP and hospitalist service  5. Dispo: -Can likely be transferred out of ICU this evening (interventional cardiology wants 24 hours in ICU care)   Signed, Christell Faith, PA-C [place] Pager: [phone] 04/11/2016, 7:20 AM

## 2016-04-11 NOTE — Progress Notes (Signed)
[facility] [place] NAME: Clinton Walsh    MR#:  [id number]  DATE OF BIRTH:  1960-11-24  SUBJECTIVE:  CHIEF COMPLAINT:   Chief Complaint  Patient presents with  . Chest Pain   No further chest pain No SOB  REVIEW OF SYSTEMS:    Review of Systems  Constitutional: Negative for chills and fever.  HENT: Negative for sore throat.   Eyes: Negative for blurred vision, double vision and pain.  Respiratory: Negative for cough, hemoptysis, shortness of breath and wheezing.   Cardiovascular: Positive for chest pain. Negative for palpitations, orthopnea and leg swelling.  Gastrointestinal: Negative for abdominal pain, constipation, diarrhea, heartburn, nausea and vomiting.  Genitourinary: Negative for dysuria and hematuria.  Musculoskeletal: Negative for back pain and joint pain.  Skin: Negative for rash.  Neurological: Negative for sensory change, speech change, focal weakness and headaches.  Endo/Heme/Allergies: Does not bruise/bleed easily.  Psychiatric/Behavioral: Negative for depression. The patient is not nervous/anxious.    DRUG ALLERGIES:   Allergies  Allergen Reactions  . Sulfa Antibiotics Other (See Comments)    unknown    VITALS:  Blood pressure 117/85, pulse (!) 54, temperature (!) 96.7 F (35.9 C), temperature source Axillary, resp. rate 15, height '5\' 5"'$  (1.651 m), weight 55.5 kg (122 lb 5.7 oz), SpO2 96 %.  PHYSICAL EXAMINATION:   Physical Exam  GENERAL:  56 y.o.-year-old patient lying in the bed with no acute distress.  EYES: Pupils equal, round, reactive to light and accommodation. No scleral icterus. Extraocular muscles intact.  HEENT: Head atraumatic, normocephalic. Oropharynx and nasopharynx clear.  NECK:  Supple, no jugular venous distention. No thyroid enlargement, no tenderness.  LUNGS: Normal breath sounds bilaterally, no wheezing, rales, rhonchi. No use of accessory muscles of respiration.  CARDIOVASCULAR:  S1, S2 normal. No murmurs, rubs, or gallops.  ABDOMEN: Soft, nontender, nondistended. Bowel sounds present. No organomegaly or mass.  EXTREMITIES: No cyanosis, clubbing or edema b/l.    NEUROLOGIC: Cranial nerves II through XII are intact. No focal Motor or sensory deficits b/l.   PSYCHIATRIC: The patient is alert and oriented x 3.  SKIN: No obvious rash, lesion, or ulcer.   LABORATORY PANEL:   CBC  Recent Labs Lab 04/11/16 0454  WBC 4.6  HGB 13.7  HCT 40.2  PLT 224   ------------------------------------------------------------------------------------------------------------------ Chemistries   Recent Labs Lab 04/10/16 1415 04/11/16 0454  NA 137 138  K 3.8 3.8  CL 106 109  CO2 22 24  GLUCOSE 95 88  BUN 20 18  CREATININE 0.92 0.93  CALCIUM 9.3 8.4*  AST 24  --   ALT 14*  --   ALKPHOS 63  --   BILITOT 0.4  --    ------------------------------------------------------------------------------------------------------------------  Cardiac Enzymes  Recent Labs Lab 04/11/16 0454  TROPONINI 3.56*   ------------------------------------------------------------------------------------------------------------------  RADIOLOGY:  Dg Chest Portable 1 View  Result Date: 04/10/2016 CLINICAL DATA:  Right-sided chest pain. EXAM: PORTABLE CHEST 1 VIEW COMPARISON:  September 18, 2011 FINDINGS: A transcutaneous pacer lead overlies the left side of the chest. There is a new opacity over the lateral left upper lobe, partially obscured by the transcutaneous pacer. No other nodules or masses are identified. No pneumothorax. The heart size in is borderline. The hila and mediastinum are unremarkable. No other acute abnormalities. IMPRESSION: 1. New opacity in the left upper lung could represent acute infiltrate or scarring new since September 2013. Underlying neoplasm not completely excluded on this single study. In the setting  of acute symptoms, a short-term follow-up chest x-ray in 2 or 3  weeks could be utilized to further assess. In the absence of acute symptoms, recommend a chest CT for better evaluation. Electronically Signed   By: Dorise Bullion III M.D   On: 04/10/2016 14:35   Ct Angio Chest/abd/pel For Dissection W And/or W/wo  Result Date: 04/10/2016 CLINICAL DATA:  Chest pain and abdominal pain radiating toward back EXAM: CT ANGIOGRAPHY CHEST, ABDOMEN AND PELVIS TECHNIQUE: Initially, axial CT images were obtained through the chest without intravenous contrast material administration. Multidetector CT imaging through the chest, abdomen and pelvis was performed using the standard protocol during bolus administration of intravenous contrast. Multiplanar reconstructed images and MIPs were obtained and reviewed to evaluate the vascular anatomy. CONTRAST:  100 mL Isovue 370 nonionic COMPARISON:  Chest CT October 11, 2006; chest radiograph April 10, 2016 ; chest radiograph September 18, 2011 FINDINGS: CTA CHEST FINDINGS Cardiovascular: There is no intramural hematoma in the thoracic spine on the noncontrast enhanced images. The ascending thoracic aorta measures 4.0 x 3.9 cm. There is no thoracic aortic dissection. The visualized great vessels show no aneurysm or dissection. There is no appreciable obstruction in these vessels. There are foci of coronary artery calcification. The pericardium is not appreciably thickened. There is no demonstrable pulmonary embolus. Mediastinum/Nodes: Visualized thyroid appears normal. There is a lymph node in the pretracheal region measuring 1.6 x 1.2 cm. There are lymph nodes adjacent to the descending thoracic aorta, largest measuring 1.3 x 1.0 cm. There is a the lymph node in the right hilum measuring 1.0 x 0.9 cm. Several slightly smaller lymph nodes are noted as well. Lungs/Pleura: There is underlying bullous emphysematous change. There are is a somewhat irregular nodular opacity with associated cicatrization in the anterior segment of the right upper lobe  measuring 2.1 x 0.8 cm. There is a a nodular opacity in the lateral segment of the right lower lobe measuring 4 mm, best seen on axial slice 51 series 4. There is a 2 mm nodular opacity abutting the pleura in the superior segment of the right lower lobe on axial slice 48 series 4. There is bibasilar lung scarring. There is no lung edema or consolidation. There is no pleural effusion or pleural thickening. Musculoskeletal: There are no blastic or lytic bone lesions. Review of the MIP images confirms the above findings. CTA ABDOMEN AND PELVIS FINDINGS VASCULAR Aorta: There is no demonstrable thoracic aortic dissection. There is atherosclerotic calcification in the aorta. There is dilatation of the abdominal aorta focally slightly inferior to the renal arteries with a maximum transverse diameter of 3.0 x 2.6 cm. There is patchy atherosclerotic calcification in the aorta without high-grade obstruction. Celiac: No aneurysm or dissection. The celiac artery and its major branches are patent. SMA: No aneurysm or dissection. The superior mesenteric artery its major branches are widely patent. Renals: There is a single renal artery on the right. There are 2 adjacent renal arteries on the left. No aneurysm or dissection. No fibromuscular dysplasia evident. These vessels are widely patent bilaterally. IMA: Inferior mesenteric artery is patent without aneurysm or dissection. Inflow: The pelvic arterial vessels show no aneurysm or dissection. The common iliac arteries are tortuous and contain calcification without hemodynamically significant obstruction. Scattered foci of calcification are noted in the right hypogastric artery. No hemodynamically significant obstruction is seen in the major pelvic arterial vessels. Veins: No obvious venous abnormality within the limitations of this arterial phase study. Review of the MIP images confirms the  above findings. NON-VASCULAR Hepatobiliary: No focal liver lesions are evident on this  study which was performed in arterial phase. Gallbladder wall is not appreciably thickened. There is no biliary duct dilatation. Pancreas: There is no pancreatic mass or inflammatory focus. Spleen: No splenic lesions are evident. Spleen is normal in size and contour. Adrenals/Urinary Tract: Adrenals appear normal bilaterally. Kidneys bilaterally show no mass or hydronephrosis on either side. There is no renal or ureteral calculus on either side. Urinary bladder is distended with wall thickness within normal limits. Stomach/Bowel: There is no appreciable bowel wall or mesenteric thickening. There is moderate stool throughout the colon. There is no bowel obstruction. No free air or portal venous air. Lymphatic: There is evidence of adenopathy in the periaortic regions to the left and to the right of the aorta at the level of the renal arteries. The largest lymph node to the left of the aorta measures 2.4 x 1.8 cm. Adenopathy noted to the right of the aorta at this level measures 2.4 x 1.5 cm. No other adenopathy is appreciable on this study. Reproductive: Prostate and seminal vesicles appear unremarkable in size and contour. No pelvic masses appreciable. Other: Appendix absent. No abscess or ascites evident in the abdomen or pelvis. Musculoskeletal: There is age uncertain anterior wedging of the L1 vertebral body. No other fracture evident. No blastic or lytic bone lesions. There is degenerative change in the lumbar spine. There is no intramuscular or abdominal wall lesion. Review of the MIP images confirms the above findings. IMPRESSION: CT angiogram chest: No thoracic aortic dissection. Ascending thoracic aorta measures 4.0 x 3.9 cm in diameter. Recommend annual imaging followup by CTA or MRA. This recommendation follows 2010 ACCF/AHA/AATS/ACR/ASA/SCA/SCAI/SIR/STS/SVM Guidelines for the Diagnosis and Management of Patients with Thoracic Aortic Disease. Circulation. 2010; 121: O270-J500. No evident pulmonary embolus.  No pericardial thickening. Irregular opacity in the left upper lobe anterior segment measuring 2.1 x 0.8 cm, not present on studies prior to today. Smaller nodular lesions also noted in the right lower lobe. This irregular opacity potentially could represent a scar type carcinoma. Correlation with nuclear medicine PET study advised to assess for potential abnormal metabolic activity. There is underlying centrilobular emphysematous change with multiple bullae. Several mildly enlarged lymph nodes. The possibility of neoplastic involvement given the lesion in the left upper lobe must be of concern. Again, nuclear medicine PET study would be helpful to assess degree of metabolic activity in these areas. CT angiogram abdomen and pelvis: Atherosclerotic calcification in the aorta and major pelvic arteries. No hemodynamically significant obstructing lesions apparent. There is dilatation in the mid thoracic aorta maximum transverse diameter of 3.0 x 2.6 cm. Recommend followup by ultrasound in 3 years. This recommendation follows ACR consensus guidelines: White Paper of the ACR Incidental Findings Committee II on Vascular Findings. J Am Coll Radiol 2013; 10:789-794. No demonstrable dissection in the aorta or major abdominal/pelvic arterial vessels. Evidence of retroperitoneal adenopathy in the mid aortic region. Neoplastic etiology must be of concern, particularly given the findings in the chest. Correlation with nuclear medicine PET study again advised to assess for abnormal metabolic activity in these lymph nodes as well. Distended urinary bladder without wall thickening. No hydronephrosis. No renal or ureteral calculus on either side. No bowel obstruction.  No abscess.  Appendix absent. Age uncertain anterior wedging of the L1 vertebral body. No retropulsion of bone. Electronically Signed   By: Lowella Grip III M.D.   On: 04/10/2016 15:30     ASSESSMENT AND PLAN:  56 year old male with past medical history of  polymyalgia rheumatica, coronary artery disease status post stent placement, hypertension, tobacco abuse, history of pulmonary nodules who presents to the hospital with chest pain and noted to have an ST elevation MI.  * STEMI with PCI to [place] cardiology help. ASA, Brilinta, BB, Statin.  * Ischemic cardiomyopathy with EF 30-35 % OP cardiology f/u Coreg. ACEi if BP tolerates  * Hx of PMR - no acute complaints.  - currently not on maintenance prednisone.   * Hx of Pulm. Nodules - pt. had a CT chest done which shows pulmonary nodules and irregular opacity in the left upper lobe. Outpatient follow-up with pulmonary.  * Hypertension-continue carvedilol  * Hyperlipidemia-continue atorvastatin.  All the records are reviewed and case discussed with Care Management/Social Worker Management plans discussed with the patient, family and they are in agreement.  CODE STATUS: FULL CODE  DVT Prophylaxis: SCDs  TOTAL TIME TAKING CARE OF THIS PATIENT: 30 minutes.   POSSIBLE D/C IN 1-2 DAYS, DEPENDING ON CLINICAL CONDITION.  Hillary Bow R M.D on 04/11/2016 at 1:05 PM  Between 7am to 6pm - Pager - [phone]  After 6pm go to [email or web address] - password [email or web address] [facility]  Office  [phone]  CC: Primary care physician; Halina Maidens, MD  Note: This dictation was prepared with Dragon dictation along with smaller phrase technology. Any transcriptional errors that result from this process are unintentional.

## 2016-04-12 ENCOUNTER — Telehealth: Payer: Self-pay | Admitting: Internal Medicine

## 2016-04-12 ENCOUNTER — Other Ambulatory Visit: Payer: Self-pay | Admitting: *Deleted

## 2016-04-12 ENCOUNTER — Other Ambulatory Visit: Payer: Self-pay | Admitting: Internal Medicine

## 2016-04-12 LAB — HEMOGLOBIN A1C
Hgb A1c MFr Bld: 5.7 % — ABNORMAL HIGH (ref 4.8–5.6)
Mean Plasma Glucose: 117 mg/dL

## 2016-04-12 MED ORDER — LISINOPRIL 5 MG PO TABS: 5.0000 mg | ORAL_TABLET | Freq: Every day | ORAL | 0 refills | Status: DC

## 2016-04-12 MED ORDER — CARVEDILOL 3.125 MG PO TABS: 3.1250 mg | ORAL_TABLET | Freq: Two times a day (BID) | ORAL | 0 refills | Status: DC

## 2016-04-12 MED ORDER — ATORVASTATIN CALCIUM 80 MG PO TABS: 80.0000 mg | ORAL_TABLET | Freq: Every day | ORAL | 0 refills | Status: DC

## 2016-04-12 MED ORDER — CARVEDILOL 6.25 MG PO TABS: 6.2500 mg | ORAL_TABLET | Freq: Two times a day (BID) | ORAL | Status: DC

## 2016-04-12 MED ORDER — TICAGRELOR 90 MG PO TABS: 90.0000 mg | ORAL_TABLET | Freq: Two times a day (BID) | ORAL | 0 refills | Status: DC

## 2016-04-12 MED ORDER — ASPIRIN EC 81 MG PO TBEC: 81.0000 mg | DELAYED_RELEASE_TABLET | Freq: Every day | ORAL | 0 refills | Status: DC

## 2016-04-12 NOTE — Telephone Encounter (Signed)
TCM.... Pt is being discharged today Saw Dr End in hospital  He is coming on 04/24/16 to see Dr End

## 2016-04-12 NOTE — Discharge Summary (Signed)
[facility] [place] NAME: Clinton Walsh    MR#:  [id number]  DATE OF BIRTH:  08-09-1960  DATE OF ADMISSION:  04/10/2016 ADMITTING PHYSICIAN: [name], MD  DATE OF DISCHARGE: 04/12/2016 12:35 PM  PRIMARY CARE PHYSICIAN: Halina Maidens, MD   ADMISSION DIAGNOSIS:  Chest pain [R07.9] STEMI involving right coronary artery ([place]) [I21.11]  DISCHARGE DIAGNOSIS:  Principal Problem:   STEMI (ST elevation myocardial infarction) [facility]) Active Problems:   STEMI involving right coronary artery ([place])   Chest pain   SECONDARY DIAGNOSIS:   Past Medical History:  Diagnosis Date  . CAD (coronary artery disease)    a.  2008 Cath:  nonobstructive plaque, NL LV;  b. 03/2011 NSTEMI/Cath:  100% D1, otw nonobs dzs, EF 45-50%.  D1 stented w/  2.25x18 MiniVision BMS. Presented on 04/14/11 with NSTEMI. Cath showed stent thrmobosis likely due to not taking DAPT.  Treated with thrombectomy and angioplasty.   . Hyperlipidemia   . Myocardial infarct   . Pulmonary nodules    nonspecific nodules in the right middle lobe  and right lower lobe by CT '08  . Tobacco abuse      ADMITTING HISTORY  HISTORY OF PRESENT ILLNESS:  Clinton Walsh  is a 56 y.o. male with a known history of Coronary Artery disease status post and placement in 2013, polymyalgia rheumatica, tobacco abuse, history of pulmonary nodules, hypertension, hyperlipidemia who presents to the hospital due to chest pain. 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 He denied any shortness of breath palpitations dizziness or syncope. Given his previous history of coronary disease he presented to the ER for further evaluation and was noted to have EKG changes consistent with inferior wall MI. Patient was urgently taken to the cardiac catheterization lab and underwent drug-eluting stent  placement to the RCA where he had intramural thrombosis. Currently patient is chest pain-free and hemodynamically stable. Hospitalist services were contacted for admission.   HOSPITAL COURSE:   * ST elevation MI with PCI to RCA with drug-eluting stent Patient was admitted to ICU after catheterization. Monitor overnight. Started on aspirin, Brilinta, Lipitor, Coreg and lisinopril. Followed by cardiology during the hospital stay. By the day of discharge patient has ambulated without any problems. No further chest pain. No arrhythmias on telemetry. Coupon for [employer] given. Referral to cardiac rehabilitation.  * Ischemic cardiomyopathy with ejection fraction of 35%. This is a new diagnosis and with optimal treatment hopefully this will improve. He will follow-up with cardiology as outpatient. If there is no improvement a LifeVest needs to be considered.  Stable for discharge home with cardiology follow-up in one week.  CONSULTS OBTAINED:  Treatment Team:  [name], MD  DRUG ALLERGIES:   Allergies  Allergen Reactions  . Sulfa Antibiotics Other (See Comments)    unknown    DISCHARGE MEDICATIONS:   Discharge Medication List as of 04/12/2016 11:54 AM    START taking these medications   Details  aspirin EC 81 MG tablet Take 1 tablet (81 mg total) by mouth daily., Starting Thu 04/12/2016, Normal    atorvastatin (LIPITOR) 80 MG tablet Take 1 tablet (80 mg total) by mouth daily at 6 PM., Starting Thu 04/12/2016, Normal    carvedilol (COREG) 3.125 MG tablet Take 1 tablet (3.125 mg total) by mouth 2 (two) times daily with a meal., Starting Thu 04/12/2016, Normal  lisinopril (PRINIVIL,ZESTRIL) 5 MG tablet Take 1 tablet (5 mg total) by mouth daily., Starting Fri 04/13/2016, Normal    ticagrelor (BRILINTA) 90 MG TABS tablet Take 1 tablet (90 mg total) by mouth 2 (two) times daily., Starting Thu 04/12/2016, Normal      CONTINUE these medications which have NOT CHANGED   Details   predniSONE (DELTASONE) 10 MG tablet Take 10 mg by mouth daily as needed., Historical Med    nicotine (RA NICOTINE) 14 mg/24hr patch Place 1 patch onto the skin daily., Starting Thu 02/03/2015, Historical Med      STOP taking these medications     Aspirin-Salicylamide-Caffeine (BC HEADACHE POWDER PO)      famotidine (PEPCID) 20 MG tablet      HYDROcodone-acetaminophen (NORCO) 5-325 MG tablet      ketorolac (ACULAR) 0.4 % SOLN         Today   VITAL SIGNS:  Blood pressure 119/78, pulse 76, temperature 98.3 F (36.8 C), temperature source Oral, resp. rate 18, height '5\' 5"'$  (1.651 m), weight 55.5 kg (122 lb 5.7 oz), SpO2 97 %.  I/O:   Intake/Output Summary (Last 24 hours) at 04/12/16 1357 Last data filed at 04/12/16 0900  Gross per 24 hour  Intake              120 ml  Output              290 ml  Net             -170 ml    PHYSICAL EXAMINATION:  Physical Exam  GENERAL:  56 y.o.-year-old patient lying in the bed with no acute distress.  LUNGS: Normal breath sounds bilaterally, no wheezing, rales,rhonchi or crepitation. No use of accessory muscles of respiration.  CARDIOVASCULAR: S1, S2 normal. No murmurs, rubs, or gallops.  ABDOMEN: Soft, non-tender, non-distended. Bowel sounds present. No organomegaly or mass.  NEUROLOGIC: Moves all 4 extremities. PSYCHIATRIC: The patient is alert and oriented x 3.  SKIN: No obvious rash, lesion, or ulcer.   DATA REVIEW:   CBC  Recent Labs Lab 04/11/16 0454  WBC 4.6  HGB 13.7  HCT 40.2  PLT 224    Chemistries   Recent Labs Lab 04/10/16 1415 04/11/16 0454  NA 137 138  K 3.8 3.8  CL 106 109  CO2 22 24  GLUCOSE 95 88  BUN 20 18  CREATININE 0.92 0.93  CALCIUM 9.3 8.4*  AST 24  --   ALT 14*  --   ALKPHOS 63  --   BILITOT 0.4  --     Cardiac Enzymes  Recent Labs Lab 04/11/16 0454  TROPONINI 3.56*    Microbiology Results  Results for orders placed or performed during the hospital encounter of 04/10/16  MRSA  PCR Screening     Status: None   Collection Time: 04/10/16  5:18 PM  Result Value Ref Range Status   MRSA by PCR NEGATIVE NEGATIVE Final    Comment:        The GeneXpert MRSA Assay (FDA approved for NASAL specimens only), is one component of a comprehensive MRSA colonization surveillance program. It is not intended to diagnose MRSA infection nor to guide or monitor treatment for MRSA infections.     RADIOLOGY:  Dg Chest Portable 1 View  Result Date: 04/10/2016 CLINICAL DATA:  Right-sided chest pain. EXAM: PORTABLE CHEST 1 VIEW COMPARISON:  September 18, 2011 FINDINGS: A transcutaneous pacer lead overlies the left side of the chest. There is a  new opacity over the lateral left upper lobe, partially obscured by the transcutaneous pacer. No other nodules or masses are identified. No pneumothorax. The heart size in is borderline. The hila and mediastinum are unremarkable. No other acute abnormalities. IMPRESSION: 1. New opacity in the left upper lung could represent acute infiltrate or scarring new since September 2013. Underlying neoplasm not completely excluded on this single study. In the setting of acute symptoms, a short-term follow-up chest x-ray in 2 or 3 weeks could be utilized to further assess. In the absence of acute symptoms, recommend a chest CT for better evaluation. Electronically Signed   By: Dorise Bullion III M.D   On: 04/10/2016 14:35   Ct Angio Chest/abd/pel For Dissection W And/or W/wo  Result Date: 04/10/2016 CLINICAL DATA:  Chest pain and abdominal pain radiating toward back EXAM: CT ANGIOGRAPHY CHEST, ABDOMEN AND PELVIS TECHNIQUE: Initially, axial CT images were obtained through the chest without intravenous contrast material administration. Multidetector CT imaging through the chest, abdomen and pelvis was performed using the standard protocol during bolus administration of intravenous contrast. Multiplanar reconstructed images and MIPs were obtained and reviewed to  evaluate the vascular anatomy. CONTRAST:  100 mL Isovue 370 nonionic COMPARISON:  Chest CT October 11, 2006; chest radiograph April 10, 2016 ; chest radiograph September 18, 2011 FINDINGS: CTA CHEST FINDINGS Cardiovascular: There is no intramural hematoma in the thoracic spine on the noncontrast enhanced images. The ascending thoracic aorta measures 4.0 x 3.9 cm. There is no thoracic aortic dissection. The visualized great vessels show no aneurysm or dissection. There is no appreciable obstruction in these vessels. There are foci of coronary artery calcification. The pericardium is not appreciably thickened. There is no demonstrable pulmonary embolus. Mediastinum/Nodes: Visualized thyroid appears normal. There is a lymph node in the pretracheal region measuring 1.6 x 1.2 cm. There are lymph nodes adjacent to the descending thoracic aorta, largest measuring 1.3 x 1.0 cm. There is a the lymph node in the right hilum measuring 1.0 x 0.9 cm. Several slightly smaller lymph nodes are noted as well. Lungs/Pleura: There is underlying bullous emphysematous change. There are is a somewhat irregular nodular opacity with associated cicatrization in the anterior segment of the right upper lobe measuring 2.1 x 0.8 cm. There is a a nodular opacity in the lateral segment of the right lower lobe measuring 4 mm, best seen on axial slice 51 series 4. There is a 2 mm nodular opacity abutting the pleura in the superior segment of the right lower lobe on axial slice 48 series 4. There is bibasilar lung scarring. There is no lung edema or consolidation. There is no pleural effusion or pleural thickening. Musculoskeletal: There are no blastic or lytic bone lesions. Review of the MIP images confirms the above findings. CTA ABDOMEN AND PELVIS FINDINGS VASCULAR Aorta: There is no demonstrable thoracic aortic dissection. There is atherosclerotic calcification in the aorta. There is dilatation of the abdominal aorta focally slightly inferior to  the renal arteries with a maximum transverse diameter of 3.0 x 2.6 cm. There is patchy atherosclerotic calcification in the aorta without high-grade obstruction. Celiac: No aneurysm or dissection. The celiac artery and its major branches are patent. SMA: No aneurysm or dissection. The superior mesenteric artery its major branches are widely patent. Renals: There is a single renal artery on the right. There are 2 adjacent renal arteries on the left. No aneurysm or dissection. No fibromuscular dysplasia evident. These vessels are widely patent bilaterally. IMA: Inferior mesenteric artery is patent  without aneurysm or dissection. Inflow: The pelvic arterial vessels show no aneurysm or dissection. The common iliac arteries are tortuous and contain calcification without hemodynamically significant obstruction. Scattered foci of calcification are noted in the right hypogastric artery. No hemodynamically significant obstruction is seen in the major pelvic arterial vessels. Veins: No obvious venous abnormality within the limitations of this arterial phase study. Review of the MIP images confirms the above findings. NON-VASCULAR Hepatobiliary: No focal liver lesions are evident on this study which was performed in arterial phase. Gallbladder wall is not appreciably thickened. There is no biliary duct dilatation. Pancreas: There is no pancreatic mass or inflammatory focus. Spleen: No splenic lesions are evident. Spleen is normal in size and contour. Adrenals/Urinary Tract: Adrenals appear normal bilaterally. Kidneys bilaterally show no mass or hydronephrosis on either side. There is no renal or ureteral calculus on either side. Urinary bladder is distended with wall thickness within normal limits. Stomach/Bowel: There is no appreciable bowel wall or mesenteric thickening. There is moderate stool throughout the colon. There is no bowel obstruction. No free air or portal venous air. Lymphatic: There is evidence of adenopathy in  the periaortic regions to the left and to the right of the aorta at the level of the renal arteries. The largest lymph node to the left of the aorta measures 2.4 x 1.8 cm. Adenopathy noted to the right of the aorta at this level measures 2.4 x 1.5 cm. No other adenopathy is appreciable on this study. Reproductive: Prostate and seminal vesicles appear unremarkable in size and contour. No pelvic masses appreciable. Other: Appendix absent. No abscess or ascites evident in the abdomen or pelvis. Musculoskeletal: There is age uncertain anterior wedging of the L1 vertebral body. No other fracture evident. No blastic or lytic bone lesions. There is degenerative change in the lumbar spine. There is no intramuscular or abdominal wall lesion. Review of the MIP images confirms the above findings. IMPRESSION: CT angiogram chest: No thoracic aortic dissection. Ascending thoracic aorta measures 4.0 x 3.9 cm in diameter. Recommend annual imaging followup by CTA or MRA. This recommendation follows 2010 ACCF/AHA/AATS/ACR/ASA/SCA/SCAI/SIR/STS/SVM Guidelines for the Diagnosis and Management of Patients with Thoracic Aortic Disease. Circulation. 2010; 121: U314-H702. No evident pulmonary embolus. No pericardial thickening. Irregular opacity in the left upper lobe anterior segment measuring 2.1 x 0.8 cm, not present on studies prior to today. Smaller nodular lesions also noted in the right lower lobe. This irregular opacity potentially could represent a scar type carcinoma. Correlation with nuclear medicine PET study advised to assess for potential abnormal metabolic activity. There is underlying centrilobular emphysematous change with multiple bullae. Several mildly enlarged lymph nodes. The possibility of neoplastic involvement given the lesion in the left upper lobe must be of concern. Again, nuclear medicine PET study would be helpful to assess degree of metabolic activity in these areas. CT angiogram abdomen and pelvis:  Atherosclerotic calcification in the aorta and major pelvic arteries. No hemodynamically significant obstructing lesions apparent. There is dilatation in the mid thoracic aorta maximum transverse diameter of 3.0 x 2.6 cm. Recommend followup by ultrasound in 3 years. This recommendation follows ACR consensus guidelines: White Paper of the ACR Incidental Findings Committee II on Vascular Findings. J Am Coll Radiol 2013; 10:789-794. No demonstrable dissection in the aorta or major abdominal/pelvic arterial vessels. Evidence of retroperitoneal adenopathy in the mid aortic region. Neoplastic etiology must be of concern, particularly given the findings in the chest. Correlation with nuclear medicine PET study again advised to assess for abnormal  metabolic activity in these lymph nodes as well. Distended urinary bladder without wall thickening. No hydronephrosis. No renal or ureteral calculus on either side. No bowel obstruction.  No abscess.  Appendix absent. Age uncertain anterior wedging of the L1 vertebral body. No retropulsion of bone. Electronically Signed   By: Lowella Grip III M.D.   On: 04/10/2016 15:30    Follow up with PCP in 1 week.  Management plans discussed with the patient, family and they are in agreement.  CODE STATUS:     Code Status Orders        Start     Ordered   04/10/16 1656  Full code  Continuous     04/10/16 1656    Code Status History    Date Active Date Inactive Code Status Order ID Comments User Context   03/16/2011 10:20 PM 03/18/2011  5:45 PM Full Code [id number]  Renee Rival, RN Inpatient      TOTAL TIME TAKING CARE OF THIS PATIENT ON DAY OF DISCHARGE: more than 30 minutes.   Hillary Bow R M.D on 04/12/2016 at 1:57 PM  Between 7am to 6pm - Pager - [phone]  After 6pm go to [email or web address] - password [email or web address] [facility]  Office  [phone]  CC: Primary care physician; Halina Maidens, MD  Note: This dictation was prepared  with Dragon dictation along with smaller phrase technology. Any transcriptional errors that result from this process are unintentional.

## 2016-04-12 NOTE — Progress Notes (Signed)
Patient Name: Clinton Walsh Date of Encounter: 04/12/2016  Primary Cardiologist: prior patient of Fletcher Anon several years ago, lost to follow up (consult by End)  Hospital Problem List     Principal Problem:   STEMI (ST elevation myocardial infarction) ([place]) Active Problems:   STEMI involving right coronary artery ([place])   Chest pain     Subjective   Presented with inferior ST elevation MI with LHC showing non-occlusive thrombus in the proximal/mid RCA, likely representing acute plaque rupture and spontaneous recanalization of thrombotic occlusion. This was successfully treated with a single drug eluting stent. TTE showed reduced EF of 30-35% felt to be out of proportion with CAD (likely mixed CM). No chest pain or SOB. Has ambulated without issues. Tolerating medications.   Inpatient Medications    Scheduled Meds: . aspirin  81 mg Oral Daily  . atorvastatin  80 mg Oral q1800  . carvedilol  3.125 mg Oral BID WC  . enoxaparin (LOVENOX) injection  40 mg Subcutaneous Q24H  . lisinopril  5 mg Oral Daily  . pneumococcal 23 valent vaccine  0.5 mL Intramuscular Tomorrow-1000  . sodium chloride flush  3 mL Intravenous Q12H  . ticagrelor  90 mg Oral BID   Continuous Infusions:  PRN Meds: sodium chloride, acetaminophen, ondansetron (ZOFRAN) IV, sodium chloride flush   Vital Signs    Vitals:   04/11/16 1736 04/11/16 1940 04/12/16 0430 04/12/16 0755  BP: (!) 152/83 (!) 150/90 121/73 119/78  Pulse: 63 69 64 76  Resp: 18  18   Temp: 97.9 F (36.6 C) 98.2 F (36.8 C) 97.8 F (36.6 C) 98.3 F (36.8 C)  TempSrc: Oral Oral  Oral  SpO2: 98% 98% 96% 97%  Weight:      Height:        Intake/Output Summary (Last 24 hours) at 04/12/16 0800 Last data filed at 04/12/16 0430  Gross per 24 hour  Intake                0 ml  Output              590 ml  Net             -590 ml   Filed Weights   04/10/16 1418 04/10/16 1707  Weight: 130 lb (59 kg) 122 lb 5.7 oz (55.5 kg)     Physical Exam    GEN: Well nourished, well developed, in no acute distress.  HEENT: Grossly normal.  Neck: Supple, no JVD, carotid bruits, or masses. Cardiac: RRR, no murmurs, rubs, or gallops. No clubbing, cyanosis, edema.  Radials/DP/PT 2+ and equal bilaterally. Right radial cath site without bleeding, bruising, swelling, or erythema. Pulse 2+. Respiratory:  Respirations regular and unlabored, clear to auscultation bilaterally. GI: Soft, nontender, nondistended, BS + x 4. MS: no deformity or atrophy. Skin: warm and dry, no rash. Neuro:  Strength and sensation are intact. Psych: AAOx3.  Normal affect.  Labs    CBC  Recent Labs  04/10/16 1415 04/10/16 2240 04/11/16 0454  WBC 7.1  --  4.6  HGB 15.3  --  13.7  HCT 44.3  --  40.2  MCV 87.7  --  89.7  PLT 260 229 829   Basic Metabolic Panel  Recent Labs  04/10/16 1415 04/11/16 0454  NA 137 138  K 3.8 3.8  CL 106 109  CO2 22 24  GLUCOSE 95 88  BUN 20 18  CREATININE 0.92 0.93  CALCIUM 9.3 8.4*  Liver Function Tests  Recent Labs  04/10/16 1415  AST 24  ALT 14*  ALKPHOS 63  BILITOT 0.4  PROT 8.2*  ALBUMIN 4.2   No results for input(s): LIPASE, AMYLASE in the last 72 hours. Cardiac Enzymes  Recent Labs  04/10/16 1741 04/10/16 2240 04/11/16 0454  TROPONINI 1.14* 4.35* 3.56*   BNP Invalid input(s): POCBNP D-Dimer No results for input(s): DDIMER in the last 72 hours. Hemoglobin A1C  Recent Labs  04/11/16 0454  HGBA1C 5.7*   Fasting Lipid Panel  Recent Labs  04/11/16 0454  CHOL 183  HDL 34*  LDLCALC 131*  TRIG 90  CHOLHDL 5.4   Thyroid Function Tests  Recent Labs  04/11/16 0454  TSH 4.307    Telemetry    Sinus rhythm 70s bpm, episodes of sinus bradycardia into the 50s bpm overnight - Personally Reviewed  ECG    n/a - Personally Reviewed  Radiology    Dg Chest Portable 1 View  Result Date: 04/10/2016 IMPRESSION: 1. New opacity in the left upper lung could represent  acute infiltrate or scarring new since September 2013. Underlying neoplasm not completely excluded on this single study. In the setting of acute symptoms, a short-term follow-up chest x-ray in 2 or 3 weeks could be utilized to further assess. In the absence of acute symptoms, recommend a chest CT for better evaluation. Electronically Signed   By: Dorise Bullion III M.D   On: 04/10/2016 14:35   Ct Angio Chest/abd/pel For Dissection W And/or W/wo  Result Date: 04/10/2016 IMPRESSION: CT angiogram chest: No thoracic aortic dissection. Ascending thoracic aorta measures 4.0 x 3.9 cm in diameter. Recommend annual imaging followup by CTA or MRA. This recommendation follows 2010 ACCF/AHA/AATS/ACR/ASA/SCA/SCAI/SIR/STS/SVM Guidelines for the Diagnosis and Management of Patients with Thoracic Aortic Disease. Circulation. 2010; 121: Y865-H846. No evident pulmonary embolus. No pericardial thickening. Irregular opacity in the left upper lobe anterior segment measuring 2.1 x 0.8 cm, not present on studies prior to today. Smaller nodular lesions also noted in the right lower lobe. This irregular opacity potentially could represent a scar type carcinoma. Correlation with nuclear medicine PET study advised to assess for potential abnormal metabolic activity. There is underlying centrilobular emphysematous change with multiple bullae. Several mildly enlarged lymph nodes. The possibility of neoplastic involvement given the lesion in the left upper lobe must be of concern. Again, nuclear medicine PET study would be helpful to assess degree of metabolic activity in these areas. CT angiogram abdomen and pelvis: Atherosclerotic calcification in the aorta and major pelvic arteries. No hemodynamically significant obstructing lesions apparent. There is dilatation in the mid thoracic aorta maximum transverse diameter of 3.0 x 2.6 cm. Recommend followup by ultrasound in 3 years. This recommendation follows ACR consensus guidelines: White  Paper of the ACR Incidental Findings Committee II on Vascular Findings. J Am Coll Radiol 2013; 10:789-794. No demonstrable dissection in the aorta or major abdominal/pelvic arterial vessels. Evidence of retroperitoneal adenopathy in the mid aortic region. Neoplastic etiology must be of concern, particularly given the findings in the chest. Correlation with nuclear medicine PET study again advised to assess for abnormal metabolic activity in these lymph nodes as well. Distended urinary bladder without wall thickening. No hydronephrosis. No renal or ureteral calculus on either side. No bowel obstruction.  No abscess.  Appendix absent. Age uncertain anterior wedging of the L1 vertebral body. No retropulsion of bone. Electronically Signed   By: Lowella Grip III M.D.   On: 04/10/2016 15:30    Cardiac Studies  LHC 04/10/2016: Conclusion   Conclusions: 1. Inferior STEMI with acute plaque rupture and thrombotic occlusion (spontaneous recanalization) of proximal/mid RCA. At time of catheterization, there was irregular stenosis (up to 90%) with intraluminal thrombus. 2. Mild to moderate, non-obstructive CAD involving the left coronary artery. 3. Patent bare metal stent in D1 with up to 20% in-stent restenosis. 4. Successful IVUS-guided PCI to proximal/mid RCA with placement of an Integrity Resolute 3.0 x 34 mm drug-eluting stent (post-dilated at high pressure with 3.5 mm [place] balloon) with 0% residual stenosis and TIMI-3 flow.   Recommendations: 1. Admit to ICU overnight for post-STEMI care. If stable, could be transferred to telemetry tomorrow afternoon. 2. Continue tirofiban for 8 hours, given significant thrombus burden at start of case. 3. Dual antiplatelet therapy with aspirin and ticagrelor for at least 12 months. Given history of stent thrombosis in 2013, I would be hesitant to use clopidogrel. 4. Aggressive secondary prevention, including high-intensity statin therapy. 5. Obtain transthoracic  echocardiogram before discharge.    TTE 04/10/2016: Study Conclusions  - Left ventricle: The cavity size was normal. Systolic function was   moderately to severely reduced. The estimated ejection fraction   was in the range of 30% to 35%. Diffuse hypokinesis. Hypokinesis   of the mid to distal inferior and inferolateral myocardium.   Doppler parameters are consistent with abnormal left ventricular   relaxation (grade 1 diastolic dysfunction). - Aortic valve: There was trivial regurgitation. - Mitral valve: There was mild to moderate regurgitation. - Left atrium: The atrium was normal in size. - Right ventricle: Systolic function was mildly reduced. - Pulmonary arteries: Systolic pressure was within the normal   range.   Patient Profile     56 y.o. male with history of CAD with occluded diagonal branch in setting of NSTEMI in 2013 that was treated with BMS by Dr. Lia Foyer. He [facility] with similar chest pain about 1 month later and was found to have subacute stent thrombosis, which was successfully treated with aspiration thrombectomy and balloon angioplasty. He has been lost to cardiology follow-up and does not take any medications on a regular basis. Patient presented to [facility] on 04/10/2016 with chest pain and was found to have subtle inferior ST-segment elevation of up to 1 mm (though baseline artifact limits evaluation s/p LHC that showed non-occlusive thrombus in the proximal/mid RCA, likely representing acute plaque rupture and spontaneous recanalization of thrombotic occlusion. This was successfully treated with a single drug eluting stent.  Assessment & Plan    1.  Inferior ST elevation MI: -STEMI due to acute plaque rupture with thrombotic occlusion of proximal/mid RCA (type 1 MI). By the time he reached the cath lab, the occlusion had spontaneously recanalized, though significant intraluminal thrombus remained. This was successfully treated with aspiration thrombectomy and  IVUS-guided PCI -Troponin peaked at 4.35, now down trending -Chest pain free, no SOB, ambulated without issues -Continue DAPT with ASA 81 mg daily and Brilinta 90 mg bid, the importance of DAPT was stressed to the patient and will continue to discuss with him daily throughout this admission -Continue Coreg 3/125 mg bid )heart rate precludes titration at this time), he has been bradycardic, though asymptomatic, currently with heart rate in the mid 60s bpm -Continue Lipitor 80 mg daily -A1c 5.7 -TSH normal -TTE as above -Cardiac rehab recommended  -Case manager consulted for assistance with Brilinta   2. Acute systolic CHF/mixed ICM/NICM: -He does not appear to be volume overloaded at this time -Titrate Coreg to 6.25  mg bid -Continue lisinopril 5 mg daily -As an outpatient consider addition of spironolactone -May need PRN Lasix -Will d/w MD regarding possible LifeVest prior to discharge given CM, though suspect this will not be indicated at this time -Will need outpatient repeat echo in ~ 3 months  3. HLD: -Lipitor as above -Will need follow up lipid and liver panel in 6-8 weeks as an outpatient  4. Pulmonary nodules: -Pulmonary nodules noted in on chest CT in 2008. Certainly concerning for possible malignant process -Defer further work-up to hospitalist service.  5. Polymyalgia rheumatica: -Appears this was being treated by his PCP (Dr. Army Melia), though it appears the patient has had spotty follow-up. He currently uses prednisone on a PRN basis -Defer management to PCP and hospitalist service  6. Dispo: -Will need TCM follow up in our office in 1 week  Signed, Marcille Blanco [place] Pager: [phone] 04/12/2016, 8:00 AM

## 2016-04-12 NOTE — Progress Notes (Signed)
Patient discharged per MD order and hospital protocol. Patient given coupon for Brilinta and patient education. Patient verbalized understanding of discharge instructions, appointments and medications. Patient escorted off unit via staff.

## 2016-04-13 NOTE — Telephone Encounter (Signed)
Patient contacted regarding discharge from [facility] on 04/12/16.  Patient understands to follow up with provider Dr. Saunders Revel on 04/24/16 at 2:40PM at [facility]. Patient understands discharge instructions? Yes Patient understands medications and regiment? Yes Patient understands to bring all medications to this visit? Yes  He declined any questions at this time and verbalized understanding of appointment information and time. Instructed him to call back if he should have any questions.

## 2016-04-13 NOTE — Telephone Encounter (Signed)
Left voicemail message to call back  

## 2016-04-19 ENCOUNTER — Telehealth: Payer: Self-pay | Admitting: *Deleted

## 2016-04-19 ENCOUNTER — Ambulatory Visit (INDEPENDENT_AMBULATORY_CARE_PROVIDER_SITE_OTHER): Payer: Managed Care, Other (non HMO) | Admitting: Internal Medicine

## 2016-04-19 ENCOUNTER — Encounter: Payer: Self-pay | Admitting: Internal Medicine

## 2016-04-19 VITALS — BP 102/66 | HR 78 | Ht 65.0 in | Wt 120.2 lb

## 2016-04-19 DIAGNOSIS — B029 Zoster without complications: Secondary | ICD-10-CM

## 2016-04-19 DIAGNOSIS — J431 Panlobular emphysema: Secondary | ICD-10-CM | POA: Diagnosis not present

## 2016-04-19 DIAGNOSIS — R9389 Abnormal findings on diagnostic imaging of other specified body structures: Secondary | ICD-10-CM | POA: Insufficient documentation

## 2016-04-19 DIAGNOSIS — I2111 ST elevation (STEMI) myocardial infarction involving right coronary artery: Secondary | ICD-10-CM

## 2016-04-19 DIAGNOSIS — I77819 Aortic ectasia, unspecified site: Secondary | ICD-10-CM | POA: Diagnosis not present

## 2016-04-19 DIAGNOSIS — R938 Abnormal findings on diagnostic imaging of other specified body structures: Secondary | ICD-10-CM
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
## 2016-04-19 NOTE — Telephone Encounter (Signed)
Pt has been added to Dr. Elroy Channel schedule at 9:30am.

## 2016-04-19 NOTE — Telephone Encounter (Signed)
Received call from Dr. Gaspar Cola office requesting medical oncology consultation regarding recent CT scan report with possible malignant findings. [place] appointment for 04/20/16 9:30am to see Dr. Janese Banks. Chastity will notify patient and have records faxed.

## 2016-04-20 ENCOUNTER — Encounter: Payer: Self-pay | Admitting: Oncology

## 2016-04-20 ENCOUNTER — Telehealth: Payer: Self-pay | Admitting: *Deleted

## 2016-04-20 ENCOUNTER — Encounter: Payer: Self-pay | Admitting: *Deleted

## 2016-04-20 ENCOUNTER — Other Ambulatory Visit: Payer: Self-pay | Admitting: *Deleted

## 2016-04-20 ENCOUNTER — Inpatient Hospital Stay: Payer: Managed Care, Other (non HMO) | Attending: Oncology | Admitting: Oncology

## 2016-04-20 VITALS — BP 123/77 | HR 72 | Temp 94.7°F | Resp 20 | Ht 64.57 in | Wt 119.6 lb

## 2016-04-20 DIAGNOSIS — Z7982 Long term (current) use of aspirin: Secondary | ICD-10-CM | POA: Diagnosis not present

## 2016-04-20 DIAGNOSIS — I714 Abdominal aortic aneurysm, without rupture: Secondary | ICD-10-CM | POA: Diagnosis not present

## 2016-04-20 DIAGNOSIS — I700 Atherosclerosis of aorta: Secondary | ICD-10-CM | POA: Insufficient documentation

## 2016-04-20 DIAGNOSIS — M353 Polymyalgia rheumatica: Secondary | ICD-10-CM | POA: Insufficient documentation

## 2016-04-20 DIAGNOSIS — R590 Localized enlarged lymph nodes: Secondary | ICD-10-CM | POA: Diagnosis not present

## 2016-04-20 DIAGNOSIS — Z79899 Other long term (current) drug therapy: Secondary | ICD-10-CM | POA: Insufficient documentation

## 2016-04-20 DIAGNOSIS — R938 Abnormal findings on diagnostic imaging of other specified body structures: Secondary | ICD-10-CM | POA: Diagnosis not present

## 2016-04-20 DIAGNOSIS — E785 Hyperlipidemia, unspecified: Secondary | ICD-10-CM | POA: Insufficient documentation

## 2016-04-20 DIAGNOSIS — I2510 Atherosclerotic heart disease of native coronary artery without angina pectoris: Secondary | ICD-10-CM | POA: Diagnosis not present

## 2016-04-20 DIAGNOSIS — J449 Chronic obstructive pulmonary disease, unspecified: Secondary | ICD-10-CM | POA: Insufficient documentation

## 2016-04-20 DIAGNOSIS — F17211 Nicotine dependence, cigarettes, in remission: Secondary | ICD-10-CM | POA: Diagnosis not present

## 2016-04-20 DIAGNOSIS — R9389 Abnormal findings on diagnostic imaging of other specified body structures: Secondary | ICD-10-CM

## 2016-04-20 DIAGNOSIS — K219 Gastro-esophageal reflux disease without esophagitis: Secondary | ICD-10-CM | POA: Diagnosis not present

## 2016-04-20 DIAGNOSIS — I712 Thoracic aortic aneurysm, without rupture: Secondary | ICD-10-CM | POA: Diagnosis not present

## 2016-04-20 DIAGNOSIS — R918 Other nonspecific abnormal finding of lung field: Secondary | ICD-10-CM | POA: Diagnosis not present

## 2016-04-20 DIAGNOSIS — F17210 Nicotine dependence, cigarettes, uncomplicated: Secondary | ICD-10-CM | POA: Diagnosis not present

## 2016-04-20 DIAGNOSIS — I252 Old myocardial infarction: Secondary | ICD-10-CM | POA: Diagnosis not present

## 2016-04-20 DIAGNOSIS — Z955 Presence of coronary angioplasty implant and graft: Secondary | ICD-10-CM | POA: Insufficient documentation

## 2016-04-20 NOTE — Progress Notes (Signed)
  Oncology Nurse Navigator Documentation  Navigator Location: [facility] (04/20/16 1000) Referral date to RadOnc/MedOnc: 04/20/16 (04/20/16 1000) )Navigator Encounter Type: Clinic/MDC (04/20/16 1000)   Abnormal Finding Date: 04/10/16 (04/20/16 1000)           Multidisiplinary Clinic Date: 04/20/16 (04/20/16 1000) Multidisiplinary Clinic Type: Thoracic (04/20/16 1000)     Treatment Phase: Abnormal Scans (04/20/16 1000) Barriers/Navigation Needs: No barriers at this time;No Questions;No Needs (04/20/16 1000)   Interventions: Coordination of Care (04/20/16 1000)   Coordination of Care: Appts;Radiology (04/20/16 1000)        Acuity: Level 2 (04/20/16 1000)   Acuity Level 2: Initial guidance, education and coordination as needed;Educational needs;Assistance expediting appointments (04/20/16 1000)    Met with patient during initial visit with Dr. Janese Banks at thoracic multidisciplinary clinic. Pt and wife did not have any questions or needs at time of visit. Contact information given and encouraged to contact me if have questions after appointment. Radiology appts expedited and reviewed with pt and his wife. Voiced understanding.  Time Spent with Patient: 60 (04/20/16 1000)
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
## 2016-04-20 NOTE — Progress Notes (Signed)
Here for new pt evaluation.  

## 2016-04-20 NOTE — Telephone Encounter (Signed)
Pt given new appt for PET scan on 4/25 at 11:30, arrive 11am. Pt verbalized understanding.

## 2016-04-23 ENCOUNTER — Encounter: Payer: Managed Care, Other (non HMO) | Attending: Internal Medicine | Admitting: *Deleted

## 2016-04-23 VITALS — Ht 64.75 in | Wt 119.9 lb

## 2016-04-23 DIAGNOSIS — I213 ST elevation (STEMI) myocardial infarction of unspecified site: Secondary | ICD-10-CM | POA: Insufficient documentation

## 2016-04-23 DIAGNOSIS — I2111 ST elevation (STEMI) myocardial infarction involving right coronary artery: Secondary | ICD-10-CM

## 2016-04-23 DIAGNOSIS — Z955 Presence of coronary angioplasty implant and graft: Secondary | ICD-10-CM

## 2016-04-23 NOTE — Progress Notes (Signed)
Cardiac Individual Treatment Plan  Patient Details  Name: Clinton Walsh MRN: [id number] Date of Birth: Jul 25, 1960 Referring Provider:     Cardiac Rehab from 04/23/2016 in [facility] Cardiac and Pulmonary Rehab  Referring Provider  End, Harrell Gave MD      Initial Encounter Date:    Cardiac Rehab from 04/23/2016 in [facility] Cardiac and Pulmonary Rehab  Date  04/23/16  Referring Provider  End, Harrell Gave MD      Visit Diagnosis: ST elevation myocardial infarction involving right coronary artery [facility])  Status post coronary artery stent placement  Patient's Home Medications on Admission:  Current Outpatient Prescriptions:  .  aspirin EC 81 MG tablet, Take 1 tablet (81 mg total) by mouth daily., Disp: 30 tablet, Rfl: 0 .  atorvastatin (LIPITOR) 80 MG tablet, Take 1 tablet (80 mg total) by mouth daily at 6 PM., Disp: 30 tablet, Rfl: 0 .  carvedilol (COREG) 3.125 MG tablet, Take 1 tablet (3.125 mg total) by mouth 2 (two) times daily with a meal., Disp: 60 tablet, Rfl: 0 .  lisinopril (PRINIVIL,ZESTRIL) 5 MG tablet, Take 1 tablet (5 mg total) by mouth daily., Disp: 30 tablet, Rfl: 0 .  ticagrelor (BRILINTA) 90 MG TABS tablet, Take 1 tablet (90 mg total) by mouth 2 (two) times daily., Disp: 60 tablet, Rfl: 0  Past Medical History: Past Medical History:  Diagnosis Date  . CAD (coronary artery disease)    a.  2008 Cath:  nonobstructive plaque, NL LV;  b. 03/2011 NSTEMI/Cath:  100% D1, otw nonobs dzs, EF 45-50%.  D1 stented w/  2.25x18 MiniVision BMS. Presented on 04/14/11 with NSTEMI. Cath showed stent thrmobosis likely due to not taking DAPT.  Treated with thrombectomy and angioplasty.   . Hyperlipidemia   . Myocardial infarct [facility])    april 10th/18-had 3 stent put in.( has had 3 MI total )  . Polymyalgia rheumatica (March ARB) 02/2015  . Pulmonary nodules    nonspecific nodules in the right middle lobe  and right lower lobe by CT '08  . Tobacco abuse     Tobacco Use: History  Smoking Status   . Former Smoker  . Packs/day: 0.50  . Years: 30.00  . Types: E-cigarettes, Cigarettes  . Quit date: 07/2015  Smokeless Tobacco  . Never Used    Comment: quit 7/17, uses vap cigarettes now 6 mg nicotine-just dropped to 3 mg " to make heart Dr happy"per pt    Labs: Recent Review Flowsheet Data    Labs for ITP Cardiac and Pulmonary Rehab Latest Ref Rng & Units 10/08/2006 10/09/2006 03/17/2011 04/14/2011 04/11/2016   Cholestrol 0 - 200 mg/dL - 212 ATP III CLASSIFICATION: <200     mg/dL   Desirable 200-239  mg/dL   Borderline High >=240    mg/dL   High(H) 173 192 183   LDLCALC 0 - 99 mg/dL - 146 Total Cholesterol/HDL:CHD Risk Coronary Heart Disease Risk Table Men   Women 1/2 Average Risk   3.4   3.3(H) 118(H) 134(H) 131(H)   HDL >40 mg/dL - 23(L) 38(L) 31(L) 34(L)   Trlycerides <150 mg/dL - 213(H) 83 137 90   Hemoglobin A1c 4.8 - 5.6 % - - 5.6 - 5.7(H)   HCO3 - 21.9 - - - -   TCO2 - 23 - - - -       Exercise Target Goals: Date: 04/23/16  Exercise Program Goal: Individual exercise prescription set with THRR, safety & activity barriers. Participant demonstrates ability to understand and report RPE using  BORG scale, to self-measure pulse accurately, and to acknowledge the importance of the exercise prescription.  Exercise Prescription Goal: Starting with aerobic activity 30 plus minutes a day, 3 days per week for initial exercise prescription. Provide home exercise prescription and guidelines that participant acknowledges understanding prior to discharge.  Activity Barriers & Risk Stratification:     Activity Barriers & Cardiac Risk Stratification - 04/23/16 1312      Activity Barriers & Cardiac Risk Stratification   Activity Barriers Back Problems;Deconditioning;Muscular Weakness;Shortness of Breath;Joint Problems  joints achy, torn meniscus in both knees, crushed vertebrae in 1985   Cardiac Risk Stratification High      6 Minute Walk:     6 Minute Walk    Row Name  04/23/16 1427         6 Minute Walk   Phase Initial     Distance 990 feet     Walk Time 6 minutes     # of Rest Breaks 0     MPH 1.87     METS 3.4     RPE 10     Perceived Dyspnea  1     VO2 Peak 11.9     Symptoms Yes (comment)     Comments SOB     Resting HR 64 bpm     Resting BP 126/72     Max Ex. HR 78 bpm     Max Ex. BP 126/74     2 Minute Post BP 132/66        Oxygen Initial Assessment:   Oxygen Re-Evaluation:   Oxygen Discharge (Final Oxygen Re-Evaluation):   Initial Exercise Prescription:     Initial Exercise Prescription - 04/23/16 1400      Date of Initial Exercise RX and Referring Provider   Date 04/23/16   Referring Provider End, Harrell Gave MD     Treadmill   MPH 1.8   Grade 0   Minutes 15   METs 2.38     Recumbant Bike   Level 1   RPM 50   Watts 49   Minutes 15   METs 2     NuStep   Level 2   SPM 80   Minutes 15   METs 2     Prescription Details   Frequency (times per week) 2   Duration Progress to 45 minutes of aerobic exercise without signs/symptoms of physical distress     Intensity   THRR 40-80% of Max Heartrate 105-146   Ratings of Perceived Exertion 11-13   Perceived Dyspnea 0-4     Progression   Progression Continue to progress workloads to maintain intensity without signs/symptoms of physical distress.     Resistance Training   Training Prescription Yes   Weight 3 lbs   Reps 10-15      Perform Capillary Blood Glucose checks as needed.  Exercise Prescription Changes:      Exercise Prescription Changes    Row Name 04/23/16 1400             Response to Exercise   Blood Pressure (Admit) 126/72       Blood Pressure (Exercise) 126/74       Blood Pressure (Exit) 132/66       Heart Rate (Admit) 64 bpm       Heart Rate (Exercise) 78 bpm       Heart Rate (Exit) 74 bpm       Oxygen Saturation (Admit) 99 %       Oxygen  Saturation (Exercise) 91 %       Oxygen Saturation (Exit) 96 %       Rating of Perceived  Exertion (Exercise) 10       Perceived Dyspnea (Exercise) 1       Symptoms SOB       Comments walk test results          Exercise Comments:   Exercise Goals and Review:      Exercise Goals    Row Name 04/23/16 1431             Exercise Goals   Increase Physical Activity Yes       Intervention Provide advice, education, support and counseling about physical activity/exercise needs.;Develop an individualized exercise prescription for aerobic and resistive training based on initial evaluation findings, risk stratification, comorbidities and participant's personal goals.       Expected Outcomes Achievement of increased cardiorespiratory fitness and enhanced flexibility, muscular endurance and strength shown through measurements of functional capacity and personal statement of participant.       Increase Strength and Stamina Yes       Intervention Provide advice, education, support and counseling about physical activity/exercise needs.;Develop an individualized exercise prescription for aerobic and resistive training based on initial evaluation findings, risk stratification, comorbidities and participant's personal goals.       Expected Outcomes Achievement of increased cardiorespiratory fitness and enhanced flexibility, muscular endurance and strength shown through measurements of functional capacity and personal statement of participant.          Exercise Goals Re-Evaluation :   Discharge Exercise Prescription (Final Exercise Prescription Changes):     Exercise Prescription Changes - 04/23/16 1400      Response to Exercise   Blood Pressure (Admit) 126/72   Blood Pressure (Exercise) 126/74   Blood Pressure (Exit) 132/66   Heart Rate (Admit) 64 bpm   Heart Rate (Exercise) 78 bpm   Heart Rate (Exit) 74 bpm   Oxygen Saturation (Admit) 99 %   Oxygen Saturation (Exercise) 91 %   Oxygen Saturation (Exit) 96 %   Rating of Perceived Exertion (Exercise) 10   Perceived Dyspnea  (Exercise) 1   Symptoms SOB   Comments walk test results      Nutrition:  Target Goals: Understanding of nutrition guidelines, daily intake of sodium '1500mg'$ , cholesterol '200mg'$ , calories 30% from fat and 7% or less from saturated fats, daily to have 5 or more servings of fruits and vegetables.  Biometrics:     Pre Biometrics - 04/23/16 1431      Pre Biometrics   Height 5' 4.75" (1.645 m)   Weight 119 lb 14.4 oz (54.4 kg)   Waist Circumference 31.5 inches   Hip Circumference 33 inches   Waist to Hip Ratio 0.95 %   BMI (Calculated) 20.1   Single Leg Stand 30 seconds       Nutrition Therapy Plan and Nutrition Goals:     Nutrition Therapy & Goals - 04/23/16 1238      Nutrition Therapy   RD appointment defered Yes   Drug/Food Interactions Statins/Certain Fruits      Nutrition Discharge: Rate Your Plate Scores:     Nutrition Assessments - 04/23/16 1243      MEDFICTS Scores   Pre Score 88      Nutrition Goals Re-Evaluation:   Nutrition Goals Discharge (Final Nutrition Goals Re-Evaluation):   Psychosocial: Target Goals: Acknowledge presence or absence of significant depression and/or stress, maximize coping skills, provide positive support  system. Participant is able to verbalize types and ability to use techniques and skills needed for reducing stress and depression.   Initial Review & Psychosocial Screening:     Initial Psych Review & Screening - 04/23/16 1239      Initial Review   Current issues with Current Stress Concerns   Source of Stress Concerns Occupation   Comments Kiaan is worried that his test this week may show lung cancer. He has smoked for years he said.      Barriers   Psychosocial barriers to participate in program The patient should benefit from training in stress management and relaxation.     Screening Interventions   Interventions Encouraged to exercise;To provide support and resources with identified psychosocial needs;Program  counselor consult      Quality of Life Scores:      Quality of Life - 04/23/16 1240      Quality of Life Scores   Health/Function Pre 16.93 %   Socioeconomic Pre 15.06 %   Psych/Spiritual Pre 24 %   Family Pre 26.4 %   GLOBAL Pre 18.98 %      PHQ-9: Recent Review Flowsheet Data    Depression screen [facility] 2/9 04/23/2016   Decreased Interest 0   Down, Depressed, Hopeless 1   PHQ - 2 Score 1   Altered sleeping 0   Tired, decreased energy 1   Change in appetite 1   Feeling bad or failure about yourself  0   Trouble concentrating 0   Moving slowly or fidgety/restless 0   Suicidal thoughts 0   PHQ-9 Score 3   Difficult doing work/chores Somewhat difficult     Interpretation of Total Score  Total Score Depression Severity:  1-4 = Minimal depression, 5-9 = Mild depression, 10-14 = Moderate depression, 15-19 = Moderately severe depression, 20-27 = Severe depression   Psychosocial Evaluation and Intervention:   Psychosocial Re-Evaluation:   Psychosocial Discharge (Final Psychosocial Re-Evaluation):   Vocational Rehabilitation: Provide vocational rehab assistance to qualifying candidates.   Vocational Rehab Evaluation & Intervention:     Vocational Rehab - 04/23/16 1236      Initial Vocational Rehab Evaluation & Intervention   Assessment shows need for Vocational Rehabilitation No      Education: Education Goals: Education classes will be provided on a weekly basis, covering required topics. Participant will state understanding/return demonstration of topics presented.  Learning Barriers/Preferences:     Learning Barriers/Preferences - 04/23/16 1234      Learning Barriers/Preferences   Learning Barriers None   Learning Preferences None      Education Topics: General Nutrition Guidelines/Fats and Fiber: -Group instruction provided by verbal, written material, models and posters to present the general guidelines for heart healthy nutrition. Gives an  explanation and review of dietary fats and fiber.   Controlling Sodium/Reading Food Labels: -Group verbal and written material supporting the discussion of sodium use in heart healthy nutrition. Review and explanation with models, verbal and written materials for utilization of the food label.   Exercise Physiology & Risk Factors: - Group verbal and written instruction with models to review the exercise physiology of the cardiovascular system and associated critical values. Details cardiovascular disease risk factors and the goals associated with each risk factor.   Aerobic Exercise & Resistance Training: - Gives group verbal and written discussion on the health impact of inactivity. On the components of aerobic and resistive training programs and the benefits of this training and how to safely progress through these programs.  Flexibility, Balance, General Exercise Guidelines: - Provides group verbal and written instruction on the benefits of flexibility and balance training programs. Provides general exercise guidelines with specific guidelines to those with heart or lung disease. Demonstration and skill practice provided.   Stress Management: - Provides group verbal and written instruction about the health risks of elevated stress, cause of high stress, and healthy ways to reduce stress.   Depression: - Provides group verbal and written instruction on the correlation between heart/lung disease and depressed mood, treatment options, and the stigmas associated with seeking treatment.   Anatomy & Physiology of the Heart: - Group verbal and written instruction and models provide basic cardiac anatomy and physiology, with the coronary electrical and arterial systems. Review of: AMI, Angina, Valve disease, Heart Failure, Cardiac Arrhythmia, Pacemakers, and the ICD.   Cardiac Procedures: - Group verbal and written instruction and models to describe the testing methods done to diagnose  heart disease. Reviews the outcomes of the test results. Describes the treatment choices: Medical Management, Angioplasty, or Coronary Bypass Surgery.   Cardiac Medications: - Group verbal and written instruction to review commonly prescribed medications for heart disease. Reviews the medication, class of the drug, and side effects. Includes the steps to properly store meds and maintain the prescription regimen.   Go Sex-Intimacy & Heart Disease, Get SMART - Goal Setting: - Group verbal and written instruction through game format to discuss heart disease and the return to sexual intimacy. Provides group verbal and written material to discuss and apply goal setting through the application of the S.M.A.R.T. Method.   Other Matters of the Heart: - Provides group verbal, written materials and models to describe Heart Failure, Angina, Valve Disease, and Diabetes in the realm of heart disease. Includes description of the disease process and treatment options available to the cardiac patient.   Exercise & Equipment Safety: - Individual verbal instruction and demonstration of equipment use and safety with use of the equipment.   Cardiac Rehab from 04/23/2016 in [facility] Cardiac and Pulmonary Rehab  Date  04/23/16  Educator  C. Jaysha Lasure, RN  Instruction Review Code  1- partially meets, needs review/practice      Infection Prevention: - Provides verbal and written material to individual with discussion of infection control including proper hand washing and proper equipment cleaning during exercise session.   Cardiac Rehab from 04/23/2016 in [facility] Cardiac and Pulmonary Rehab  Date  04/23/16  Educator  C. EnterkinRN  Instruction Review Code  2- meets goals/outcomes      Falls Prevention: - Provides verbal and written material to individual with discussion of falls prevention and safety.   Cardiac Rehab from 04/23/2016 in [facility] Cardiac and Pulmonary Rehab  Date  04/23/16  Educator  C. Christmas   Instruction Review Code  2- meets goals/outcomes      Diabetes: - Individual verbal and written instruction to review signs/symptoms of diabetes, desired ranges of glucose level fasting, after meals and with exercise. Advice that pre and post exercise glucose checks will be done for 3 sessions at entry of program.    Knowledge Questionnaire Score:     Knowledge Questionnaire Score - 04/23/16 1235      Knowledge Questionnaire Score   Pre Score 16/28      Core Components/Risk Factors/Patient Goals at Admission:     Personal Goals and Risk Factors at Admission - 04/23/16 1239      Core Components/Risk Factors/Patient Goals on Admission    Weight Management Yes;Weight Maintenance  Intervention Weight Management: Develop a combined nutrition and exercise program designed to reach desired caloric intake, while maintaining appropriate intake of nutrient and fiber, sodium and fats, and appropriate energy expenditure required for the weight goal.;Weight Management: Provide education and appropriate resources to help participant work on and attain dietary goals.   Admit Weight 119 lb 14.4 oz (54.4 kg)   Expected Outcomes Short Term: Continue to assess and modify interventions until short term weight is achieved;Long Term: Adherence to nutrition and physical activity/exercise program aimed toward attainment of established weight goal;Weight Maintenance: Understanding of the daily nutrition guidelines, which includes 25-35% calories from fat, 7% or less cal from saturated fats, less than '200mg'$  cholesterol, less than 1.5gm of sodium, & 5 or more servings of fruits and vegetables daily   Tobacco Cessation Yes   Intervention Assist the participant in steps to quit. Provide individualized education and counseling about committing to Tobacco Cessation, relapse prevention, and pharmacological support that can be provided by physician.;Advice worker, assist with locating and accessing  local/national Quit Smoking programs, and support quit date choice.   Expected Outcomes Short Term: Will demonstrate readiness to quit, by selecting a quit date.;Long Term: Complete abstinence from all tobacco products for at least 12 months from quit date.   Heart Failure --   Intervention --   Expected Outcomes Improve functional capacity of life;Short term: Attendance in program 2-3 days a week with increased exercise capacity. Reported lower sodium intake. Reported increased fruit and vegetable intake. Reports medication compliance.   Lipids Yes   Intervention Provide education and support for participant on nutrition & aerobic/resistive exercise along with prescribed medications to achieve LDL '70mg'$ , HDL >'40mg'$ .   Expected Outcomes Short Term: Participant states understanding of desired cholesterol values and is compliant with medications prescribed. Participant is following exercise prescription and nutrition guidelines.;Long Term: Cholesterol controlled with medications as prescribed, with individualized exercise RX and with personalized nutrition plan. Value goals: LDL < '70mg'$ , HDL > 40 mg.      Core Components/Risk Factors/Patient Goals Review:    Core Components/Risk Factors/Patient Goals at Discharge (Final Review):    ITP Comments:     ITP Comments    Row Name 04/23/16 1237 04/23/16 1245         ITP Comments ITP Created during Medical Review. NSTEMI diagnosis4/10/2016 [facility] note Heith is scheduled for a head to toe PET Scan on 04/25/2016 after his Chest xray and Chest CAT SCAN revealed a lung mass.          Comments: Raedyn is not sure that he will be able to attend many Cardiac Rehab sessions since he has a test he is worried about this week "which will probably show that I have lung cancer since I have smoked for years.".

## 2016-04-23 NOTE — Patient Instructions (Signed)
Patient Instructions  Patient Details  Name: Clinton Walsh MRN: [id number] Date of Birth: 24-Mar-1960 Referring Provider:  [name], MD  Below are the personal goals you chose as well as exercise and nutrition goals. Our goal is to help you keep on track towards obtaining and maintaining your goals. We will be discussing your progress on these goals with you throughout the program.  Initial Exercise Prescription:     Initial Exercise Prescription - 04/23/16 1400      Date of Initial Exercise RX and Referring Provider   Date 04/23/16   Referring Provider End, Harrell Gave MD     Treadmill   MPH 1.8   Grade 0   Minutes 15   METs 2.38     Recumbant Bike   Level 1   RPM 50   Watts 49   Minutes 15   METs 2     NuStep   Level 2   SPM 80   Minutes 15   METs 2     Prescription Details   Frequency (times per week) 2   Duration Progress to 45 minutes of aerobic exercise without signs/symptoms of physical distress     Intensity   THRR 40-80% of Max Heartrate 105-146   Ratings of Perceived Exertion 11-13   Perceived Dyspnea 0-4     Progression   Progression Continue to progress workloads to maintain intensity without signs/symptoms of physical distress.     Resistance Training   Training Prescription Yes   Weight 3 lbs   Reps 10-15      Exercise Goals: Frequency: Be able to perform aerobic exercise three times per week working toward 3-5 days per week.  Intensity: Work with a perceived exertion of 11 (fairly light) - 15 (hard) as tolerated. Follow your new exercise prescription and watch for changes in prescription as you progress with the program. Changes will be reviewed with you when they are made.  Duration: You should be able to do 30 minutes of continuous aerobic exercise in addition to a 5 minute warm-up and a 5 minute cool-down routine.  Nutrition Goals: Your personal nutrition goals will be established when you do your nutrition analysis with  the dietician.  The following are nutrition guidelines to follow: Cholesterol < '200mg'$ /day Sodium < '1500mg'$ /day Fiber: Men over 50 yrs - 30 grams per day  Personal Goals:     Personal Goals and Risk Factors at Admission - 04/23/16 1239      Core Components/Risk Factors/Patient Goals on Admission    Weight Management Yes;Weight Maintenance   Intervention Weight Management: Develop a combined nutrition and exercise program designed to reach desired caloric intake, while maintaining appropriate intake of nutrient and fiber, sodium and fats, and appropriate energy expenditure required for the weight goal.;Weight Management: Provide education and appropriate resources to help participant work on and attain dietary goals.   Admit Weight 119 lb 14.4 oz (54.4 kg)   Expected Outcomes Short Term: Continue to assess and modify interventions until short term weight is achieved;Long Term: Adherence to nutrition and physical activity/exercise program aimed toward attainment of established weight goal;Weight Maintenance: Understanding of the daily nutrition guidelines, which includes 25-35% calories from fat, 7% or less cal from saturated fats, less than '200mg'$  cholesterol, less than 1.5gm of sodium, & 5 or more servings of fruits and vegetables daily   Tobacco Cessation Yes   Intervention Assist the participant in steps to quit. Provide individualized education and counseling about committing to Tobacco Cessation, relapse prevention,  and pharmacological support that can be provided by physician.;Advice worker, assist with locating and accessing local/national Quit Smoking programs, and support quit date choice.   Expected Outcomes Short Term: Will demonstrate readiness to quit, by selecting a quit date.;Long Term: Complete abstinence from all tobacco products for at least 12 months from quit date.   Heart Failure --   Intervention --   Expected Outcomes Improve functional capacity of life;Short  term: Attendance in program 2-3 days a week with increased exercise capacity. Reported lower sodium intake. Reported increased fruit and vegetable intake. Reports medication compliance.   Lipids Yes   Intervention Provide education and support for participant on nutrition & aerobic/resistive exercise along with prescribed medications to achieve LDL '70mg'$ , HDL >'40mg'$ .   Expected Outcomes Short Term: Participant states understanding of desired cholesterol values and is compliant with medications prescribed. Participant is following exercise prescription and nutrition guidelines.;Long Term: Cholesterol controlled with medications as prescribed, with individualized exercise RX and with personalized nutrition plan. Value goals: LDL < '70mg'$ , HDL > 40 mg.      Tobacco Use Initial Evaluation: History  Smoking Status  . Former Smoker  . Packs/day: 0.50  . Years: 30.00  . Types: E-cigarettes, Cigarettes  . Quit date: 07/2015  Smokeless Tobacco  . Never Used    Comment: quit 7/17, uses vap cigarettes now 6 mg nicotine-just dropped to 3 mg " to make heart Dr happy"per pt    Copy of goals given to participant.

## 2016-04-23 NOTE — Progress Notes (Signed)
Daily Session Note  Patient Details  Name: KAHIL AGNER MRN: [id number] Date of Birth: 06/16/1960 Referring Provider:    Encounter Date: 04/23/2016  Check In:     Session Check In - 04/23/16 1236      Check-In   Location [facility]   Staff Present Gerlene Burdock, RN, BSN;Susanne Bice, RN, BSN, CCRP;Jessica Luan Pulling, MA, ACSM RCEP, Exercise Physiologist   Supervising physician immediately available to respond to emergencies See telemetry face sheet for immediately available ER MD   Medication changes reported     No   Fall or balance concerns reported    No   Tobacco Cessation No Change   Warm-up and Cool-down Performed as group-led instruction   Resistance Training Performed Yes   VAD Patient? No     Pain Assessment   Currently in Pain? No/denies         History  Smoking Status  . Former Smoker  . Packs/day: 0.50  . Years: 30.00  . Types: E-cigarettes, Cigarettes  . Quit date: 07/2015  Smokeless Tobacco  . Never Used    Comment: quit 7/17, uses vap cigarettes now 6 mg nicotine-just dropped to 3 mg " to make heart Dr happy"per pt    Goals Met:  Proper associated with RPD/PD & O2 Sat Personal goals reviewed No report of cardiac concerns or symptoms  Goals Unmet:  Not Applicable  Comments:     Dr. Emily Filbert is Medical Director for [facility].

## 2016-04-24 ENCOUNTER — Ambulatory Visit: Payer: Managed Care, Other (non HMO)

## 2016-04-24 ENCOUNTER — Ambulatory Visit (INDEPENDENT_AMBULATORY_CARE_PROVIDER_SITE_OTHER): Payer: Managed Care, Other (non HMO) | Admitting: Internal Medicine

## 2016-04-24 ENCOUNTER — Encounter: Payer: Self-pay | Admitting: Internal Medicine

## 2016-04-24 VITALS — BP 102/78 | HR 62 | Ht 65.0 in | Wt 120.0 lb

## 2016-04-24 DIAGNOSIS — I2510 Atherosclerotic heart disease of native coronary artery without angina pectoris: Secondary | ICD-10-CM

## 2016-04-24 DIAGNOSIS — I255 Ischemic cardiomyopathy: Secondary | ICD-10-CM | POA: Diagnosis not present

## 2016-04-24 DIAGNOSIS — R918 Other nonspecific abnormal finding of lung field: Secondary | ICD-10-CM

## 2016-04-24 DIAGNOSIS — R0789 Other chest pain: Secondary | ICD-10-CM | POA: Diagnosis not present

## 2016-04-24 MED ORDER — LISINOPRIL 5 MG PO TABS: 5.0000 mg | ORAL_TABLET | Freq: Every day | ORAL | 3 refills | Status: DC

## 2016-04-24 MED ORDER — GLYCOPYRROLATE 0.2 MG/ML IJ SOLN
INTRAMUSCULAR | Status: AC
  Filled 2016-04-24: qty 1

## 2016-04-24 MED ORDER — LIDOCAINE HCL (PF) 2 % IJ SOLN
INTRAMUSCULAR | Status: AC
  Filled 2016-04-24: qty 2

## 2016-04-24 MED ORDER — ATORVASTATIN CALCIUM 80 MG PO TABS: 80.0000 mg | ORAL_TABLET | Freq: Every day | ORAL | 3 refills | Status: DC

## 2016-04-24 MED ORDER — TICAGRELOR 90 MG PO TABS: 90.0000 mg | ORAL_TABLET | Freq: Two times a day (BID) | ORAL | 3 refills | Status: DC

## 2016-04-24 MED ORDER — PROPOFOL 500 MG/50ML IV EMUL
INTRAVENOUS | Status: AC
  Filled 2016-04-24: qty 50

## 2016-04-24 MED ORDER — FENTANYL CITRATE (PF) 100 MCG/2ML IJ SOLN
INTRAMUSCULAR | Status: AC
  Filled 2016-04-24: qty 2

## 2016-04-24 MED ORDER — MIDAZOLAM HCL 2 MG/2ML IJ SOLN
INTRAMUSCULAR | Status: AC
  Filled 2016-04-24: qty 2

## 2016-04-24 MED ORDER — CARVEDILOL 3.125 MG PO TABS: 3.1250 mg | ORAL_TABLET | Freq: Two times a day (BID) | ORAL | 3 refills | Status: DC

## 2016-04-24 NOTE — Patient Instructions (Signed)
Medication Instructions:  Your physician recommends that you continue on your current medications as directed. Please refer to the Current Medication list given to you today.   Labwork: none  Testing/Procedures: none  Follow-Up: Your physician recommends that you schedule a follow-up appointment in: 6 weeks with Dr. Saunders Revel.    Any Other Special Instructions Will Be Listed Below (If Applicable).     If you need a refill on your cardiac medications before your next appointment, please call your pharmacy.

## 2016-04-24 NOTE — Progress Notes (Signed)
Follow-up Outpatient Visit Date: 04/24/2016  Primary Care Provider: Halina Maidens, MD [address] [place] [address]  Chief Complaint: Follow-up coronary artery disease  HPI:  Mr. Gallien is a 56 y.o. year-old male with history of coronary artery disease status post PCI to diagonal in 7342 complicated by acute stent thrombosis and recent high risk NSTEMI with subtotal thrombotic occlusion of the proximal/mid RCA, who presents for follow-up of coronary artery disease. He presented with acute right-sided chest pain radiating to the back on 04/10/16. CT of the chest was negative for aortic dissection. EKG changes showed borderline inferior ST segment elevation; emergent left heart catheterization was therefore performed for high-risk NSTEMI. Nonocclusive thrombus with diffuse plaquing was noted in the proximal and mid RCA, likely representing acute plaque rupture and thrombotic occlusion of the vessel with spontaneous recanalization. This was successfully treated with a single drug-eluting stent, resulting in resolution of the patient's chest pain.  Since leaving the hospital, Mr. Ivins has felt relatively well. He notes some fatigue and exertional shortness of breath, which predates his recent MI. He has noticed a few brief "electrical shocks" across his chest and left upper abdomen. These typically happen when he is sitting still and last for up to 30 seconds. There are no clear precipitants. The discomfort is different than what he experienced at the time of his acute MIs. He denies palpitations, lightheadedness, orthopnea, and PND. He remains compliant with his medications, including dual antiplatelet therapy with aspirin and ticagrelor.  During his hospitalization earlier this month, Mr. Bostic was also noted to have a pulmonary mass and mediastinal lymphadenopathy concerning for malignancy. He is undergoing evaluation at the [facility], with PET scan currently  pending.Marland Kitchen  --------------------------------------------------------------------------------------------------  Cardiovascular History & Procedures: Cardiovascular Problems:  Coronary artery disease  Ischemic cardiomyopathy  Risk Factors:  Known coronary artery disease, male gender, and history of tobacco use  Cath/PCI:  LHC/PCI (04/10/16): LMCA normal. LAD with 30% mid vessel disease. D1 with 30% ostial stenosis and patent proximal stent with 20% in-stent restenosis. LCx with 50% proximal/mid disease as well as 40% proximal OM 2 stenosis. Large, dominant RCA with 30% ostial and 90% proximal/mid stenosis with intraluminal thrombus. Successful IVUS-guided PCI to the proximal/mid RCA with placement of a Resolute Integrity 3.0 x 34 mm drug-eluting stent. LVEDP 13 mmHg  CV Surgery:  None  EP Procedures and Devices:  None  Non-Invasive Evaluation(s):  TTE (04/10/16): Normal LV size with LVEF of 30-35% and hypokinesis of the inferior and inferolateral myocardium. Grade 1 diastolic dysfunction. Trivial aortic regurgitation. Mild to moderate mitral regurgitation. Mildly reduced right ventricular contraction. Normal pulmonary artery pressure.  Recent CV Pertinent Labs: Lab Results  Component Value Date   CHOL 183 04/11/2016   CHOL 192 04/14/2011   HDL 34 (L) 04/11/2016   HDL 31 (L) 04/14/2011   LDLCALC 131 (H) 04/11/2016   LDLCALC 134 (H) 04/14/2011   TRIG 90 04/11/2016   TRIG 137 04/14/2011   CHOLHDL 5.4 04/11/2016   INR 0.96 04/10/2016   K 3.8 04/11/2016   K 3.9 09/19/2011   MG 1.8 04/15/2011   BUN 18 04/11/2016   BUN 14 08/29/2015   BUN 14 09/19/2011   CREATININE 0.93 04/11/2016   CREATININE 0.97 09/19/2011    Past medical and surgical history were reviewed and updated in EPIC.  Outpatient Encounter Prescriptions as of 04/24/2016  Medication Sig  . aspirin EC 81 MG tablet Take 1 tablet (81 mg total) by mouth daily.  Marland Kitchen  atorvastatin (LIPITOR) 80 MG tablet Take 1 tablet  (80 mg total) by mouth daily at 6 PM.  . carvedilol (COREG) 3.125 MG tablet Take 1 tablet (3.125 mg total) by mouth 2 (two) times daily with a meal.  . lisinopril (PRINIVIL,ZESTRIL) 5 MG tablet Take 1 tablet (5 mg total) by mouth daily.  . ticagrelor (BRILINTA) 90 MG TABS tablet Take 1 tablet (90 mg total) by mouth 2 (two) times daily.  . [DISCONTINUED] atorvastatin (LIPITOR) 80 MG tablet Take 1 tablet (80 mg total) by mouth daily at 6 PM.  . [DISCONTINUED] carvedilol (COREG) 3.125 MG tablet Take 1 tablet (3.125 mg total) by mouth 2 (two) times daily with a meal.  . [DISCONTINUED] lisinopril (PRINIVIL,ZESTRIL) 5 MG tablet Take 1 tablet (5 mg total) by mouth daily.  . [DISCONTINUED] ticagrelor (BRILINTA) 90 MG TABS tablet Take 1 tablet (90 mg total) by mouth 2 (two) times daily.   No facility-administered encounter medications on file as of 04/24/2016.     Allergies: Sulfa antibiotics  Social History   Social History  . Marital status: Married    Spouse name: N/A  . Number of children: N/A  . Years of education: N/A   Occupational History  . 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 Smoking status: Former Smoker    Packs/day: 0.50    Years: 30.00    Types: E-cigarettes, Cigarettes    Quit date: 07/2015  . Smokeless tobacco: Never Used     Comment: quit 7/17, uses vap cigarettes now 6 mg nicotine-just dropped to 3 mg " to make heart Dr happy"per pt  . Alcohol use No     Comment: none x 10y  . Drug use: No  . Sexual activity: Not Currently   Other Topics Concern  . Not on file   Social History Narrative   Lives in [place] with his wife    Family History  Problem Relation Age of Onset  . Other Father     "Heart problems" pacemaker  . Prostate cancer Father   . Heart attack Father   . Other Mother     "heart problems"  . Heart attack Mother   . Other Maternal Grandfather     "heart exploded" in his 35s     Review of Systems: A 12-system review of systems was performed and was negative except as noted in the HPI.  --------------------------------------------------------------------------------------------------  Physical Exam: BP 102/78 (BP Location: Right Arm, Patient Position: Sitting, Cuff Size: Normal)   Pulse 62   Ht '5\' 5"'$  (1.651 m)   Wt 120 lb (54.4 kg)   BMI 19.97 kg/m   General:  Thin man, appearing older than his stated age, seated comfortably in the exam room. HEENT: No conjunctival pallor or scleral icterus.  Moist mucous membranes.  OP clear. Neck: Supple without lymphadenopathy, thyromegaly, JVD, or HJR. Lungs: Normal work of breathing.  Mildly diminished breath sounds throughout. No wheezes or crackles. Heart: Regular rate and rhythm without murmurs, rubs, or gallops.  Non-displaced PMI. Abd: Bowel sounds present.  Soft, NT/ND without hepatosplenomegaly Ext: No lower extremity edema.  Radial, PT, and DP pulses are 2+ bilaterally. Right radial arteriotomy site well healed. Skin: warm and dry without rash  EKG:  Normal sinus rhythm with left posterior fascicular block and inferior T-wave inversions. T-wave flattening now evident in V5 and V6. Otherwise, there has been no significant change from  prior tracing on 04/11/16 (I have personally reviewed both tracings).  Lab Results  Component Value Date   WBC 4.6 04/11/2016   HGB 13.7 04/11/2016   HCT 40.2 04/11/2016   MCV 89.7 04/11/2016   PLT 224 04/11/2016    Lab Results  Component Value Date   NA 138 04/11/2016   K 3.8 04/11/2016   CL 109 04/11/2016   CO2 24 04/11/2016   BUN 18 04/11/2016   CREATININE 0.93 04/11/2016   GLUCOSE 88 04/11/2016   ALT 14 (L) 04/10/2016    Lab Results  Component Value Date   CHOL 183 04/11/2016   HDL 34 (L) 04/11/2016   LDLCALC 131 (H) 04/11/2016   TRIG 90 04/11/2016   CHOLHDL 5.4 04/11/2016    --------------------------------------------------------------------------------------------------  ASSESSMENT AND PLAN: Coronary artery disease with atypical chest pain Patient has recovered relatively well from his recent high-risk NSTEMI. His episodes of "electric shock" like chest pain are unlikely to be related to worsening coronary insufficiency, given the pain's quality, brief duration, and nonexertional nature. We will continue his current medication regimen, including dual antiplatelet therapy with aspirin and ticagrelor, as well as high-intensity atorvastatin. I have encouraged the patient to proceed with cardiac rehabilitation. I think it is reasonable for him to return to work, though he should limit his activity to begin with and increase it gradually as tolerated.  Ischemic cardiomyopathy Post MI, Mr. Ehresman was noted to have moderately to severely reduced LV contraction with an EF of 30-35%. He appears euvolemic and well compensated on exam today. Some of his exertional dyspnea and fatigue may be related to heart failure (NYHA class 2). We will continue current doses of carvedilol and lisinopril. His borderline low heart rate and blood pressure preclude further up titration. We will plan to repeat an echocardiogram 3 months after his acute MI to reevaluate LVEF.  Lung mass and mediastinal lymphadenopathy Patient is currently undergoing evaluation in the [place].  Follow-up: Return to clinic in 6 weeks.  [name], MD 04/25/2016 7:54 AM

## 2016-04-25 ENCOUNTER — Encounter: Payer: Self-pay | Admitting: *Deleted

## 2016-04-25 ENCOUNTER — Encounter: Payer: Self-pay | Admitting: Internal Medicine

## 2016-04-25 ENCOUNTER — Ambulatory Visit: Payer: Managed Care, Other (non HMO)

## 2016-04-25 DIAGNOSIS — I2111 ST elevation (STEMI) myocardial infarction involving right coronary artery: Secondary | ICD-10-CM

## 2016-04-25 DIAGNOSIS — Z955 Presence of coronary angioplasty implant and graft: Secondary | ICD-10-CM

## 2016-04-25 DIAGNOSIS — I255 Ischemic cardiomyopathy: Secondary | ICD-10-CM | POA: Insufficient documentation

## 2016-04-25 DIAGNOSIS — R918 Other nonspecific abnormal finding of lung field: Secondary | ICD-10-CM | POA: Insufficient documentation

## 2016-04-25 NOTE — Progress Notes (Signed)
Cardiac Individual Treatment Plan  Patient Details  Name: Clinton Walsh MRN: [id number] Date of Birth: 20-Apr-1960 Referring Provider:     Cardiac Rehab from 04/23/2016 in [facility] Cardiac and Pulmonary Rehab  Referring Provider  End, Harrell Gave MD      Initial Encounter Date:    Cardiac Rehab from 04/23/2016 in [facility] Cardiac and Pulmonary Rehab  Date  04/23/16  Referring Provider  End, Harrell Gave MD      Visit Diagnosis: ST elevation myocardial infarction involving right coronary artery [facility])  Status post coronary artery stent placement  Patient's Home Medications on Admission:  Current Outpatient Prescriptions:  .  aspirin EC 81 MG tablet, Take 1 tablet (81 mg total) by mouth daily., Disp: 30 tablet, Rfl: 0 .  atorvastatin (LIPITOR) 80 MG tablet, Take 1 tablet (80 mg total) by mouth daily at 6 PM., Disp: 90 tablet, Rfl: 3 .  carvedilol (COREG) 3.125 MG tablet, Take 1 tablet (3.125 mg total) by mouth 2 (two) times daily with a meal., Disp: 180 tablet, Rfl: 3 .  lisinopril (PRINIVIL,ZESTRIL) 5 MG tablet, Take 1 tablet (5 mg total) by mouth daily., Disp: 90 tablet, Rfl: 3 .  ticagrelor (BRILINTA) 90 MG TABS tablet, Take 1 tablet (90 mg total) by mouth 2 (two) times daily., Disp: 180 tablet, Rfl: 3  Past Medical History: Past Medical History:  Diagnosis Date  . CAD (coronary artery disease)    a.  2008 Cath:  nonobstructive plaque, NL LV;  b. 03/2011 NSTEMI/Cath:  100% D1, otw nonobs dzs, EF 45-50%.  D1 stented w/  2.25x18 MiniVision BMS. Presented on 04/14/11 with NSTEMI. Cath showed stent thrmobosis likely due to not taking DAPT.  Treated with thrombectomy and angioplasty.   . Hyperlipidemia   . Myocardial infarct [facility])    april 10th/18-had 3 stent put in.( has had 3 MI total )  . Polymyalgia rheumatica ([place]) 02/2015  . Pulmonary nodules    nonspecific nodules in the right middle lobe  and right lower lobe by CT '08  . Tobacco abuse     Tobacco Use: History  Smoking  Status  . Former Smoker  . Packs/day: 0.50  . Years: 30.00  . Types: E-cigarettes, Cigarettes  . Quit date: 07/2015  Smokeless Tobacco  . Never Used    Comment: quit 7/17, uses vap cigarettes now 6 mg nicotine-just dropped to 3 mg " to make heart Dr happy"per pt    Labs: Recent Review Flowsheet Data    Labs for ITP Cardiac and Pulmonary Rehab Latest Ref Rng & Units 10/08/2006 10/09/2006 03/17/2011 04/14/2011 04/11/2016   Cholestrol 0 - 200 mg/dL - 212 ATP III CLASSIFICATION: <200     mg/dL   Desirable 200-239  mg/dL   Borderline High >=240    mg/dL   High(H) 173 192 183   LDLCALC 0 - 99 mg/dL - 146 Total Cholesterol/HDL:CHD Risk Coronary Heart Disease Risk Table Men   Women 1/2 Average Risk   3.4   3.3(H) 118(H) 134(H) 131(H)   HDL >40 mg/dL - 23(L) 38(L) 31(L) 34(L)   Trlycerides <150 mg/dL - 213(H) 83 137 90   Hemoglobin A1c 4.8 - 5.6 % - - 5.6 - 5.7(H)   HCO3 - 21.9 - - - -   TCO2 - 23 - - - -       Exercise Target Goals:    Exercise Program Goal: Individual exercise prescription set with THRR, safety & activity barriers. Participant demonstrates ability to understand and report RPE using  BORG scale, to self-measure pulse accurately, and to acknowledge the importance of the exercise prescription.  Exercise Prescription Goal: Starting with aerobic activity 30 plus minutes a day, 3 days per week for initial exercise prescription. Provide home exercise prescription and guidelines that participant acknowledges understanding prior to discharge.  Activity Barriers & Risk Stratification:     Activity Barriers & Cardiac Risk Stratification - 04/23/16 1312      Activity Barriers & Cardiac Risk Stratification   Activity Barriers Back Problems;Deconditioning;Muscular Weakness;Shortness of Breath;Joint Problems  joints achy, torn meniscus in both knees, crushed vertebrae in 1985   Cardiac Risk Stratification High      6 Minute Walk:     6 Minute Walk    Row Name 04/23/16  1427         6 Minute Walk   Phase Initial     Distance 990 feet     Walk Time 6 minutes     # of Rest Breaks 0     MPH 1.87     METS 3.4     RPE 10     Perceived Dyspnea  1     VO2 Peak 11.9     Symptoms Yes (comment)     Comments SOB     Resting HR 64 bpm     Resting BP 126/72     Max Ex. HR 78 bpm     Max Ex. BP 126/74     2 Minute Post BP 132/66        Oxygen Initial Assessment:   Oxygen Re-Evaluation:   Oxygen Discharge (Final Oxygen Re-Evaluation):   Initial Exercise Prescription:     Initial Exercise Prescription - 04/23/16 1400      Date of Initial Exercise RX and Referring Provider   Date 04/23/16   Referring Provider End, Harrell Gave MD     Treadmill   MPH 1.8   Grade 0   Minutes 15   METs 2.38     Recumbant Bike   Level 1   RPM 50   Watts 49   Minutes 15   METs 2     NuStep   Level 2   SPM 80   Minutes 15   METs 2     Prescription Details   Frequency (times per week) 2   Duration Progress to 45 minutes of aerobic exercise without signs/symptoms of physical distress     Intensity   THRR 40-80% of Max Heartrate 105-146   Ratings of Perceived Exertion 11-13   Perceived Dyspnea 0-4     Progression   Progression Continue to progress workloads to maintain intensity without signs/symptoms of physical distress.     Resistance Training   Training Prescription Yes   Weight 3 lbs   Reps 10-15      Perform Capillary Blood Glucose checks as needed.  Exercise Prescription Changes:     Exercise Prescription Changes    Row Name 04/23/16 1400             Response to Exercise   Blood Pressure (Admit) 126/72       Blood Pressure (Exercise) 126/74       Blood Pressure (Exit) 132/66       Heart Rate (Admit) 64 bpm       Heart Rate (Exercise) 78 bpm       Heart Rate (Exit) 74 bpm       Oxygen Saturation (Admit) 99 %       Oxygen Saturation (  Exercise) 91 %       Oxygen Saturation (Exit) 96 %       Rating of Perceived Exertion  (Exercise) 10       Perceived Dyspnea (Exercise) 1       Symptoms SOB       Comments walk test results          Exercise Comments:   Exercise Goals and Review:     Exercise Goals    Row Name 04/23/16 1431             Exercise Goals   Increase Physical Activity Yes       Intervention Provide advice, education, support and counseling about physical activity/exercise needs.;Develop an individualized exercise prescription for aerobic and resistive training based on initial evaluation findings, risk stratification, comorbidities and participant's personal goals.       Expected Outcomes Achievement of increased cardiorespiratory fitness and enhanced flexibility, muscular endurance and strength shown through measurements of functional capacity and personal statement of participant.       Increase Strength and Stamina Yes       Intervention Provide advice, education, support and counseling about physical activity/exercise needs.;Develop an individualized exercise prescription for aerobic and resistive training based on initial evaluation findings, risk stratification, comorbidities and participant's personal goals.       Expected Outcomes Achievement of increased cardiorespiratory fitness and enhanced flexibility, muscular endurance and strength shown through measurements of functional capacity and personal statement of participant.          Exercise Goals Re-Evaluation :   Discharge Exercise Prescription (Final Exercise Prescription Changes):     Exercise Prescription Changes - 04/23/16 1400      Response to Exercise   Blood Pressure (Admit) 126/72   Blood Pressure (Exercise) 126/74   Blood Pressure (Exit) 132/66   Heart Rate (Admit) 64 bpm   Heart Rate (Exercise) 78 bpm   Heart Rate (Exit) 74 bpm   Oxygen Saturation (Admit) 99 %   Oxygen Saturation (Exercise) 91 %   Oxygen Saturation (Exit) 96 %   Rating of Perceived Exertion (Exercise) 10   Perceived Dyspnea (Exercise) 1    Symptoms SOB   Comments walk test results      Nutrition:  Target Goals: Understanding of nutrition guidelines, daily intake of sodium '1500mg'$ , cholesterol '200mg'$ , calories 30% from fat and 7% or less from saturated fats, daily to have 5 or more servings of fruits and vegetables.  Biometrics:     Pre Biometrics - 04/23/16 1431      Pre Biometrics   Height 5' 4.75" (1.645 m)   Weight 119 lb 14.4 oz (54.4 kg)   Waist Circumference 31.5 inches   Hip Circumference 33 inches   Waist to Hip Ratio 0.95 %   BMI (Calculated) 20.1   Single Leg Stand 30 seconds       Nutrition Therapy Plan and Nutrition Goals:     Nutrition Therapy & Goals - 04/23/16 1238      Nutrition Therapy   RD appointment defered Yes   Drug/Food Interactions Statins/Certain Fruits      Nutrition Discharge: Rate Your Plate Scores:     Nutrition Assessments - 04/23/16 1243      MEDFICTS Scores   Pre Score 88      Nutrition Goals Re-Evaluation:   Nutrition Goals Discharge (Final Nutrition Goals Re-Evaluation):   Psychosocial: Target Goals: Acknowledge presence or absence of significant depression and/or stress, maximize coping skills, provide positive support system. Participant  is able to verbalize types and ability to use techniques and skills needed for reducing stress and depression.   Initial Review & Psychosocial Screening:     Initial Psych Review & Screening - 04/23/16 1239      Initial Review   Current issues with Current Stress Concerns   Source of Stress Concerns Occupation   Comments Clinton Walsh is worried that his test this week may show lung cancer. He has smoked for years he said.      Barriers   Psychosocial barriers to participate in program The patient should benefit from training in stress management and relaxation.     Screening Interventions   Interventions Encouraged to exercise;To provide support and resources with identified psychosocial needs;Program counselor  consult      Quality of Life Scores:      Quality of Life - 04/23/16 1240      Quality of Life Scores   Health/Function Pre 16.93 %   Socioeconomic Pre 15.06 %   Psych/Spiritual Pre 24 %   Family Pre 26.4 %   GLOBAL Pre 18.98 %      PHQ-9: Recent Review Flowsheet Data    Depression screen [facility] 2/9 04/23/2016   Decreased Interest 0   Down, Depressed, Hopeless 1   PHQ - 2 Score 1   Altered sleeping 0   Tired, decreased energy 1   Change in appetite 1   Feeling bad or failure about yourself  0   Trouble concentrating 0   Moving slowly or fidgety/restless 0   Suicidal thoughts 0   PHQ-9 Score 3   Difficult doing work/chores Somewhat difficult     Interpretation of Total Score  Total Score Depression Severity:  1-4 = Minimal depression, 5-9 = Mild depression, 10-14 = Moderate depression, 15-19 = Moderately severe depression, 20-27 = Severe depression   Psychosocial Evaluation and Intervention:   Psychosocial Re-Evaluation:   Psychosocial Discharge (Final Psychosocial Re-Evaluation):   Vocational Rehabilitation: Provide vocational rehab assistance to qualifying candidates.   Vocational Rehab Evaluation & Intervention:     Vocational Rehab - 04/23/16 1236      Initial Vocational Rehab Evaluation & Intervention   Assessment shows need for Vocational Rehabilitation No      Education: Education Goals: Education classes will be provided on a weekly basis, covering required topics. Participant will state understanding/return demonstration of topics presented.  Learning Barriers/Preferences:     Learning Barriers/Preferences - 04/23/16 1234      Learning Barriers/Preferences   Learning Barriers None   Learning Preferences None      Education Topics: General Nutrition Guidelines/Fats and Fiber: -Group instruction provided by verbal, written material, models and posters to present the general guidelines for heart healthy nutrition. Gives an explanation and  review of dietary fats and fiber.   Controlling Sodium/Reading Food Labels: -Group verbal and written material supporting the discussion of sodium use in heart healthy nutrition. Review and explanation with models, verbal and written materials for utilization of the food label.   Exercise Physiology & Risk Factors: - Group verbal and written instruction with models to review the exercise physiology of the cardiovascular system and associated critical values. Details cardiovascular disease risk factors and the goals associated with each risk factor.   Aerobic Exercise & Resistance Training: - Gives group verbal and written discussion on the health impact of inactivity. On the components of aerobic and resistive training programs and the benefits of this training and how to safely progress through these programs.  Flexibility, Balance, General Exercise Guidelines: - Provides group verbal and written instruction on the benefits of flexibility and balance training programs. Provides general exercise guidelines with specific guidelines to those with heart or lung disease. Demonstration and skill practice provided.   Stress Management: - Provides group verbal and written instruction about the health risks of elevated stress, cause of high stress, and healthy ways to reduce stress.   Depression: - Provides group verbal and written instruction on the correlation between heart/lung disease and depressed mood, treatment options, and the stigmas associated with seeking treatment.   Anatomy & Physiology of the Heart: - Group verbal and written instruction and models provide basic cardiac anatomy and physiology, with the coronary electrical and arterial systems. Review of: AMI, Angina, Valve disease, Heart Failure, Cardiac Arrhythmia, Pacemakers, and the ICD.   Cardiac Procedures: - Group verbal and written instruction and models to describe the testing methods done to diagnose heart disease.  Reviews the outcomes of the test results. Describes the treatment choices: Medical Management, Angioplasty, or Coronary Bypass Surgery.   Cardiac Medications: - Group verbal and written instruction to review commonly prescribed medications for heart disease. Reviews the medication, class of the drug, and side effects. Includes the steps to properly store meds and maintain the prescription regimen.   Go Sex-Intimacy & Heart Disease, Get SMART - Goal Setting: - Group verbal and written instruction through game format to discuss heart disease and the return to sexual intimacy. Provides group verbal and written material to discuss and apply goal setting through the application of the S.M.A.R.T. Method.   Other Matters of the Heart: - Provides group verbal, written materials and models to describe Heart Failure, Angina, Valve Disease, and Diabetes in the realm of heart disease. Includes description of the disease process and treatment options available to the cardiac patient.   Exercise & Equipment Safety: - Individual verbal instruction and demonstration of equipment use and safety with use of the equipment.   Cardiac Rehab from 04/23/2016 in [facility] Cardiac and Pulmonary Rehab  Date  04/23/16  Educator  C. Enterkin, RN  Instruction Review Code  1- partially meets, needs review/practice      Infection Prevention: - Provides verbal and written material to individual with discussion of infection control including proper hand washing and proper equipment cleaning during exercise session.   Cardiac Rehab from 04/23/2016 in [facility] Cardiac and Pulmonary Rehab  Date  04/23/16  Educator  C. EnterkinRN  Instruction Review Code  2- meets goals/outcomes      Falls Prevention: - Provides verbal and written material to individual with discussion of falls prevention and safety.   Cardiac Rehab from 04/23/2016 in [facility] Cardiac and Pulmonary Rehab  Date  04/23/16  Educator  C. Lancaster  Instruction Review  Code  2- meets goals/outcomes      Diabetes: - Individual verbal and written instruction to review signs/symptoms of diabetes, desired ranges of glucose level fasting, after meals and with exercise. Advice that pre and post exercise glucose checks will be done for 3 sessions at entry of program.    Knowledge Questionnaire Score:     Knowledge Questionnaire Score - 04/23/16 1235      Knowledge Questionnaire Score   Pre Score 16/28      Core Components/Risk Factors/Patient Goals at Admission:     Personal Goals and Risk Factors at Admission - 04/23/16 1239      Core Components/Risk Factors/Patient Goals on Admission    Weight Management Yes;Weight Maintenance  Intervention Weight Management: Develop a combined nutrition and exercise program designed to reach desired caloric intake, while maintaining appropriate intake of nutrient and fiber, sodium and fats, and appropriate energy expenditure required for the weight goal.;Weight Management: Provide education and appropriate resources to help participant work on and attain dietary goals.   Admit Weight 119 lb 14.4 oz (54.4 kg)   Expected Outcomes Short Term: Continue to assess and modify interventions until short term weight is achieved;Long Term: Adherence to nutrition and physical activity/exercise program aimed toward attainment of established weight goal;Weight Maintenance: Understanding of the daily nutrition guidelines, which includes 25-35% calories from fat, 7% or less cal from saturated fats, less than '200mg'$  cholesterol, less than 1.5gm of sodium, & 5 or more servings of fruits and vegetables daily   Tobacco Cessation Yes   Intervention Assist the participant in steps to quit. Provide individualized education and counseling about committing to Tobacco Cessation, relapse prevention, and pharmacological support that can be provided by physician.;Advice worker, assist with locating and accessing local/national Quit  Smoking programs, and support quit date choice.   Expected Outcomes Short Term: Will demonstrate readiness to quit, by selecting a quit date.;Long Term: Complete abstinence from all tobacco products for at least 12 months from quit date.   Heart Failure --   Intervention --   Expected Outcomes Improve functional capacity of life;Short term: Attendance in program 2-3 days a week with increased exercise capacity. Reported lower sodium intake. Reported increased fruit and vegetable intake. Reports medication compliance.   Lipids Yes   Intervention Provide education and support for participant on nutrition & aerobic/resistive exercise along with prescribed medications to achieve LDL '70mg'$ , HDL >'40mg'$ .   Expected Outcomes Short Term: Participant states understanding of desired cholesterol values and is compliant with medications prescribed. Participant is following exercise prescription and nutrition guidelines.;Long Term: Cholesterol controlled with medications as prescribed, with individualized exercise RX and with personalized nutrition plan. Value goals: LDL < '70mg'$ , HDL > 40 mg.      Core Components/Risk Factors/Patient Goals Review:    Core Components/Risk Factors/Patient Goals at Discharge (Final Review):    ITP Comments:     ITP Comments    Row Name 04/23/16 1237 04/23/16 1245 04/25/16 0617       ITP Comments ITP Created during Medical Review. NSTEMI diagnosis4/10/2016 [facility] note Eddrick is scheduled for a head to toe PET Scan on 04/25/2016 after his Chest xray and Chest CAT SCAN revealed a lung mass.  30 day review. Continue with ITP unless directed changes per Medical Director review New to program has attended medical review        Comments:

## 2016-04-26 ENCOUNTER — Encounter
Admission: RE | Admit: 2016-04-26 | Discharge: 2016-04-26 | Disposition: A | Discharge status: Home / Self Care | Payer: Managed Care, Other (non HMO) | Source: Ambulatory Visit | Attending: Oncology | Admitting: Oncology

## 2016-04-26 DIAGNOSIS — R9389 Abnormal findings on diagnostic imaging of other specified body structures: Secondary | ICD-10-CM

## 2016-04-26 DIAGNOSIS — R918 Other nonspecific abnormal finding of lung field: Secondary | ICD-10-CM | POA: Insufficient documentation

## 2016-04-26 DIAGNOSIS — R938 Abnormal findings on diagnostic imaging of other specified body structures: Secondary | ICD-10-CM | POA: Insufficient documentation

## 2016-04-26 LAB — GLUCOSE, CAPILLARY: GLUCOSE-CAPILLARY: 58 mg/dL — AB (ref 65–99)

## 2016-04-26 MED ORDER — FLUDEOXYGLUCOSE F - 18 (FDG) INJECTION
12.0000 | Freq: Once | INTRAVENOUS | Status: AC | PRN
  Administered 2016-04-26: 12.45 via INTRAVENOUS

## 2016-04-27 ENCOUNTER — Other Ambulatory Visit: Payer: Self-pay | Admitting: *Deleted

## 2016-04-27 ENCOUNTER — Encounter: Payer: Self-pay | Admitting: *Deleted

## 2016-04-27 ENCOUNTER — Inpatient Hospital Stay (HOSPITAL_BASED_OUTPATIENT_CLINIC_OR_DEPARTMENT_OTHER): Payer: Managed Care, Other (non HMO) | Admitting: Oncology

## 2016-04-27 ENCOUNTER — Encounter: Payer: Self-pay | Admitting: Oncology

## 2016-04-27 VITALS — BP 124/85 | HR 57 | Temp 94.3°F | Resp 18 | Wt 120.4 lb

## 2016-04-27 DIAGNOSIS — R9389 Abnormal findings on diagnostic imaging of other specified body structures: Secondary | ICD-10-CM

## 2016-04-27 DIAGNOSIS — R918 Other nonspecific abnormal finding of lung field: Secondary | ICD-10-CM

## 2016-04-27 DIAGNOSIS — F17211 Nicotine dependence, cigarettes, in remission: Secondary | ICD-10-CM | POA: Diagnosis not present

## 2016-04-27 DIAGNOSIS — R938 Abnormal findings on diagnostic imaging of other specified body structures: Secondary | ICD-10-CM

## 2016-04-27 DIAGNOSIS — R590 Localized enlarged lymph nodes: Secondary | ICD-10-CM | POA: Diagnosis not present

## 2016-04-27 NOTE — Progress Notes (Signed)
Here for follow up for results of PET scan
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
## 2016-04-27 NOTE — Progress Notes (Signed)
  Oncology Nurse Navigator Documentation  Navigator Location: [facility] (04/27/16 1100)   )Navigator Encounter Type: Follow-up Appt (04/27/16 1100)                             Interventions: Coordination of Care (04/27/16 1100)   Coordination of Care: Appts (04/27/16 1100)         Met with patient during follow up visit with Dr. Janese Banks to review PET results. Per Dr. Janese Banks, pt will need to be referred to pulmonologist for further evaluation of lung abnormalities. Appt was given to see Dr. Mortimer Fries on Monday 4/30 at 9:30am. Pt and his wife verbalized understanding and stated they were aware of where his office is located. Will continue to follow patient's chart to see if needs follow up visit with Dr. Janese Banks.          Time Spent with Patient: 15 (04/27/16 1100)

## 2016-04-30 ENCOUNTER — Ambulatory Visit (INDEPENDENT_AMBULATORY_CARE_PROVIDER_SITE_OTHER): Payer: Managed Care, Other (non HMO) | Admitting: Internal Medicine

## 2016-04-30 ENCOUNTER — Encounter: Payer: Self-pay | Admitting: Internal Medicine

## 2016-04-30 VITALS — BP 116/80 | HR 61 | Resp 16 | Ht 65.0 in | Wt 118.0 lb

## 2016-04-30 DIAGNOSIS — J441 Chronic obstructive pulmonary disease with (acute) exacerbation: Secondary | ICD-10-CM

## 2016-04-30 DIAGNOSIS — J449 Chronic obstructive pulmonary disease, unspecified: Secondary | ICD-10-CM | POA: Diagnosis not present

## 2016-04-30 DIAGNOSIS — R918 Other nonspecific abnormal finding of lung field: Secondary | ICD-10-CM | POA: Diagnosis not present

## 2016-04-30 DIAGNOSIS — R0602 Shortness of breath: Secondary | ICD-10-CM

## 2016-04-30 MED ORDER — FLUTICASONE FUROATE-VILANTEROL 200-25 MCG/INH IN AEPB: 1.0000 | INHALATION_SPRAY | Freq: Every day | RESPIRATORY_TRACT | 0 refills | Status: DC

## 2016-04-30 MED ORDER — UMECLIDINIUM BROMIDE 62.5 MCG/INH IN AEPB: 1.0000 | INHALATION_SPRAY | Freq: Every day | RESPIRATORY_TRACT | 5 refills | Status: DC

## 2016-04-30 MED ORDER — FLUTICASONE FUROATE-VILANTEROL 200-25 MCG/INH IN AEPB: 1.0000 | INHALATION_SPRAY | Freq: Every day | RESPIRATORY_TRACT | 5 refills | Status: DC

## 2016-04-30 MED ORDER — UMECLIDINIUM BROMIDE 62.5 MCG/INH IN AEPB: 1.0000 | INHALATION_SPRAY | Freq: Every day | RESPIRATORY_TRACT | 0 refills | Status: DC

## 2016-04-30 MED ORDER — ALBUTEROL SULFATE HFA 108 (90 BASE) MCG/ACT IN AERS: 2.0000 | INHALATION_SPRAY | Freq: Four times a day (QID) | RESPIRATORY_TRACT | 5 refills | Status: DC | PRN
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
## 2016-04-30 NOTE — Progress Notes (Signed)
Patient ID: Clinton Walsh, male   DOB: 1960-04-16, 56 y.o.   MRN: [id number]    Patient was instructed on use of both Incruse and Breo inhalers.

## 2016-04-30 NOTE — Patient Instructions (Signed)
1. Need to see Dr End to assess when we can do procedure and assess ASA therapy prior to procedure 2.check ONO and 6WMT 3.start Breo 200, Incruse and will need albuterol as needed

## 2016-05-01 ENCOUNTER — Ambulatory Visit (INDEPENDENT_AMBULATORY_CARE_PROVIDER_SITE_OTHER): Payer: Managed Care, Other (non HMO) | Admitting: *Deleted

## 2016-05-01 ENCOUNTER — Encounter: Payer: Managed Care, Other (non HMO) | Attending: Internal Medicine

## 2016-05-01 ENCOUNTER — Telehealth: Payer: Self-pay | Admitting: Internal Medicine

## 2016-05-01 ENCOUNTER — Ambulatory Visit: Payer: Managed Care, Other (non HMO) | Admitting: Internal Medicine

## 2016-05-01 DIAGNOSIS — R0600 Dyspnea, unspecified: Secondary | ICD-10-CM | POA: Diagnosis not present

## 2016-05-01 DIAGNOSIS — I213 ST elevation (STEMI) myocardial infarction of unspecified site: Secondary | ICD-10-CM | POA: Insufficient documentation

## 2016-05-01 NOTE — Telephone Encounter (Signed)
I spoke with Dr. Mortimer Fries regarding Clinton Walsh cardiac conditions, including high-risk NSTEMI last month with DES to proximal/mid RCA. At this time, he is very high-risk for any elective procedure. I do not feel comfortable discontinuing dual antiplatelet therapy unless life-threatening bleeding occurs. I have also discussed this with Clinton Walsh. We have agreed to defer bronchoscopy at this time. While my preference would be to complete 12 months of DAPT, we could consider temporarily hold ticagrelor (potentially with bridging using tirofiban or cangrelor) if invasive procedures are necessary before then. We will cancel Clinton Walsh appointment with me this afternoon and have him follow-up as we discussed at last weeks visit.  [name], MD [facility] Pager: [phone]

## 2016-05-01 NOTE — Progress Notes (Deleted)
Follow-up Outpatient Visit Date: 05/01/2016  Primary Care Provider: Halina Maidens, MD [address] [place] [address]  Chief Complaint: Follow-up coronary artery disease  HPI:  Mr. Noorani is a 56 y.o. year-old male with history of coronary artery disease status post PCI to diagonal in 2707 complicated by acute stent thrombosis and recent high risk NSTEMI with subtotal thrombotic occlusion of the proximal/mid RCA, who presents for follow-up of coronary artery disease. He presented with acute right-sided chest pain radiating to the back on 04/10/16. CT of the chest was negative for aortic dissection. EKG changes showed borderline inferior ST segment elevation; emergent left heart catheterization was therefore performed for high-risk NSTEMI. Nonocclusive thrombus with diffuse plaquing was noted in the proximal and mid RCA, likely representing acute plaque rupture and thrombotic occlusion of the vessel with spontaneous recanalization. This was successfully treated with a single drug-eluting stent, resulting in resolution of the patient's chest pain. CTA of the chest revealed mediastinal lymphadenopathy as well as a left upper lobe mass. Recent PET/CT showed increased metabolic activity of these lesions, prompting referral to pulmonary. Mr. Podolsky was seen by Dr. Mortimer Fries yesterday, who recommended bronchoscopy with endoscopic ultrasound and biopsy. Cardiac "clearance" has been requested in anticipation of this procedure.  --------------------------------------------------------------------------------------------------  Cardiovascular History & Procedures: Cardiovascular Problems:  Coronary artery disease  Ischemic cardiomyopathy  Risk Factors:  Known coronary artery disease, male gender, and history of tobacco use  Cath/PCI:  LHC/PCI (04/10/16): LMCA normal. LAD with 30% mid vessel disease. D1 with 30% ostial stenosis and patent proximal stent with 20% in-stent  restenosis. LCx with 50% proximal/mid disease as well as 40% proximal OM 2 stenosis. Large, dominant RCA with 30% ostial and 90% proximal/mid stenosis with intraluminal thrombus. Successful IVUS-guided PCI to the proximal/mid RCA with placement of a Resolute Integrity 3.0 x 34 mm drug-eluting stent. LVEDP 13 mmHg  CV Surgery:  None  EP Procedures and Devices:  None  Non-Invasive Evaluation(s):  TTE (04/10/16): Normal LV size with LVEF of 30-35% and hypokinesis of the inferior and inferolateral myocardium. Grade 1 diastolic dysfunction. Trivial aortic regurgitation. Mild to moderate mitral regurgitation. Mildly reduced right ventricular contraction. Normal pulmonary artery pressure.  Recent CV Pertinent Labs: Lab Results  Component Value Date   CHOL 183 04/11/2016   CHOL 192 04/14/2011   HDL 34 (L) 04/11/2016   HDL 31 (L) 04/14/2011   LDLCALC 131 (H) 04/11/2016   LDLCALC 134 (H) 04/14/2011   TRIG 90 04/11/2016   TRIG 137 04/14/2011   CHOLHDL 5.4 04/11/2016   INR 0.96 04/10/2016   K 3.8 04/11/2016   K 3.9 09/19/2011   MG 1.8 04/15/2011   BUN 18 04/11/2016   BUN 14 08/29/2015   BUN 14 09/19/2011   CREATININE 0.93 04/11/2016   CREATININE 0.97 09/19/2011    Past medical and surgical history were reviewed and updated in EPIC.  Outpatient Encounter Prescriptions as of 05/01/2016  Medication Sig  . albuterol (PROVENTIL HFA;VENTOLIN HFA) 108 (90 Base) MCG/ACT inhaler Inhale 2 puffs into the lungs every 6 (six) hours as needed for wheezing or shortness of breath.  Marland Kitchen aspirin EC 81 MG tablet Take 1 tablet (81 mg total) by mouth daily.  Marland Kitchen atorvastatin (LIPITOR) 80 MG tablet Take 1 tablet (80 mg total) by mouth daily at 6 PM.  . carvedilol (COREG) 3.125 MG tablet Take 1 tablet (3.125 mg total) by mouth 2 (two) times daily with a meal.  . fluticasone furoate-vilanterol (BREO ELLIPTA) 200-25 MCG/INH AEPB  Inhale 1 puff into the lungs daily.  Marland Kitchen lisinopril (PRINIVIL,ZESTRIL) 5 MG tablet Take  1 tablet (5 mg total) by mouth daily.  . ticagrelor (BRILINTA) 90 MG TABS tablet Take 1 tablet (90 mg total) by mouth 2 (two) times daily.  Marland Kitchen umeclidinium bromide (INCRUSE ELLIPTA) 62.5 MCG/INH AEPB Inhale 1 puff into the lungs daily.   No facility-administered encounter medications on file as of 05/01/2016.     Allergies: Sulfa antibiotics  Social History   Social History  . Marital status: Married    Spouse name: N/A  . Number of children: N/A  . Years of education: N/A   Occupational History  . 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 Smoking status: Former Smoker    Packs/day: 0.50    Years: 30.00    Types: E-cigarettes, Cigarettes    Quit date: 07/2015  . Smokeless tobacco: Never Used     Comment: quit 7/17, uses vap cigarettes now 6 mg nicotine-just dropped to 3 mg " to make heart Dr happy"per pt  . Alcohol use No     Comment: none x 10y  . Drug use: No  . Sexual activity: Not Currently   Other Topics Concern  . Not on file   Social History Narrative   Lives in [place] with his wife    Family History  Problem Relation Age of Onset  . Other Father     "Heart problems" pacemaker  . Prostate cancer Father   . Heart attack Father   . Other Mother     "heart problems"  . Heart attack Mother   . Other Maternal Grandfather     "heart exploded" in his 16s    Review of Systems: A 12-system review of systems was performed and was negative except as noted in the HPI.  --------------------------------------------------------------------------------------------------  Physical Exam: There were no vitals taken for this visit.  General:  Thin man, appearing older than his stated age, seated comfortably in the exam room. HEENT: No conjunctival pallor or scleral icterus.  Moist mucous membranes.  OP clear. Neck: Supple without lymphadenopathy, thyromegaly, JVD, or HJR. Lungs: Normal work of breathing.   Mildly diminished breath sounds throughout. No wheezes or crackles. Heart: Regular rate and rhythm without murmurs, rubs, or gallops.  Non-displaced PMI. Abd: Bowel sounds present.  Soft, NT/ND without hepatosplenomegaly Ext: No lower extremity edema.  Radial, PT, and DP pulses are 2+ bilaterally. Right radial arteriotomy site well healed. Skin: warm and dry without rash  EKG:  Normal sinus rhythm with left posterior fascicular block and inferior T-wave inversions. T-wave flattening now evident in V5 and V6. Otherwise, there has been no significant change from prior tracing on 04/11/16 (I have personally reviewed both tracings).  Lab Results  Component Value Date   WBC 4.6 04/11/2016   HGB 13.7 04/11/2016   HCT 40.2 04/11/2016   MCV 89.7 04/11/2016   PLT 224 04/11/2016    Lab Results  Component Value Date   NA 138 04/11/2016   K 3.8 04/11/2016   CL 109 04/11/2016   CO2 24 04/11/2016   BUN 18 04/11/2016   CREATININE 0.93 04/11/2016   GLUCOSE 88 04/11/2016   ALT 14 (L) 04/10/2016    Lab Results  Component Value Date   CHOL 183 04/11/2016   HDL 34 (L) 04/11/2016   LDLCALC 131 (H) 04/11/2016   TRIG 90 04/11/2016  CHOLHDL 5.4 04/11/2016   --------------------------------------------------------------------------------------------------  ASSESSMENT AND PLAN: Coronary artery disease with atypical chest pain Patient has recovered relatively well from his recent high-risk NSTEMI. His episodes of "electric shock" like chest pain are unlikely to be related to worsening coronary insufficiency, given the pain's quality, brief duration, and nonexertional nature. We will continue his current medication regimen, including dual antiplatelet therapy with aspirin and ticagrelor, as well as high-intensity atorvastatin. I have encouraged the patient to proceed with cardiac rehabilitation. I think it is reasonable for him to return to work, though he should limit his activity to begin with and  increase it gradually as tolerated.  Ischemic cardiomyopathy Post MI, Mr. Haffey was noted to have moderately to severely reduced LV contraction with an EF of 30-35%. He appears euvolemic and well compensated on exam today. Some of his exertional dyspnea and fatigue may be related to heart failure (NYHA class 2). We will continue current doses of carvedilol and lisinopril. His borderline low heart rate and blood pressure preclude further up titration. We will plan to repeat an echocardiogram 3 months after his acute MI to reevaluate LVEF.  Lung mass and mediastinal lymphadenopathy Patient is currently undergoing evaluation in the [facility].  Follow-up: Return to clinic in 6 weeks.  [name], MD 05/01/2016 11:19 AM

## 2016-05-01 NOTE — Telephone Encounter (Signed)
Appt for today with Dr End cancelled.

## 2016-05-01 NOTE — Progress Notes (Signed)
SIX MIN WALK 05/01/2016 10/16/2011  Medications Atorvastatin '80mg'$ ;Carvedilol 3.125 mg; Lisinopril '5mg'$ ; Ticagrelor 90 mg -  Supplimental Oxygen during Test? (L/min) No No  Laps 5 -  Partial Lap (in Meters) 2 -  Baseline BP (sitting) 100/60 -  Baseline Heartrate 85 -  Baseline Dyspnea (Borg Scale) 3 -  Baseline Fatigue (Borg Scale) 2 -  Baseline SPO2 99 -  BP (sitting) 110/80 -  Heartrate 73 -  Dyspnea (Borg Scale) 4 -  Fatigue (Borg Scale) 3 -  SPO2 97 -  BP (sitting) 110/70 -  Heartrate 72 -  SPO2 97 -  Stopped or Paused before Six Minutes No -  Interpretation Angina;Dizziness -  Distance Completed 242 -  Tech Comments: Patient talked entire time during walk. Patient walked at slow pace, he was asked if he could walk any faster replied "no". After walking 400 ft, o2 sat was 97% ra and pulse 83/pt wished to stop due to increased SOB//lmr        SWM performed today.

## 2016-05-08 ENCOUNTER — Other Ambulatory Visit: Payer: Self-pay | Admitting: *Deleted

## 2016-05-08 ENCOUNTER — Encounter: Payer: Managed Care, Other (non HMO) | Admitting: *Deleted

## 2016-05-08 DIAGNOSIS — R918 Other nonspecific abnormal finding of lung field: Secondary | ICD-10-CM

## 2016-05-08 DIAGNOSIS — Z955 Presence of coronary angioplasty implant and graft: Secondary | ICD-10-CM

## 2016-05-08 DIAGNOSIS — I213 ST elevation (STEMI) myocardial infarction of unspecified site: Secondary | ICD-10-CM | POA: Diagnosis present

## 2016-05-08 DIAGNOSIS — I2111 ST elevation (STEMI) myocardial infarction involving right coronary artery: Secondary | ICD-10-CM

## 2016-05-08 NOTE — Progress Notes (Signed)
CT chest with ordered per DK for 3 months with a followup.

## 2016-05-08 NOTE — Progress Notes (Signed)
Daily Session Note  Patient Details  Name: Clinton Walsh MRN: [id number] Date of Birth: [birth year] Referring Provider:     Cardiac Rehab from 04/23/2016 in [facility]  Referring Provider  End, Harrell Gave MD      Encounter Date: 05/08/2016  Check In:     Session Check In - 05/08/16 1208      Check-In   Tobacco Cessation Use Increase  "has not smoked for 9 months" per [name]         History  Smoking Status  . Former Smoker  . Packs/day: 0.50  . Years: 30.00  . Types: E-cigarettes, Cigarettes  . Quit date: 07/2015  Smokeless Tobacco  . Never Used    Comment: quit 7/17, uses vap cigarettes now 6 mg nicotine-just dropped to 3 mg " to make heart Dr happy"per pt    Goals Met:  Independence with exercise equipment  Goals Unmet:  PD  Comments:   [name] said this exercising is making him feel worse so he will not be back.    Dr. Emily Filbert is Medical Director for [facility].

## 2016-05-11 ENCOUNTER — Other Ambulatory Visit: Payer: Self-pay | Admitting: Internal Medicine

## 2016-05-11 ENCOUNTER — Telehealth: Payer: Self-pay | Admitting: Internal Medicine

## 2016-05-11 MED ORDER — ATORVASTATIN CALCIUM 80 MG PO TABS: 80.0000 mg | ORAL_TABLET | Freq: Every day | ORAL | 0 refills | Status: DC

## 2016-05-11 MED ORDER — CARVEDILOL 3.125 MG PO TABS: 3.1250 mg | ORAL_TABLET | Freq: Two times a day (BID) | ORAL | 0 refills | Status: DC

## 2016-05-11 MED ORDER — LISINOPRIL 5 MG PO TABS: 5.0000 mg | ORAL_TABLET | Freq: Every day | ORAL | 0 refills | Status: DC

## 2016-05-11 MED ORDER — TICAGRELOR 90 MG PO TABS: 90.0000 mg | ORAL_TABLET | Freq: Two times a day (BID) | ORAL | 0 refills | Status: DC

## 2016-05-11 NOTE — Telephone Encounter (Signed)
Refills sent

## 2016-05-11 NOTE — Telephone Encounter (Signed)
°*  STAT* If patient is at the pharmacy, call can be transferred to refill team.   1. Which medications need to be refilled? (please list name of each medication and dose if known)  Lisinopril Lipitor  Coreg Brilinta   2. Which pharmacy/location (including street and city if local pharmacy) is medication to be sent to?[facility] on [address]   3. Do they need a 30 day or 90 day supply? 90 day

## 2016-05-23 ENCOUNTER — Encounter: Payer: Self-pay | Admitting: *Deleted

## 2016-05-23 DIAGNOSIS — Z955 Presence of coronary angioplasty implant and graft: Secondary | ICD-10-CM

## 2016-05-23 DIAGNOSIS — I2111 ST elevation (STEMI) myocardial infarction involving right coronary artery: Secondary | ICD-10-CM

## 2016-05-23 NOTE — Progress Notes (Signed)
Cardiac Individual Treatment Plan  Patient Details  Name: Clinton Walsh MRN: [id number] Date of Birth: March 16, 1960 Referring Provider:     Cardiac Rehab from 04/23/2016 in [facility] Cardiac and Pulmonary Rehab  Referring Provider  End, Harrell Gave MD      Initial Encounter Date:    Cardiac Rehab from 04/23/2016 in [facility] Cardiac and Pulmonary Rehab  Date  04/23/16  Referring Provider  End, Harrell Gave MD      Visit Diagnosis: ST elevation myocardial infarction involving right coronary artery [facility])  Status post coronary artery stent placement  Patient's Home Medications on Admission:  Current Outpatient Prescriptions:  .  albuterol (PROVENTIL HFA;VENTOLIN HFA) 108 (90 Base) MCG/ACT inhaler, Inhale 2 puffs into the lungs every 6 (six) hours as needed for wheezing or shortness of breath., Disp: 1 Inhaler, Rfl: 5 .  aspirin EC 81 MG tablet, Take 1 tablet (81 mg total) by mouth daily., Disp: 30 tablet, Rfl: 0 .  atorvastatin (LIPITOR) 80 MG tablet, Take 1 tablet (80 mg total) by mouth daily at 6 PM., Disp: 90 tablet, Rfl: 0 .  carvedilol (COREG) 3.125 MG tablet, Take 1 tablet (3.125 mg total) by mouth 2 (two) times daily with a meal., Disp: 180 tablet, Rfl: 0 .  fluticasone furoate-vilanterol (BREO ELLIPTA) 200-25 MCG/INH AEPB, Inhale 1 puff into the lungs daily., Disp: 60 each, Rfl: 0 .  lisinopril (PRINIVIL,ZESTRIL) 5 MG tablet, Take 1 tablet (5 mg total) by mouth daily., Disp: 90 tablet, Rfl: 0 .  ticagrelor (BRILINTA) 90 MG TABS tablet, Take 1 tablet (90 mg total) by mouth 2 (two) times daily., Disp: 180 tablet, Rfl: 0 .  umeclidinium bromide (INCRUSE ELLIPTA) 62.5 MCG/INH AEPB, Inhale 1 puff into the lungs daily., Disp: 30 each, Rfl: 0  Past Medical History: Past Medical History:  Diagnosis Date  . CAD (coronary artery disease)    a.  2008 Cath:  nonobstructive plaque, NL LV;  b. 03/2011 NSTEMI/Cath:  100% D1, otw nonobs dzs, EF 45-50%.  D1 stented w/  2.25x18 MiniVision BMS.  Presented on 04/14/11 with NSTEMI. Cath showed stent thrmobosis likely due to not taking DAPT.  Treated with thrombectomy and angioplasty.   . Hyperlipidemia   . Myocardial infarct [facility])    april 10th/18-had 3 stent put in.( has had 3 MI total )  . Polymyalgia rheumatica ([place]) 02/2015  . Pulmonary nodules    nonspecific nodules in the right middle lobe  and right lower lobe by CT '08  . Tobacco abuse     Tobacco Use: History  Smoking Status  . Former Smoker  . Packs/day: 0.50  . Years: 30.00  . Types: E-cigarettes, Cigarettes  . Quit date: 07/2015  Smokeless Tobacco  . Never Used    Comment: quit 7/17, uses vap cigarettes now 6 mg nicotine-just dropped to 3 mg " to make heart Dr happy"per pt    Labs: Recent Review Flowsheet Data    Labs for ITP Cardiac and Pulmonary Rehab Latest Ref Rng & Units 10/08/2006 10/09/2006 03/17/2011 04/14/2011 04/11/2016   Cholestrol 0 - 200 mg/dL - 212 ATP III CLASSIFICATION: <200     mg/dL   Desirable 200-239  mg/dL   Borderline High >=240    mg/dL   High(H) 173 192 183   LDLCALC 0 - 99 mg/dL - 146 Total Cholesterol/HDL:CHD Risk Coronary Heart Disease Risk Table Men   Women 1/2 Average Risk   3.4   3.3(H) 118(H) 134(H) 131(H)   HDL >40 mg/dL - 23(L) 38(L) 31(L)  34(L)   Trlycerides <150 mg/dL - 213(H) 83 137 90   Hemoglobin A1c 4.8 - 5.6 % - - 5.6 - 5.7(H)   HCO3 - 21.9 - - - -   TCO2 - 23 - - - -       Exercise Target Goals:    Exercise Program Goal: Individual exercise prescription set with THRR, safety & activity barriers. Participant demonstrates ability to understand and report RPE using BORG scale, to self-measure pulse accurately, and to acknowledge the importance of the exercise prescription.  Exercise Prescription Goal: Starting with aerobic activity 30 plus minutes a day, 3 days per week for initial exercise prescription. Provide home exercise prescription and guidelines that participant acknowledges understanding prior to  discharge.  Activity Barriers & Risk Stratification:     Activity Barriers & Cardiac Risk Stratification - 04/23/16 1312      Activity Barriers & Cardiac Risk Stratification   Activity Barriers Back Problems;Deconditioning;Muscular Weakness;Shortness of Breath;Joint Problems  joints achy, torn meniscus in both knees, crushed vertebrae in 1985   Cardiac Risk Stratification High      6 Minute Walk:     6 Minute Walk    Row Name 04/23/16 1427         6 Minute Walk   Phase Initial     Distance 990 feet     Walk Time 6 minutes     # of Rest Breaks 0     MPH 1.87     METS 3.4     RPE 10     Perceived Dyspnea  1     VO2 Peak 11.9     Symptoms Yes (comment)     Comments SOB     Resting HR 64 bpm     Resting BP 126/72     Max Ex. HR 78 bpm     Max Ex. BP 126/74     2 Minute Post BP 132/66        Oxygen Initial Assessment:   Oxygen Re-Evaluation:   Oxygen Discharge (Final Oxygen Re-Evaluation):   Initial Exercise Prescription:     Initial Exercise Prescription - 04/23/16 1400      Date of Initial Exercise RX and Referring Provider   Date 04/23/16   Referring Provider End, Harrell Gave MD     Treadmill   MPH 1.8   Grade 0   Minutes 15   METs 2.38     Recumbant Bike   Level 1   RPM 50   Watts 49   Minutes 15   METs 2     NuStep   Level 2   SPM 80   Minutes 15   METs 2     Prescription Details   Frequency (times per week) 2   Duration Progress to 45 minutes of aerobic exercise without signs/symptoms of physical distress     Intensity   THRR 40-80% of Max Heartrate 105-146   Ratings of Perceived Exertion 11-13   Perceived Dyspnea 0-4     Progression   Progression Continue to progress workloads to maintain intensity without signs/symptoms of physical distress.     Resistance Training   Training Prescription Yes   Weight 3 lbs   Reps 10-15      Perform Capillary Blood Glucose checks as needed.  Exercise Prescription Changes:      Exercise Prescription Changes    Row Name 04/23/16 1400 05/16/16 1000           Response to Exercise  Blood Pressure (Admit) 126/72 122/60      Blood Pressure (Exercise) 126/74 106/54      Blood Pressure (Exit) 132/66 102/60      Heart Rate (Admit) 64 bpm 60 bpm      Heart Rate (Exercise) 78 bpm 80 bpm      Heart Rate (Exit) 74 bpm 74 bpm      Oxygen Saturation (Admit) 99 %  -      Oxygen Saturation (Exercise) 91 %  -      Oxygen Saturation (Exit) 96 %  -      Rating of Perceived Exertion (Exercise) 10 15      Perceived Dyspnea (Exercise) 1  -      Symptoms SOB SOB, chest soreness      Comments walk test results first day      Duration  - Progress to 45 minutes of aerobic exercise without signs/symptoms of physical distress      Intensity  - THRR unchanged        Progression   Progression  - Continue to progress workloads to maintain intensity without signs/symptoms of physical distress.        Recumbant Bike   Level  - 1      Minutes  - 10         Exercise Comments:   Exercise Goals and Review:     Exercise Goals    Row Name 04/23/16 1431             Exercise Goals   Increase Physical Activity Yes       Intervention Provide advice, education, support and counseling about physical activity/exercise needs.;Develop an individualized exercise prescription for aerobic and resistive training based on initial evaluation findings, risk stratification, comorbidities and participant's personal goals.       Expected Outcomes Achievement of increased cardiorespiratory fitness and enhanced flexibility, muscular endurance and strength shown through measurements of functional capacity and personal statement of participant.       Increase Strength and Stamina Yes       Intervention Provide advice, education, support and counseling about physical activity/exercise needs.;Develop an individualized exercise prescription for aerobic and resistive training based on initial evaluation  findings, risk stratification, comorbidities and participant's personal goals.       Expected Outcomes Achievement of increased cardiorespiratory fitness and enhanced flexibility, muscular endurance and strength shown through measurements of functional capacity and personal statement of participant.          Exercise Goals Re-Evaluation :     Exercise Goals Re-Evaluation    [place] Name 05/16/16 1042             Exercise Goal Re-Evaluation   Exercise Goals Review Increase Physical Activity;Increase Strenth and Stamina       Comments Clinton Walsh has completed his first full day of exercise.  He had a hard time moving and breathing due to his pulmonary issues.  We will continue to work with him and finding exercise that works best for him.       Expected Outcomes Short and Long: Attend classes regularly to work on strength and stamina.          Discharge Exercise Prescription (Final Exercise Prescription Changes):     Exercise Prescription Changes - 05/16/16 1000      Response to Exercise   Blood Pressure (Admit) 122/60   Blood Pressure (Exercise) 106/54   Blood Pressure (Exit) 102/60   Heart Rate (Admit) 60 bpm   Heart  Rate (Exercise) 80 bpm   Heart Rate (Exit) 74 bpm   Rating of Perceived Exertion (Exercise) 15   Symptoms SOB, chest soreness   Comments first day   Duration Progress to 45 minutes of aerobic exercise without signs/symptoms of physical distress   Intensity THRR unchanged     Progression   Progression Continue to progress workloads to maintain intensity without signs/symptoms of physical distress.     Recumbant Bike   Level 1   Minutes 10      Nutrition:  Target Goals: Understanding of nutrition guidelines, daily intake of sodium 1500mg , cholesterol 200mg , calories 30% from fat and 7% or less from saturated fats, daily to have 5 or more servings of fruits and vegetables.  Biometrics:     Pre Biometrics - 04/23/16 1431      Pre Biometrics   Height 5'  4.75" (1.645 m)   Weight 119 lb 14.4 oz (54.4 kg)   Waist Circumference 31.5 inches   Hip Circumference 33 inches   Waist to Hip Ratio 0.95 %   BMI (Calculated) 20.1   Single Leg Stand 30 seconds       Nutrition Therapy Plan and Nutrition Goals:     Nutrition Therapy & Goals - 04/23/16 1238      Nutrition Therapy   RD appointment defered Yes   Drug/Food Interactions Statins/Certain Fruits      Nutrition Discharge: Rate Your Plate Scores:     Nutrition Assessments - 04/23/16 1243      MEDFICTS Scores   Pre Score 88      Nutrition Goals Re-Evaluation:   Nutrition Goals Discharge (Final Nutrition Goals Re-Evaluation):   Psychosocial: Target Goals: Acknowledge presence or absence of significant depression and/or stress, maximize coping skills, provide positive support system. Participant is able to verbalize types and ability to use techniques and skills needed for reducing stress and depression.   Initial Review & Psychosocial Screening:     Initial Psych Review & Screening - 04/23/16 1239      Initial Review   Current issues with Current Stress Concerns   Source of Stress Concerns Occupation   Comments Clinton Walsh is worried that his test this week may show lung cancer. He has smoked for years he said.      Barriers   Psychosocial barriers to participate in program The patient should benefit from training in stress management and relaxation.     Screening Interventions   Interventions Encouraged to exercise;To provide support and resources with identified psychosocial needs;Program counselor consult      Quality of Life Scores:      Quality of Life - 04/23/16 1240      Quality of Life Scores   Health/Function Pre 16.93 %   Socioeconomic Pre 15.06 %   Psych/Spiritual Pre 24 %   Family Pre 26.4 %   GLOBAL Pre 18.98 %      PHQ-9: Recent Review Flowsheet Data    Depression screen [facility] 2/9 04/23/2016   Decreased Interest 0   Down, Depressed, Hopeless 1    PHQ - 2 Score 1   Altered sleeping 0   Tired, decreased energy 1   Change in appetite 1   Feeling bad or failure about yourself  0   Trouble concentrating 0   Moving slowly or fidgety/restless 0   Suicidal thoughts 0   PHQ-9 Score 3   Difficult doing work/chores Somewhat difficult     Interpretation of Total Score  Total Score Depression Severity:  1-4 = Minimal depression, 5-9 = Mild depression, 10-14 = Moderate depression, 15-19 = Moderately severe depression, 20-27 = Severe depression   Psychosocial Evaluation and Intervention:   Psychosocial Re-Evaluation:   Psychosocial Discharge (Final Psychosocial Re-Evaluation):   Vocational Rehabilitation: Provide vocational rehab assistance to qualifying candidates.   Vocational Rehab Evaluation & Intervention:     Vocational Rehab - 04/23/16 1236      Initial Vocational Rehab Evaluation & Intervention   Assessment shows need for Vocational Rehabilitation No      Education: Education Goals: Education classes will be provided on a weekly basis, covering required topics. Participant will state understanding/return demonstration of topics presented.  Learning Barriers/Preferences:     Learning Barriers/Preferences - 04/23/16 1234      Learning Barriers/Preferences   Learning Barriers None   Learning Preferences None      Education Topics: General Nutrition Guidelines/Fats and Fiber: -Group instruction provided by verbal, written material, models and posters to present the general guidelines for heart healthy nutrition. Gives an explanation and review of dietary fats and fiber.   Controlling Sodium/Reading Food Labels: -Group verbal and written material supporting the discussion of sodium use in heart healthy nutrition. Review and explanation with models, verbal and written materials for utilization of the food label.   Cardiac Rehab from 05/08/2016 in [facility] Cardiac and Pulmonary Rehab  Date  05/08/16  Educator  RC   Instruction Review Code  2- meets goals/outcomes      Exercise Physiology & Risk Factors: - Group verbal and written instruction with models to review the exercise physiology of the cardiovascular system and associated critical values. Details cardiovascular disease risk factors and the goals associated with each risk factor.   Aerobic Exercise & Resistance Training: - Gives group verbal and written discussion on the health impact of inactivity. On the components of aerobic and resistive training programs and the benefits of this training and how to safely progress through these programs.   Flexibility, Balance, General Exercise Guidelines: - Provides group verbal and written instruction on the benefits of flexibility and balance training programs. Provides general exercise guidelines with specific guidelines to those with heart or lung disease. Demonstration and skill practice provided.   Stress Management: - Provides group verbal and written instruction about the health risks of elevated stress, cause of high stress, and healthy ways to reduce stress.   Depression: - Provides group verbal and written instruction on the correlation between heart/lung disease and depressed mood, treatment options, and the stigmas associated with seeking treatment.   Anatomy & Physiology of the Heart: - Group verbal and written instruction and models provide basic cardiac anatomy and physiology, with the coronary electrical and arterial systems. Review of: AMI, Angina, Valve disease, Heart Failure, Cardiac Arrhythmia, Pacemakers, and the ICD.   Cardiac Procedures: - Group verbal and written instruction and models to describe the testing methods done to diagnose heart disease. Reviews the outcomes of the test results. Describes the treatment choices: Medical Management, Angioplasty, or Coronary Bypass Surgery.   Cardiac Medications: - Group verbal and written instruction to review commonly prescribed  medications for heart disease. Reviews the medication, class of the drug, and side effects. Includes the steps to properly store meds and maintain the prescription regimen.   Go Sex-Intimacy & Heart Disease, Get SMART - Goal Setting: - Group verbal and written instruction through game format to discuss heart disease and the return to sexual intimacy. Provides group verbal and written material to discuss and apply goal setting  through the application of the S.M.A.R.T. Method.   Other Matters of the Heart: - Provides group verbal, written materials and models to describe Heart Failure, Angina, Valve Disease, and Diabetes in the realm of heart disease. Includes description of the disease process and treatment options available to the cardiac patient.   Exercise & Equipment Safety: - Individual verbal instruction and demonstration of equipment use and safety with use of the equipment.   Cardiac Rehab from 05/08/2016 in [facility] Cardiac and Pulmonary Rehab  Date  04/23/16  Educator  C. Enterkin, RN  Instruction Review Code  1- partially meets, needs review/practice      Infection Prevention: - Provides verbal and written material to individual with discussion of infection control including proper hand washing and proper equipment cleaning during exercise session.   Cardiac Rehab from 05/08/2016 in [facility] Cardiac and Pulmonary Rehab  Date  04/23/16  Educator  C. EnterkinRN  Instruction Review Code  2- meets goals/outcomes      Falls Prevention: - Provides verbal and written material to individual with discussion of falls prevention and safety.   Cardiac Rehab from 05/08/2016 in [facility] Cardiac and Pulmonary Rehab  Date  04/23/16  Educator  C. [place]  Instruction Review Code  2- meets goals/outcomes      Diabetes: - Individual verbal and written instruction to review signs/symptoms of diabetes, desired ranges of glucose level fasting, after meals and with exercise. Advice that pre and post  exercise glucose checks will be done for 3 sessions at entry of program.    Knowledge Questionnaire Score:     Knowledge Questionnaire Score - 04/23/16 1235      Knowledge Questionnaire Score   Pre Score 16/28      Core Components/Risk Factors/Patient Goals at Admission:     Personal Goals and Risk Factors at Admission - 04/23/16 1239      Core Components/Risk Factors/Patient Goals on Admission    Weight Management Yes;Weight Maintenance   Intervention Weight Management: Develop a combined nutrition and exercise program designed to reach desired caloric intake, while maintaining appropriate intake of nutrient and fiber, sodium and fats, and appropriate energy expenditure required for the weight goal.;Weight Management: Provide education and appropriate resources to help participant work on and attain dietary goals.   Admit Weight 119 lb 14.4 oz (54.4 kg)   Expected Outcomes Short Term: Continue to assess and modify interventions until short term weight is achieved;Long Term: Adherence to nutrition and physical activity/exercise program aimed toward attainment of established weight goal;Weight Maintenance: Understanding of the daily nutrition guidelines, which includes 25-35% calories from fat, 7% or less cal from saturated fats, less than 200mg  cholesterol, less than 1.5gm of sodium, & 5 or more servings of fruits and vegetables daily   Tobacco Cessation Yes   Intervention Assist the participant in steps to quit. Provide individualized education and counseling about committing to Tobacco Cessation, relapse prevention, and pharmacological support that can be provided by physician.;Advice worker, assist with locating and accessing local/national Quit Smoking programs, and support quit date choice.   Expected Outcomes Short Term: Will demonstrate readiness to quit, by selecting a quit date.;Long Term: Complete abstinence from all tobacco products for at least 12 months from  quit date.   Heart Failure --   Intervention --   Expected Outcomes Improve functional capacity of life;Short term: Attendance in program 2-3 days a week with increased exercise capacity. Reported lower sodium intake. Reported increased fruit and vegetable intake. Reports medication compliance.   Lipids  Yes   Intervention Provide education and support for participant on nutrition & aerobic/resistive exercise along with prescribed medications to achieve LDL 70mg , HDL >40mg .   Expected Outcomes Short Term: Participant states understanding of desired cholesterol values and is compliant with medications prescribed. Participant is following exercise prescription and nutrition guidelines.;Long Term: Cholesterol controlled with medications as prescribed, with individualized exercise RX and with personalized nutrition plan. Value goals: LDL < 70mg , HDL > 40 mg.      Core Components/Risk Factors/Patient Goals Review:    Core Components/Risk Factors/Patient Goals at Discharge (Final Review):    ITP Comments:     ITP Comments    Row Name 04/23/16 1237 04/23/16 1245 04/25/16 0617 05/08/16 1204 05/23/16 0858   ITP Comments ITP Created during Medical Review. NSTEMI diagnosis4/10/2016 [facility] note Stella is scheduled for a head to toe PET Scan on 04/25/2016 after his Chest xray and Chest CAT SCAN revealed a lung mass.  30 day review. Continue with ITP unless directed changes per Medical Director review New to program has attended medical review Clinton Walsh did not want to go to the Emerg Dept although he c/o pain right chest area" I have this all the time and this exercise is making it worse. I told the doctor this would not help me." Clinton Walsh asked if we had anything for his breathing. Instructed by our resp therapist about pursed lip breathing and diaghramatic breathing.. Given written info also. Clinton Walsh said he would not be back to Cardiac Rehab. Clinton Walsh reported that the Lung doctor  wants to do a lung biopsy to see what these spos on my lungs are but the heart doctor said if he stops my heart medicine I would probably have a heart attack. Clinton Walsh reports that is has been tough but he quit smoking 9 months ago and has not started back.  30 day review. Continue with ITP unless directed changes per Medical Director review  OUt for medical reasons      Comments:

## 2016-05-29 ENCOUNTER — Telehealth: Payer: Self-pay | Admitting: *Deleted

## 2016-05-29 ENCOUNTER — Encounter: Payer: Self-pay | Admitting: *Deleted

## 2016-05-29 DIAGNOSIS — I2111 ST elevation (STEMI) myocardial infarction involving right coronary artery: Secondary | ICD-10-CM

## 2016-05-29 DIAGNOSIS — Z955 Presence of coronary angioplasty implant and graft: Secondary | ICD-10-CM

## 2016-05-29 NOTE — Telephone Encounter (Signed)
Called to check on status of return.  Has only attended one session.  Left message.

## 2016-06-05 ENCOUNTER — Encounter: Payer: Managed Care, Other (non HMO) | Attending: Internal Medicine

## 2016-06-05 DIAGNOSIS — I213 ST elevation (STEMI) myocardial infarction of unspecified site: Secondary | ICD-10-CM | POA: Insufficient documentation

## 2016-06-06 ENCOUNTER — Encounter: Payer: Self-pay | Admitting: Internal Medicine

## 2016-06-06 ENCOUNTER — Ambulatory Visit (INDEPENDENT_AMBULATORY_CARE_PROVIDER_SITE_OTHER): Payer: Managed Care, Other (non HMO) | Admitting: Internal Medicine

## 2016-06-06 VITALS — BP 126/74 | HR 52 | Ht 65.0 in | Wt 118.5 lb

## 2016-06-06 DIAGNOSIS — R918 Other nonspecific abnormal finding of lung field: Secondary | ICD-10-CM

## 2016-06-06 DIAGNOSIS — I5022 Chronic systolic (congestive) heart failure: Secondary | ICD-10-CM | POA: Diagnosis not present

## 2016-06-06 DIAGNOSIS — I25118 Atherosclerotic heart disease of native coronary artery with other forms of angina pectoris: Secondary | ICD-10-CM | POA: Diagnosis not present

## 2016-06-06 DIAGNOSIS — I255 Ischemic cardiomyopathy: Secondary | ICD-10-CM | POA: Diagnosis not present

## 2016-06-06 MED ORDER — PRASUGREL HCL 10 MG PO TABS
10.0000 mg | ORAL_TABLET | Freq: Every day | ORAL | 3 refills | Status: AC
Start: 2016-06-06 — End: 2016-09-04

## 2016-06-06 NOTE — Progress Notes (Signed)
Follow-up Outpatient Visit Date: 06/06/2016  Primary Care Provider: Glean Hess, MD [address] [place] [address]  Chief Complaint: Shortness of breath and chest pain  HPI:  Clinton Walsh is a 56 y.o. 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 I last saw him on 04/24/16. Since that time, he has felt short of breath. He notes this began after his PCI in April and is concerned that it may be related to ticagrelor. It does not seem to worsen with position or activity. He also notes intermittent chest pains that "jump around" in his chest. At times, the pain is sharp. At other times it is dull. It lasts anywhere from a few seconds to 10 minutes. It is not exertional and is without accompanying symptoms. He also has tingling and numbness in his arms and legs from time to time, which concerns him for a circulatory problem. He denies edema, orthopnea, PND, palpitations, and lightheadedness. He has not had any significant bleeding. He has been compliant with his medications.  In regard to his acute stent thrombosis in 2012, Clinton Walsh believes that he was compliant with clopidogrel during that time. However, his wife has previously stated that Clinton Walsh was not taking DAPT reliably.  Clinton Walsh was seen by Dr. Mortimer Fries in the pulmonary clinic on 04/30/16. Bronchoscopy with endoscopic ultrasound and biopsy were recommended. However, given recent preceding acute MI with drug-eluting stent placement, the decision was made to defer biopsy due to high risk of bleeding and cardiac complication in favor of short-term surveillance  CT.  -------------------------------------------------------------------------------------------------- Cardiovascular History & Procedures: Cardiovascular Problems:  Coronary artery disease  Ischemic cardiomyopathy  Risk Factors:  Known coronary artery disease, male gender, and history of tobacco use  Cath/PCI:  LHC/PCI (04/10/16): LMCA normal. LAD with 30% mid vessel disease. D1 with 30% ostial stenosis and patent proximal stent with 20% in-stent restenosis. LCx with 50% proximal/mid disease as well as 40% proximal OM 2 stenosis. Large, dominant RCA with 30% ostial and 90% proximal/mid stenosis with intraluminal thrombus. Successful IVUS-guided PCI to the proximal/mid RCA with placement of a Resolute Integrity 3.0 x 34 mm drug-eluting stent. LVEDP 13 mmHg  CV Surgery:  None  EP Procedures and Devices:  None  Non-Invasive Evaluation(s):  TTE (04/10/16): Normal LV size with LVEF of 30-35% and hypokinesis of the inferior and inferolateral myocardium. Grade 1 diastolic dysfunction. Trivial aortic regurgitation. Mild to moderate mitral regurgitation. Mildly reduced right ventricular contraction. Normal pulmonary artery pressure.  Recent CV Pertinent Labs: Lab Results  Component Value Date   CHOL 183 04/11/2016   CHOL 192 04/14/2011   HDL 34 (L) 04/11/2016   HDL 31 (L) 04/14/2011   LDLCALC 131 (H) 04/11/2016   LDLCALC 134 (H) 04/14/2011   TRIG 90 04/11/2016   TRIG 137 04/14/2011   CHOLHDL 5.4 04/11/2016   INR 0.96 04/10/2016   K 3.8 04/11/2016   K 3.9 09/19/2011   MG 1.8 04/15/2011   BUN 18 04/11/2016   BUN 14 08/29/2015   BUN 14 09/19/2011   CREATININE 0.93 04/11/2016   CREATININE 0.97 09/19/2011    Past medical and surgical history were reviewed and updated in EPIC.  Outpatient Encounter Prescriptions as of 06/06/2016  Medication Sig  . albuterol (PROVENTIL HFA;VENTOLIN HFA) 108 (90 Base) MCG/ACT inhaler Inhale 2 puffs into the lungs every 6 (six) hours as  needed for wheezing or shortness of breath.  Marland Kitchen aspirin EC 81 MG tablet Take 1 tablet (81 mg total) by mouth daily.  Marland Kitchen atorvastatin (LIPITOR) 80 MG tablet Take 1 tablet (80 mg total) by mouth daily at 6 PM.  . carvedilol (COREG) 3.125 MG tablet Take 1 tablet (3.125 mg total) by mouth 2 (two) times daily with a meal.  . fluticasone furoate-vilanterol (BREO ELLIPTA) 200-25 MCG/INH AEPB Inhale 1 puff into the lungs daily.  Marland Kitchen lisinopril (PRINIVIL,ZESTRIL) 5 MG tablet Take 1 tablet (5 mg total) by mouth daily.  . ticagrelor (BRILINTA) 90 MG TABS tablet Take 1 tablet (90 mg total) by mouth 2 (two) times daily.  Marland Kitchen umeclidinium bromide (INCRUSE ELLIPTA) 62.5 MCG/INH AEPB Inhale 1 puff into the lungs daily.   No facility-administered encounter medications on file as of 06/06/2016.     Allergies: Sulfa antibiotics  Social History   Social History  . Marital status: Married    Spouse name: N/A  . Number of children: N/A  . Years of education: N/A   Occupational History  . 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 Smoking status: Former Smoker    Packs/day: 0.50    Years: 30.00    Types: E-cigarettes, Cigarettes    Quit date: 07/2015  . Smokeless tobacco: Never Used     Comment: quit 7/17, uses vap cigarettes now 6 mg nicotine-just dropped to 3 mg " to make heart Dr happy"per pt  . Alcohol use No     Comment: none x 10y  . Drug use: No  . Sexual activity: Not Currently   Other Topics Concern  . Not on file   Social History Narrative   Lives in [place] with his wife    Family History  Problem Relation Age of Onset  . Other Father        "Heart problems" pacemaker  . Prostate cancer Father   . Heart attack Father   . Other Mother        "heart problems"  . Heart attack Mother   . Other Maternal Grandfather        "heart exploded" in his 48s    Review of Systems: A 12-system review of systems was performed and was  negative except as noted in the HPI.  --------------------------------------------------------------------------------------------------  Physical Exam: BP 126/74 (BP Location: Left Arm, Patient Position: Sitting, Cuff Size: Normal)   Pulse (!) 52   Ht 5\' 5"  (1.651 m)   Wt 118 lb 8 oz (53.8 kg)   BMI 19.72 kg/m   General:  Thin, man appearing older than his stated age, seated comfortably in the exam room. HEENT: No conjunctival pallor or scleral icterus.  Moist mucous membranes.  OP clear. Neck: Supple without lymphadenopathy, thyromegaly, JVD, or HJR.  No carotid bruit. Lungs: Normal work of breathing.  Diminished breath sounds throughout without wheezes or crackles. Heart: Regular rate and rhythm without murmurs, rubs, or gallops.  Non-displaced PMI. Abd: Bowel sounds present.  Soft, NT/ND without hepatosplenomegaly Ext: No lower extremity edema.  Radial, PT, and DP pulses are 2+ bilaterally. Skin: warm and dry without rash  EKG:  Sinus bradycardia (heart rate 52 bpm), left anterior fascicular block, with inferior T-wave inversions. No significant change from prior  tracing on 04/24/16.  Lab Results  Component Value Date   WBC 4.6 04/11/2016   HGB 13.7 04/11/2016   HCT 40.2 04/11/2016   MCV 89.7 04/11/2016   PLT 224 04/11/2016    Lab Results  Component Value Date   NA 138 04/11/2016   K 3.8 04/11/2016   CL 109 04/11/2016   CO2 24 04/11/2016   BUN 18 04/11/2016   CREATININE 0.93 04/11/2016   GLUCOSE 88 04/11/2016   ALT 14 (L) 04/10/2016    Lab Results  Component Value Date   CHOL 183 04/11/2016   HDL 34 (L) 04/11/2016   LDLCALC 131 (H) 04/11/2016   TRIG 90 04/11/2016   CHOLHDL 5.4 04/11/2016   --------------------------------------------------------------------------------------------------  ASSESSMENT AND PLAN: Coronary artery disease with atypical angina Patient has had migratory chest pains since his MI in 04/2016. EKG today demonstrates inferior T-wave  changes but overall is not significant change from prior tracings. Catheterization at the time of his high risk NSTEMI showed only mild disease involving the left coronary artery. I think his pain is likely multifactorial, including his lung mass with mediastinal lymphadenopathy as well as potential medication side effects. We have agreed to discontinue ticagrelor in favor of prasugrel; we will use prasugrel instead of clopidogrel given history of acute stent thrombosis in 2012, though it is unclear if Clinton Walsh was compliant with clopidogrel at that time. We will have him load with 30 mg of prasugrel tomorrow, followed by 10 mg daily thereafter. We will continue with low-dose aspirin, atorvastatin, and carvedilol. If his chest pain persists, we may need to repeat left heart catheterization, as Clinton Walsh does not wish to pursue any noninvasive testing as he says it has been falsely negative in the past. However, in the setting of his lung mass, I would like to avoid repeating catheterization if possible.  Ischemic cardiomyopathy with chronic systolic heart failure Patient appears euvolemic with NYHA class II-III symptoms. EF at the time of MI was severely reduced at 30-35%. He is currently on low-dose lisinopril and carvedilol. We will defer changes to his regimen today. We will plan to repeat an echo in about a month to reevaluate his LV function.  Lung mass Clinton Walsh is currently undergoing surveillance with plan for repeat chest CT in the near future. If the mass has enlarged further and tissue diagnosis is needed, we could consider temporary discontinuation of prasugrel 3 months after NSTEMI and stent placement on 04/10/16. He will need to remain on low-dose aspirin and restart prasugrel as soon as possible after the procedure.  Follow-up: Return to clinic in 1 month.  [name], MD 06/07/2016 7:04 AM

## 2016-06-06 NOTE — Patient Instructions (Signed)
Medication Instructions:  Your physician has recommended you make the following change in your medication:  STOP taking brilinta START taking effient. One day one, take 30mg .. Beginning day two, take 10mg  once daily   Labwork: none  Testing/Procedures: none  Follow-Up: Your physician recommends that you schedule a follow-up appointment in: one month with Dr. Saunders Revel   Any Other Special Instructions Will Be Listed Below (If Applicable).     If you need a refill on your cardiac medications before your next appointment, please call your pharmacy.

## 2016-06-07 ENCOUNTER — Encounter: Payer: Self-pay | Admitting: Internal Medicine

## 2016-06-07 DIAGNOSIS — I5022 Chronic systolic (congestive) heart failure: Secondary | ICD-10-CM | POA: Insufficient documentation

## 2016-06-12 ENCOUNTER — Encounter: Payer: Self-pay | Admitting: *Deleted

## 2016-06-12 ENCOUNTER — Telehealth: Payer: Self-pay | Admitting: *Deleted

## 2016-06-12 NOTE — Telephone Encounter (Signed)
Called to check on status of return.  Left message.  

## 2016-06-13 ENCOUNTER — Telehealth: Payer: Self-pay | Admitting: Internal Medicine

## 2016-06-13 NOTE — Telephone Encounter (Signed)
Received records request from [employer] , forwarded to [employer] for processing. ° °

## 2016-06-20 ENCOUNTER — Encounter: Payer: Self-pay | Admitting: *Deleted

## 2016-06-20 DIAGNOSIS — Z955 Presence of coronary angioplasty implant and graft: Secondary | ICD-10-CM

## 2016-06-20 DIAGNOSIS — I2111 ST elevation (STEMI) myocardial infarction involving right coronary artery: Secondary | ICD-10-CM

## 2016-06-20 NOTE — Progress Notes (Signed)
Cardiac Individual Treatment Plan  Patient Details 2Name: Clinton Walsh MRN: [id number] Date of Birth: 06-07-1960 Referring Provider:     Cardiac Rehab from 04/23/2016 in [facility] Cardiac and Pulmonary Rehab  Referring Provider  End, Harrell Gave MD      Initial Encounter Date:    Cardiac Rehab from 04/23/2016 in [facility] Cardiac and Pulmonary Rehab  Date  04/23/16  Referring Provider  End, Harrell Gave MD      Visit Diagnosis: ST elevation myocardial infarction involving right coronary artery [facility])  Status post coronary artery stent placement  Patient's Home Medications on Admission:  Current Outpatient Prescriptions:  .  albuterol (PROVENTIL HFA;VENTOLIN HFA) 108 (90 Base) MCG/ACT inhaler, Inhale 2 puffs into the lungs every 6 (six) hours as needed for wheezing or shortness of breath., Disp: 1 Inhaler, Rfl: 5 .  aspirin EC 81 MG tablet, Take 1 tablet (81 mg total) by mouth daily., Disp: 30 tablet, Rfl: 0 .  atorvastatin (LIPITOR) 80 MG tablet, Take 1 tablet (80 mg total) by mouth daily at 6 PM., Disp: 90 tablet, Rfl: 0 .  carvedilol (COREG) 3.125 MG tablet, Take 1 tablet (3.125 mg total) by mouth 2 (two) times daily with a meal., Disp: 180 tablet, Rfl: 0 .  fluticasone furoate-vilanterol (BREO ELLIPTA) 200-25 MCG/INH AEPB, Inhale 1 puff into the lungs daily., Disp: 60 each, Rfl: 0 .  lisinopril (PRINIVIL,ZESTRIL) 5 MG tablet, Take 1 tablet (5 mg total) by mouth daily., Disp: 90 tablet, Rfl: 0 .  prasugrel (EFFIENT) 10 MG TABS tablet, Take 1 tablet (10 mg total) by mouth daily., Disp: 90 tablet, Rfl: 3 .  umeclidinium bromide (INCRUSE ELLIPTA) 62.5 MCG/INH AEPB, Inhale 1 puff into the lungs daily., Disp: 30 each, Rfl: 0  Past Medical History: Past Medical History:  Diagnosis Date  . CAD (coronary artery disease)    a.  2008 Cath:  nonobstructive plaque, NL LV;  b. 03/2011 NSTEMI/Cath:  100% D1, otw nonobs dzs, EF 45-50%.  D1 stented w/  2.25x18 MiniVision BMS. Presented on 04/14/11  with NSTEMI. Cath showed stent thrmobosis likely due to not taking DAPT.  Treated with thrombectomy and angioplasty.   . Hyperlipidemia   . Myocardial infarct [facility])    april 10th/18-had 3 stent put in.( has had 3 MI total )  . Polymyalgia rheumatica ([place]) 02/2015  . Pulmonary nodules    nonspecific nodules in the right middle lobe  and right lower lobe by CT '08  . Tobacco abuse     Tobacco Use: History  Smoking Status  . Former Smoker  . Packs/day: 0.50  . Years: 30.00  . Types: E-cigarettes, Cigarettes  . Quit date: 07/2015  Smokeless Tobacco  . Never Used    Comment: quit 7/17, uses vap cigarettes now 6 mg nicotine-just dropped to 3 mg " to make heart Dr happy"per pt    Labs: Recent Review Flowsheet Data    Labs for ITP Cardiac and Pulmonary Rehab Latest Ref Rng & Units 10/08/2006 10/09/2006 03/17/2011 04/14/2011 04/11/2016   Cholestrol 0 - 200 mg/dL - 212 ATP III CLASSIFICATION: <200     mg/dL   Desirable 200-239  mg/dL   Borderline High >=240    mg/dL   High(H) 173 192 183   LDLCALC 0 - 99 mg/dL - 146 Total Cholesterol/HDL:CHD Risk Coronary Heart Disease Risk Table Men   Women 1/2 Average Risk   3.4   3.3(H) 118(H) 134(H) 131(H)   HDL >40 mg/dL - 23(L) 38(L) 31(L) 34(L)   Trlycerides <  150 mg/dL - 213(H) 83 137 90   Hemoglobin A1c 4.8 - 5.6 % - - 5.6 - 5.7(H)   HCO3 - 21.9 - - - -   TCO2 - 23 - - - -       Exercise Target Goals:    Exercise Program Goal: Individual exercise prescription set with THRR, safety & activity barriers. Participant demonstrates ability to understand and report RPE using BORG scale, to self-measure pulse accurately, and to acknowledge the importance of the exercise prescription.  Exercise Prescription Goal: Starting with aerobic activity 30 plus minutes a day, 3 days per week for initial exercise prescription. Provide home exercise prescription and guidelines that participant acknowledges understanding prior to discharge.  Activity  Barriers & Risk Stratification:     Activity Barriers & Cardiac Risk Stratification - 04/23/16 1312      Activity Barriers & Cardiac Risk Stratification   Activity Barriers Back Problems;Deconditioning;Muscular Weakness;Shortness of Breath;Joint Problems  joints achy, torn meniscus in both knees, crushed vertebrae in 1985   Cardiac Risk Stratification High      6 Minute Walk:     6 Minute Walk    Row Name 04/23/16 1427         6 Minute Walk   Phase Initial     Distance 990 feet     Walk Time 6 minutes     # of Rest Breaks 0     MPH 1.87     METS 3.4     RPE 10     Perceived Dyspnea  1     VO2 Peak 11.9     Symptoms Yes (comment)     Comments SOB     Resting HR 64 bpm     Resting BP 126/72     Max Ex. HR 78 bpm     Max Ex. BP 126/74     2 Minute Post BP 132/66        Oxygen Initial Assessment:   Oxygen Re-Evaluation:   Oxygen Discharge (Final Oxygen Re-Evaluation):   Initial Exercise Prescription:     Initial Exercise Prescription - 04/23/16 1400      Date of Initial Exercise RX and Referring Provider   Date 04/23/16   Referring Provider End, Harrell Gave MD     Treadmill   MPH 1.8   Grade 0   Minutes 15   METs 2.38     Recumbant Bike   Level 1   RPM 50   Watts 49   Minutes 15   METs 2     NuStep   Level 2   SPM 80   Minutes 15   METs 2     Prescription Details   Frequency (times per week) 2   Duration Progress to 45 minutes of aerobic exercise without signs/symptoms of physical distress     Intensity   THRR 40-80% of Max Heartrate 105-146   Ratings of Perceived Exertion 11-13   Perceived Dyspnea 0-4     Progression   Progression Continue to progress workloads to maintain intensity without signs/symptoms of physical distress.     Resistance Training   Training Prescription Yes   Weight 3 lbs   Reps 10-15      Perform Capillary Blood Glucose checks as needed.  Exercise Prescription Changes:     Exercise Prescription  Changes    Row Name 04/23/16 1400 05/16/16 1000           Response to Exercise   Blood Pressure (Admit)  126/72 122/60      Blood Pressure (Exercise) 126/74 106/54      Blood Pressure (Exit) 132/66 102/60      Heart Rate (Admit) 64 bpm 60 bpm      Heart Rate (Exercise) 78 bpm 80 bpm      Heart Rate (Exit) 74 bpm 74 bpm      Oxygen Saturation (Admit) 99 %  -      Oxygen Saturation (Exercise) 91 %  -      Oxygen Saturation (Exit) 96 %  -      Rating of Perceived Exertion (Exercise) 10 15      Perceived Dyspnea (Exercise) 1  -      Symptoms SOB SOB, chest soreness      Comments walk test results first day      Duration  - Progress to 45 minutes of aerobic exercise without signs/symptoms of physical distress      Intensity  - THRR unchanged        Progression   Progression  - Continue to progress workloads to maintain intensity without signs/symptoms of physical distress.        Recumbant Bike   Level  - 1      Minutes  - 10         Exercise Comments:   Exercise Goals and Review:     Exercise Goals    Row Name 04/23/16 1431             Exercise Goals   Increase Physical Activity Yes       Intervention Provide advice, education, support and counseling about physical activity/exercise needs.;Develop an individualized exercise prescription for aerobic and resistive training based on initial evaluation findings, risk stratification, comorbidities and participant's personal goals.       Expected Outcomes Achievement of increased cardiorespiratory fitness and enhanced flexibility, muscular endurance and strength shown through measurements of functional capacity and personal statement of participant.       Increase Strength and Stamina Yes       Intervention Provide advice, education, support and counseling about physical activity/exercise needs.;Develop an individualized exercise prescription for aerobic and resistive training based on initial evaluation findings, risk  stratification, comorbidities and participant's personal goals.       Expected Outcomes Achievement of increased cardiorespiratory fitness and enhanced flexibility, muscular endurance and strength shown through measurements of functional capacity and personal statement of participant.          Exercise Goals Re-Evaluation :     Exercise Goals Re-Evaluation    Row Name 05/16/16 1042 05/29/16 1445 06/12/16 1621         Exercise Goal Re-Evaluation   Exercise Goals Review Increase Physical Activity;Increase Strenth and Stamina  -  -     Comments Clinton Walsh has completed his first full day of exercise.  He had a hard time moving and breathing due to his pulmonary issues.  We will continue to work with him and finding exercise that works best for him. Out since last review. Out since last review.     Expected Outcomes Short and Long: Attend classes regularly to work on strength and stamina.  -  -        Discharge Exercise Prescription (Final Exercise Prescription Changes):     Exercise Prescription Changes - 05/16/16 1000      Response to Exercise   Blood Pressure (Admit) 122/60   Blood Pressure (Exercise) 106/54   Blood Pressure (Exit) 102/60   Heart  Rate (Admit) 60 bpm   Heart Rate (Exercise) 80 bpm   Heart Rate (Exit) 74 bpm   Rating of Perceived Exertion (Exercise) 15   Symptoms SOB, chest soreness   Comments first day   Duration Progress to 45 minutes of aerobic exercise without signs/symptoms of physical distress   Intensity THRR unchanged     Progression   Progression Continue to progress workloads to maintain intensity without signs/symptoms of physical distress.     Recumbant Bike   Level 1   Minutes 10      Nutrition:  Target Goals: Understanding of nutrition guidelines, daily intake of sodium 1500mg , cholesterol 200mg , calories 30% from fat and 7% or less from saturated fats, daily to have 5 or more servings of fruits and vegetables.  Biometrics:     Pre  Biometrics - 04/23/16 1431      Pre Biometrics   Height 5' 4.75" (1.645 m)   Weight 119 lb 14.4 oz (54.4 kg)   Waist Circumference 31.5 inches   Hip Circumference 33 inches   Waist to Hip Ratio 0.95 %   BMI (Calculated) 20.1   Single Leg Stand 30 seconds       Nutrition Therapy Plan and Nutrition Goals:     Nutrition Therapy & Goals - 04/23/16 1238      Nutrition Therapy   RD appointment defered Yes   Drug/Food Interactions Statins/Certain Fruits      Nutrition Discharge: Rate Your Plate Scores:     Nutrition Assessments - 04/23/16 1243      MEDFICTS Scores   Pre Score 88      Nutrition Goals Re-Evaluation:   Nutrition Goals Discharge (Final Nutrition Goals Re-Evaluation):   Psychosocial: Target Goals: Acknowledge presence or absence of significant depression and/or stress, maximize coping skills, provide positive support system. Participant is able to verbalize types and ability to use techniques and skills needed for reducing stress and depression.   Initial Review & Psychosocial Screening:     Initial Psych Review & Screening - 04/23/16 1239      Initial Review   Current issues with Current Stress Concerns   Source of Stress Concerns Occupation   Comments Clinton Walsh is worried that his test this week may show lung cancer. He has smoked for years he said.      Barriers   Psychosocial barriers to participate in program The patient should benefit from training in stress management and relaxation.     Screening Interventions   Interventions Encouraged to exercise;To provide support and resources with identified psychosocial needs;Program counselor consult      Quality of Life Scores:      Quality of Life - 04/23/16 1240      Quality of Life Scores   Health/Function Pre 16.93 %   Socioeconomic Pre 15.06 %   Psych/Spiritual Pre 24 %   Family Pre 26.4 %   GLOBAL Pre 18.98 %      PHQ-9: Recent Review Flowsheet Data    Depression screen [facility] 2/9  04/23/2016   Decreased Interest 0   Down, Depressed, Hopeless 1   PHQ - 2 Score 1   Altered sleeping 0   Tired, decreased energy 1   Change in appetite 1   Feeling bad or failure about yourself  0   Trouble concentrating 0   Moving slowly or fidgety/restless 0   Suicidal thoughts 0   PHQ-9 Score 3   Difficult doing work/chores Somewhat difficult     Interpretation of Total  Score  Total Score Depression Severity:  1-4 = Minimal depression, 5-9 = Mild depression, 10-14 = Moderate depression, 15-19 = Moderately severe depression, 20-27 = Severe depression   Psychosocial Evaluation and Intervention:   Psychosocial Re-Evaluation:   Psychosocial Discharge (Final Psychosocial Re-Evaluation):   Vocational Rehabilitation: Provide vocational rehab assistance to qualifying candidates.   Vocational Rehab Evaluation & Intervention:     Vocational Rehab - 04/23/16 1236      Initial Vocational Rehab Evaluation & Intervention   Assessment shows need for Vocational Rehabilitation No      Education: Education Goals: Education classes will be provided on a weekly basis, covering required topics. Participant will state understanding/return demonstration of topics presented.  Learning Barriers/Preferences:     Learning Barriers/Preferences - 04/23/16 1234      Learning Barriers/Preferences   Learning Barriers None   Learning Preferences None      Education Topics: General Nutrition Guidelines/Fats and Fiber: -Group instruction provided by verbal, written material, models and posters to present the general guidelines for heart healthy nutrition. Gives an explanation and review of dietary fats and fiber.   Controlling Sodium/Reading Food Labels: -Group verbal and written material supporting the discussion of sodium use in heart healthy nutrition. Review and explanation with models, verbal and written materials for utilization of the food label.   Cardiac Rehab from 05/08/2016 in  [facility] Cardiac and Pulmonary Rehab  Date  05/08/16  Educator  RC  Instruction Review Code  2- meets goals/outcomes      Exercise Physiology & Risk Factors: - Group verbal and written instruction with models to review the exercise physiology of the cardiovascular system and associated critical values. Details cardiovascular disease risk factors and the goals associated with each risk factor.   Aerobic Exercise & Resistance Training: - Gives group verbal and written discussion on the health impact of inactivity. On the components of aerobic and resistive training programs and the benefits of this training and how to safely progress through these programs.   Flexibility, Balance, General Exercise Guidelines: - Provides group verbal and written instruction on the benefits of flexibility and balance training programs. Provides general exercise guidelines with specific guidelines to those with heart or lung disease. Demonstration and skill practice provided.   Stress Management: - Provides group verbal and written instruction about the health risks of elevated stress, cause of high stress, and healthy ways to reduce stress.   Depression: - Provides group verbal and written instruction on the correlation between heart/lung disease and depressed mood, treatment options, and the stigmas associated with seeking treatment.   Anatomy & Physiology of the Heart: - Group verbal and written instruction and models provide basic cardiac anatomy and physiology, with the coronary electrical and arterial systems. Review of: AMI, Angina, Valve disease, Heart Failure, Cardiac Arrhythmia, Pacemakers, and the ICD.   Cardiac Procedures: - Group verbal and written instruction and models to describe the testing methods done to diagnose heart disease. Reviews the outcomes of the test results. Describes the treatment choices: Medical Management, Angioplasty, or Coronary Bypass Surgery.   Cardiac Medications: -  Group verbal and written instruction to review commonly prescribed medications for heart disease. Reviews the medication, class of the drug, and side effects. Includes the steps to properly store meds and maintain the prescription regimen.   Go Sex-Intimacy & Heart Disease, Get SMART - Goal Setting: - Group verbal and written instruction through game format to discuss heart disease and the return to sexual intimacy. Provides group verbal and written  material to discuss and apply goal setting through the application of the S.M.A.R.T. Method.   Other Matters of the Heart: - Provides group verbal, written materials and models to describe Heart Failure, Angina, Valve Disease, and Diabetes in the realm of heart disease. Includes description of the disease process and treatment options available to the cardiac patient.   Exercise & Equipment Safety: - Individual verbal instruction and demonstration of equipment use and safety with use of the equipment.   Cardiac Rehab from 05/08/2016 in [facility] Cardiac and Pulmonary Rehab  Date  04/23/16  Educator  C. Enterkin, RN  Instruction Review Code  1- partially meets, needs review/practice      Infection Prevention: - Provides verbal and written material to individual with discussion of infection control including proper hand washing and proper equipment cleaning during exercise session.   Cardiac Rehab from 05/08/2016 in [facility] Cardiac and Pulmonary Rehab  Date  04/23/16  Educator  C. EnterkinRN  Instruction Review Code  2- meets goals/outcomes      Falls Prevention: - Provides verbal and written material to individual with discussion of falls prevention and safety.   Cardiac Rehab from 05/08/2016 in [facility] Cardiac and Pulmonary Rehab  Date  04/23/16  Educator  C. Rosepine  Instruction Review Code  2- meets goals/outcomes      Diabetes: - Individual verbal and written instruction to review signs/symptoms of diabetes, desired ranges of glucose level  fasting, after meals and with exercise. Advice that pre and post exercise glucose checks will be done for 3 sessions at entry of program.    Knowledge Questionnaire Score:     Knowledge Questionnaire Score - 04/23/16 1235      Knowledge Questionnaire Score   Pre Score 16/28      Core Components/Risk Factors/Patient Goals at Admission:     Personal Goals and Risk Factors at Admission - 04/23/16 1239      Core Components/Risk Factors/Patient Goals on Admission    Weight Management Yes;Weight Maintenance   Intervention Weight Management: Develop a combined nutrition and exercise program designed to reach desired caloric intake, while maintaining appropriate intake of nutrient and fiber, sodium and fats, and appropriate energy expenditure required for the weight goal.;Weight Management: Provide education and appropriate resources to help participant work on and attain dietary goals.   Admit Weight 119 lb 14.4 oz (54.4 kg)   Expected Outcomes Short Term: Continue to assess and modify interventions until short term weight is achieved;Long Term: Adherence to nutrition and physical activity/exercise program aimed toward attainment of established weight goal;Weight Maintenance: Understanding of the daily nutrition guidelines, which includes 25-35% calories from fat, 7% or less cal from saturated fats, less than 200mg  cholesterol, less than 1.5gm of sodium, & 5 or more servings of fruits and vegetables daily   Tobacco Cessation Yes   Intervention Assist the participant in steps to quit. Provide individualized education and counseling about committing to Tobacco Cessation, relapse prevention, and pharmacological support that can be provided by physician.;Advice worker, assist with locating and accessing local/national Quit Smoking programs, and support quit date choice.   Expected Outcomes Short Term: Will demonstrate readiness to quit, by selecting a quit date.;Long Term: Complete  abstinence from all tobacco products for at least 12 months from quit date.   Heart Failure --   Intervention --   Expected Outcomes Improve functional capacity of life;Short term: Attendance in program 2-3 days a week with increased exercise capacity. Reported lower sodium intake. Reported increased fruit and vegetable  intake. Reports medication compliance.   Lipids Yes   Intervention Provide education and support for participant on nutrition & aerobic/resistive exercise along with prescribed medications to achieve LDL 70mg , HDL >40mg .   Expected Outcomes Short Term: Participant states understanding of desired cholesterol values and is compliant with medications prescribed. Participant is following exercise prescription and nutrition guidelines.;Long Term: Cholesterol controlled with medications as prescribed, with individualized exercise RX and with personalized nutrition plan. Value goals: LDL < 70mg , HDL > 40 mg.      Core Components/Risk Factors/Patient Goals Review:    Core Components/Risk Factors/Patient Goals at Discharge (Final Review):    ITP Comments:     ITP Comments    Row Name 04/23/16 1237 04/23/16 1245 04/25/16 0617 05/08/16 1204 05/23/16 0858   ITP Comments ITP Created during Medical Review. NSTEMI diagnosis4/10/2016 [facility] note Treshaun is scheduled for a head to toe PET Scan on 04/25/2016 after his Chest xray and Chest CAT SCAN revealed a lung mass.  30 day review. Continue with ITP unless directed changes per Medical Director review New to program has attended medical review Clinton Walsh did not want to go to the Emerg Dept although he c/o pain right chest area" I have this all the time and this exercise is making it worse. I told the doctor this would not help me." Clinton Walsh asked if we had anything for his breathing. Instructed by our resp therapist about pursed lip breathing and diaghramatic breathing.. Given written info also. Clinton Walsh said he would not  be back to Cardiac Rehab. Clinton Walsh reported that the Lung doctor wants to do a lung biopsy to see what these spos on my lungs are but the heart doctor said if he stops my heart medicine I would probably have a heart attack. Clinton Walsh reports that is has been tough but he quit smoking 9 months ago and has not started back.  30 day review. Continue with ITP unless directed changes per Medical Director review  OUt for medical reasons   Clinton Walsh Name 05/29/16 1445 06/12/16 1621 06/20/16 0536       ITP Comments Called to check on status of return.  Pleas Patricia called out last week with cold and yesterday for same reason.  Left message on machine. Called to check on status of return.  Left message. 30 day review. Continue with ITP unless directed changes per Medical Director review  Last visit 05/08/2016        Comments:

## 2016-06-26 ENCOUNTER — Encounter: Payer: Self-pay | Admitting: *Deleted

## 2016-06-26 DIAGNOSIS — Z955 Presence of coronary angioplasty implant and graft: Secondary | ICD-10-CM

## 2016-06-26 DIAGNOSIS — I2111 ST elevation (STEMI) myocardial infarction involving right coronary artery: Secondary | ICD-10-CM

## 2016-06-26 NOTE — Progress Notes (Signed)
Cardiac Individual Treatment Plan  Patient Details  Name: JOHNCHARLES FUSSELMAN MRN: [id number] Date of Birth: 1960/11/14 Referring Provider:     Cardiac Rehab from 04/23/2016 in [facility] Cardiac and Pulmonary Rehab  Referring Provider  End, Harrell Gave MD      Initial Encounter Date:    Cardiac Rehab from 04/23/2016 in [facility] Cardiac and Pulmonary Rehab  Date  04/23/16  Referring Provider  End, Harrell Gave MD      Visit Diagnosis: ST elevation myocardial infarction involving right coronary artery [facility])  Status post coronary artery stent placement  Patient's Home Medications on Admission:  Current Outpatient Prescriptions:  .  albuterol (PROVENTIL HFA;VENTOLIN HFA) 108 (90 Base) MCG/ACT inhaler, Inhale 2 puffs into the lungs every 6 (six) hours as needed for wheezing or shortness of breath., Disp: 1 Inhaler, Rfl: 5 .  aspirin EC 81 MG tablet, Take 1 tablet (81 mg total) by mouth daily., Disp: 30 tablet, Rfl: 0 .  atorvastatin (LIPITOR) 80 MG tablet, Take 1 tablet (80 mg total) by mouth daily at 6 PM., Disp: 90 tablet, Rfl: 0 .  carvedilol (COREG) 3.125 MG tablet, Take 1 tablet (3.125 mg total) by mouth 2 (two) times daily with a meal., Disp: 180 tablet, Rfl: 0 .  fluticasone furoate-vilanterol (BREO ELLIPTA) 200-25 MCG/INH AEPB, Inhale 1 puff into the lungs daily., Disp: 60 each, Rfl: 0 .  lisinopril (PRINIVIL,ZESTRIL) 5 MG tablet, Take 1 tablet (5 mg total) by mouth daily., Disp: 90 tablet, Rfl: 0 .  prasugrel (EFFIENT) 10 MG TABS tablet, Take 1 tablet (10 mg total) by mouth daily., Disp: 90 tablet, Rfl: 3 .  umeclidinium bromide (INCRUSE ELLIPTA) 62.5 MCG/INH AEPB, Inhale 1 puff into the lungs daily., Disp: 30 each, Rfl: 0  Past Medical History: Past Medical History:  Diagnosis Date  . CAD (coronary artery disease)    a.  2008 Cath:  nonobstructive plaque, NL LV;  b. 03/2011 NSTEMI/Cath:  100% D1, otw nonobs dzs, EF 45-50%.  D1 stented w/  2.25x18 MiniVision BMS. Presented on 04/14/11  with NSTEMI. Cath showed stent thrmobosis likely due to not taking DAPT.  Treated with thrombectomy and angioplasty.   . Hyperlipidemia   . Myocardial infarct [facility])    april 10th/18-had 3 stent put in.( has had 3 MI total )  . Polymyalgia rheumatica ([place]) 02/2015  . Pulmonary nodules    nonspecific nodules in the right middle lobe  and right lower lobe by CT '08  . Tobacco abuse     Tobacco Use: History  Smoking Status  . Former Smoker  . Packs/day: 0.50  . Years: 30.00  . Types: E-cigarettes, Cigarettes  . Quit date: 07/2015  Smokeless Tobacco  . Never Used    Comment: quit 7/17, uses vap cigarettes now 6 mg nicotine-just dropped to 3 mg " to make heart Dr happy"per pt    Labs: Recent Review Flowsheet Data    Labs for ITP Cardiac and Pulmonary Rehab Latest Ref Rng & Units 10/08/2006 10/09/2006 03/17/2011 04/14/2011 04/11/2016   Cholestrol 0 - 200 mg/dL - 212 ATP III CLASSIFICATION: <200     mg/dL   Desirable 200-239  mg/dL   Borderline High >=240    mg/dL   High(H) 173 192 183   LDLCALC 0 - 99 mg/dL - 146 Total Cholesterol/HDL:CHD Risk Coronary Heart Disease Risk Table Men   Women 1/2 Average Risk   3.4   3.3(H) 118(H) 134(H) 131(H)   HDL >40 mg/dL - 23(L) 38(L) 31(L) 34(L)  Trlycerides <150 mg/dL - 213(H) 83 137 90   Hemoglobin A1c 4.8 - 5.6 % - - 5.6 - 5.7(H)   HCO3 - 21.9 - - - -   TCO2 - 23 - - - -       Exercise Target Goals:    Exercise Program Goal: Individual exercise prescription set with THRR, safety & activity barriers. Participant demonstrates ability to understand and report RPE using BORG scale, to self-measure pulse accurately, and to acknowledge the importance of the exercise prescription.  Exercise Prescription Goal: Starting with aerobic activity 30 plus minutes a day, 3 days per week for initial exercise prescription. Provide home exercise prescription and guidelines that participant acknowledges understanding prior to discharge.  Activity  Barriers & Risk Stratification:     Activity Barriers & Cardiac Risk Stratification - 04/23/16 1312      Activity Barriers & Cardiac Risk Stratification   Activity Barriers Back Problems;Deconditioning;Muscular Weakness;Shortness of Breath;Joint Problems  joints achy, torn meniscus in both knees, crushed vertebrae in 1985   Cardiac Risk Stratification High      6 Minute Walk:     6 Minute Walk    Row Name 04/23/16 1427         6 Minute Walk   Phase Initial     Distance 990 feet     Walk Time 6 minutes     # of Rest Breaks 0     MPH 1.87     METS 3.4     RPE 10     Perceived Dyspnea  1     VO2 Peak 11.9     Symptoms Yes (comment)     Comments SOB     Resting HR 64 bpm     Resting BP 126/72     Max Ex. HR 78 bpm     Max Ex. BP 126/74     2 Minute Post BP 132/66        Oxygen Initial Assessment:   Oxygen Re-Evaluation:   Oxygen Discharge (Final Oxygen Re-Evaluation):   Initial Exercise Prescription:     Initial Exercise Prescription - 04/23/16 1400      Date of Initial Exercise RX and Referring Provider   Date 04/23/16   Referring Provider End, Harrell Gave MD     Treadmill   MPH 1.8   Grade 0   Minutes 15   METs 2.38     Recumbant Bike   Level 1   RPM 50   Watts 49   Minutes 15   METs 2     NuStep   Level 2   SPM 80   Minutes 15   METs 2     Prescription Details   Frequency (times per week) 2   Duration Progress to 45 minutes of aerobic exercise without signs/symptoms of physical distress     Intensity   THRR 40-80% of Max Heartrate 105-146   Ratings of Perceived Exertion 11-13   Perceived Dyspnea 0-4     Progression   Progression Continue to progress workloads to maintain intensity without signs/symptoms of physical distress.     Resistance Training   Training Prescription Yes   Weight 3 lbs   Reps 10-15      Perform Capillary Blood Glucose checks as needed.  Exercise Prescription Changes:     Exercise Prescription  Changes    Row Name 04/23/16 1400 05/16/16 1000           Response to Exercise   Blood Pressure (  Admit) 126/72 122/60      Blood Pressure (Exercise) 126/74 106/54      Blood Pressure (Exit) 132/66 102/60      Heart Rate (Admit) 64 bpm 60 bpm      Heart Rate (Exercise) 78 bpm 80 bpm      Heart Rate (Exit) 74 bpm 74 bpm      Oxygen Saturation (Admit) 99 %  -      Oxygen Saturation (Exercise) 91 %  -      Oxygen Saturation (Exit) 96 %  -      Rating of Perceived Exertion (Exercise) 10 15      Perceived Dyspnea (Exercise) 1  -      Symptoms SOB SOB, chest soreness      Comments walk test results first day      Duration  - Progress to 45 minutes of aerobic exercise without signs/symptoms of physical distress      Intensity  - THRR unchanged        Progression   Progression  - Continue to progress workloads to maintain intensity without signs/symptoms of physical distress.        Recumbant Bike   Level  - 1      Minutes  - 10         Exercise Comments:   Exercise Goals and Review:     Exercise Goals    Row Name 04/23/16 1431             Exercise Goals   Increase Physical Activity Yes       Intervention Provide advice, education, support and counseling about physical activity/exercise needs.;Develop an individualized exercise prescription for aerobic and resistive training based on initial evaluation findings, risk stratification, comorbidities and participant's personal goals.       Expected Outcomes Achievement of increased cardiorespiratory fitness and enhanced flexibility, muscular endurance and strength shown through measurements of functional capacity and personal statement of participant.       Increase Strength and Stamina Yes       Intervention Provide advice, education, support and counseling about physical activity/exercise needs.;Develop an individualized exercise prescription for aerobic and resistive training based on initial evaluation findings, risk  stratification, comorbidities and participant's personal goals.       Expected Outcomes Achievement of increased cardiorespiratory fitness and enhanced flexibility, muscular endurance and strength shown through measurements of functional capacity and personal statement of participant.          Exercise Goals Re-Evaluation :     Exercise Goals Re-Evaluation    Row Name 05/16/16 1042 05/29/16 1445 06/12/16 1621         Exercise Goal Re-Evaluation   Exercise Goals Review Increase Physical Activity;Increase Strenth and Stamina  -  -     Comments Brantlee has completed his first full day of exercise.  He had a hard time moving and breathing due to his pulmonary issues.  We will continue to work with him and finding exercise that works best for him. Out since last review. Out since last review.     Expected Outcomes Short and Long: Attend classes regularly to work on strength and stamina.  -  -        Discharge Exercise Prescription (Final Exercise Prescription Changes):     Exercise Prescription Changes - 05/16/16 1000      Response to Exercise   Blood Pressure (Admit) 122/60   Blood Pressure (Exercise) 106/54   Blood Pressure (Exit) 102/60  Heart Rate (Admit) 60 bpm   Heart Rate (Exercise) 80 bpm   Heart Rate (Exit) 74 bpm   Rating of Perceived Exertion (Exercise) 15   Symptoms SOB, chest soreness   Comments first day   Duration Progress to 45 minutes of aerobic exercise without signs/symptoms of physical distress   Intensity THRR unchanged     Progression   Progression Continue to progress workloads to maintain intensity without signs/symptoms of physical distress.     Recumbant Bike   Level 1   Minutes 10      Nutrition:  Target Goals: Understanding of nutrition guidelines, daily intake of sodium 1500mg , cholesterol 200mg , calories 30% from fat and 7% or less from saturated fats, daily to have 5 or more servings of fruits and vegetables.  Biometrics:     Pre  Biometrics - 04/23/16 1431      Pre Biometrics   Height 5' 4.75" (1.645 m)   Weight 119 lb 14.4 oz (54.4 kg)   Waist Circumference 31.5 inches   Hip Circumference 33 inches   Waist to Hip Ratio 0.95 %   BMI (Calculated) 20.1   Single Leg Stand 30 seconds       Nutrition Therapy Plan and Nutrition Goals:     Nutrition Therapy & Goals - 04/23/16 1238      Nutrition Therapy   RD appointment defered Yes   Drug/Food Interactions Statins/Certain Fruits      Nutrition Discharge: Rate Your Plate Scores:     Nutrition Assessments - 04/23/16 1243      MEDFICTS Scores   Pre Score 88      Nutrition Goals Re-Evaluation:   Nutrition Goals Discharge (Final Nutrition Goals Re-Evaluation):   Psychosocial: Target Goals: Acknowledge presence or absence of significant depression and/or stress, maximize coping skills, provide positive support system. Participant is able to verbalize types and ability to use techniques and skills needed for reducing stress and depression.   Initial Review & Psychosocial Screening:     Initial Psych Review & Screening - 04/23/16 1239      Initial Review   Current issues with Current Stress Concerns   Source of Stress Concerns Occupation   Comments Aerion is worried that his test this week may show lung cancer. He has smoked for years he said.      Barriers   Psychosocial barriers to participate in program The patient should benefit from training in stress management and relaxation.     Screening Interventions   Interventions Encouraged to exercise;To provide support and resources with identified psychosocial needs;Program counselor consult      Quality of Life Scores:      Quality of Life - 04/23/16 1240      Quality of Life Scores   Health/Function Pre 16.93 %   Socioeconomic Pre 15.06 %   Psych/Spiritual Pre 24 %   Family Pre 26.4 %   GLOBAL Pre 18.98 %      PHQ-9: Recent Review Flowsheet Data    Depression screen [facility] 2/9  04/23/2016   Decreased Interest 0   Down, Depressed, Hopeless 1   PHQ - 2 Score 1   Altered sleeping 0   Tired, decreased energy 1   Change in appetite 1   Feeling bad or failure about yourself  0   Trouble concentrating 0   Moving slowly or fidgety/restless 0   Suicidal thoughts 0   PHQ-9 Score 3   Difficult doing work/chores Somewhat difficult     Interpretation of  Total Score  Total Score Depression Severity:  1-4 = Minimal depression, 5-9 = Mild depression, 10-14 = Moderate depression, 15-19 = Moderately severe depression, 20-27 = Severe depression   Psychosocial Evaluation and Intervention:   Psychosocial Re-Evaluation:   Psychosocial Discharge (Final Psychosocial Re-Evaluation):   Vocational Rehabilitation: Provide vocational rehab assistance to qualifying candidates.   Vocational Rehab Evaluation & Intervention:     Vocational Rehab - 04/23/16 1236      Initial Vocational Rehab Evaluation & Intervention   Assessment shows need for Vocational Rehabilitation No      Education: Education Goals: Education classes will be provided on a weekly basis, covering required topics. Participant will state understanding/return demonstration of topics presented.  Learning Barriers/Preferences:     Learning Barriers/Preferences - 04/23/16 1234      Learning Barriers/Preferences   Learning Barriers None   Learning Preferences None      Education Topics: General Nutrition Guidelines/Fats and Fiber: -Group instruction provided by verbal, written material, models and posters to present the general guidelines for heart healthy nutrition. Gives an explanation and review of dietary fats and fiber.   Controlling Sodium/Reading Food Labels: -Group verbal and written material supporting the discussion of sodium use in heart healthy nutrition. Review and explanation with models, verbal and written materials for utilization of the food label.   Cardiac Rehab from 05/08/2016 in  [facility] Cardiac and Pulmonary Rehab  Date  05/08/16  Educator  RC  Instruction Review Code  2- meets goals/outcomes      Exercise Physiology & Risk Factors: - Group verbal and written instruction with models to review the exercise physiology of the cardiovascular system and associated critical values. Details cardiovascular disease risk factors and the goals associated with each risk factor.   Aerobic Exercise & Resistance Training: - Gives group verbal and written discussion on the health impact of inactivity. On the components of aerobic and resistive training programs and the benefits of this training and how to safely progress through these programs.   Flexibility, Balance, General Exercise Guidelines: - Provides group verbal and written instruction on the benefits of flexibility and balance training programs. Provides general exercise guidelines with specific guidelines to those with heart or lung disease. Demonstration and skill practice provided.   Stress Management: - Provides group verbal and written instruction about the health risks of elevated stress, cause of high stress, and healthy ways to reduce stress.   Depression: - Provides group verbal and written instruction on the correlation between heart/lung disease and depressed mood, treatment options, and the stigmas associated with seeking treatment.   Anatomy & Physiology of the Heart: - Group verbal and written instruction and models provide basic cardiac anatomy and physiology, with the coronary electrical and arterial systems. Review of: AMI, Angina, Valve disease, Heart Failure, Cardiac Arrhythmia, Pacemakers, and the ICD.   Cardiac Procedures: - Group verbal and written instruction and models to describe the testing methods done to diagnose heart disease. Reviews the outcomes of the test results. Describes the treatment choices: Medical Management, Angioplasty, or Coronary Bypass Surgery.   Cardiac Medications: -  Group verbal and written instruction to review commonly prescribed medications for heart disease. Reviews the medication, class of the drug, and side effects. Includes the steps to properly store meds and maintain the prescription regimen.   Go Sex-Intimacy & Heart Disease, Get SMART - Goal Setting: - Group verbal and written instruction through game format to discuss heart disease and the return to sexual intimacy. Provides group verbal and  written material to discuss and apply goal setting through the application of the S.M.A.R.T. Method.   Other Matters of the Heart: - Provides group verbal, written materials and models to describe Heart Failure, Angina, Valve Disease, and Diabetes in the realm of heart disease. Includes description of the disease process and treatment options available to the cardiac patient.   Exercise & Equipment Safety: - Individual verbal instruction and demonstration of equipment use and safety with use of the equipment.   Cardiac Rehab from 05/08/2016 in [facility] Cardiac and Pulmonary Rehab  Date  04/23/16  Educator  C. Enterkin, RN  Instruction Review Code  1- partially meets, needs review/practice      Infection Prevention: - Provides verbal and written material to individual with discussion of infection control including proper hand washing and proper equipment cleaning during exercise session.   Cardiac Rehab from 05/08/2016 in [facility] Cardiac and Pulmonary Rehab  Date  04/23/16  Educator  C. EnterkinRN  Instruction Review Code  2- meets goals/outcomes      Falls Prevention: - Provides verbal and written material to individual with discussion of falls prevention and safety.   Cardiac Rehab from 05/08/2016 in [facility] Cardiac and Pulmonary Rehab  Date  04/23/16  Educator  C. Homeworth  Instruction Review Code  2- meets goals/outcomes      Diabetes: - Individual verbal and written instruction to review signs/symptoms of diabetes, desired ranges of glucose level  fasting, after meals and with exercise. Advice that pre and post exercise glucose checks will be done for 3 sessions at entry of program.    Knowledge Questionnaire Score:     Knowledge Questionnaire Score - 04/23/16 1235      Knowledge Questionnaire Score   Pre Score 16/28      Core Components/Risk Factors/Patient Goals at Admission:     Personal Goals and Risk Factors at Admission - 04/23/16 1239      Core Components/Risk Factors/Patient Goals on Admission    Weight Management Yes;Weight Maintenance   Intervention Weight Management: Develop a combined nutrition and exercise program designed to reach desired caloric intake, while maintaining appropriate intake of nutrient and fiber, sodium and fats, and appropriate energy expenditure required for the weight goal.;Weight Management: Provide education and appropriate resources to help participant work on and attain dietary goals.   Admit Weight 119 lb 14.4 oz (54.4 kg)   Expected Outcomes Short Term: Continue to assess and modify interventions until short term weight is achieved;Long Term: Adherence to nutrition and physical activity/exercise program aimed toward attainment of established weight goal;Weight Maintenance: Understanding of the daily nutrition guidelines, which includes 25-35% calories from fat, 7% or less cal from saturated fats, less than 200mg  cholesterol, less than 1.5gm of sodium, & 5 or more servings of fruits and vegetables daily   Tobacco Cessation Yes   Intervention Assist the participant in steps to quit. Provide individualized education and counseling about committing to Tobacco Cessation, relapse prevention, and pharmacological support that can be provided by physician.;Advice worker, assist with locating and accessing local/national Quit Smoking programs, and support quit date choice.   Expected Outcomes Short Term: Will demonstrate readiness to quit, by selecting a quit date.;Long Term: Complete  abstinence from all tobacco products for at least 12 months from quit date.   Heart Failure --   Intervention --   Expected Outcomes Improve functional capacity of life;Short term: Attendance in program 2-3 days a week with increased exercise capacity. Reported lower sodium intake. Reported increased fruit and  vegetable intake. Reports medication compliance.   Lipids Yes   Intervention Provide education and support for participant on nutrition & aerobic/resistive exercise along with prescribed medications to achieve LDL 70mg , HDL >40mg .   Expected Outcomes Short Term: Participant states understanding of desired cholesterol values and is compliant with medications prescribed. Participant is following exercise prescription and nutrition guidelines.;Long Term: Cholesterol controlled with medications as prescribed, with individualized exercise RX and with personalized nutrition plan. Value goals: LDL < 70mg , HDL > 40 mg.      Core Components/Risk Factors/Patient Goals Review:    Core Components/Risk Factors/Patient Goals at Discharge (Final Review):    ITP Comments:     ITP Comments    Row Name 04/23/16 1237 04/23/16 1245 04/25/16 0617 05/08/16 1204 05/23/16 0858   ITP Comments ITP Created during Medical Review. NSTEMI diagnosis4/10/2016 [facility] note Edyn is scheduled for a head to toe PET Scan on 04/25/2016 after his Chest xray and Chest CAT SCAN revealed a lung mass.  30 day review. Continue with ITP unless directed changes per Medical Director review New to program has attended medical review Peter did not want to go to the Emerg Dept although he c/o pain right chest area" I have this all the time and this exercise is making it worse. I told the doctor this would not help me." Jamesyn asked if we had anything for his breathing. Instructed by our resp therapist about pursed lip breathing and diaghramatic breathing.. Given written info also. Cylus said he would not  be back to Cardiac Rehab. Symon reported that the Lung doctor wants to do a lung biopsy to see what these spos on my lungs are but the heart doctor said if he stops my heart medicine I would probably have a heart attack. Florencio reports that is has been tough but he quit smoking 9 months ago and has not started back.  30 day review. Continue with ITP unless directed changes per Medical Director review  OUt for medical reasons   [place] Name 05/29/16 1445 06/12/16 1621 06/20/16 0536 06/26/16 1347     ITP Comments Called to check on status of return.  Pleas Patricia called out last week with cold and yesterday for same reason.  Left message on machine. Called to check on status of return.  Left message. 30 day review. Continue with ITP unless directed changes per Medical Director review  Last visit 05/08/2016 Sebastyan will be discharged today.  On his last visit on 5/8, he expressed that he would not be coming back to exercise.  He has not returned any of our attempted contacts and will be sent a discharge letter.       Comments: Discharge ITP

## 2016-06-26 NOTE — Progress Notes (Signed)
Discharge Summary  Patient Details  Name: Clinton Walsh MRN: [id number] Date of Birth: 1960/03/05 Referring Provider:     Cardiac Rehab from 04/23/2016 in [facility]  Referring Provider  End, Harrell Gave MD       Number of Visits: 3/36  Reason for Discharge:  Early Exit:  Personal  Smoking History:  History  Smoking Status  . Former Smoker  . Packs/day: 0.50  . Years: 30.00  . Types: E-cigarettes, Cigarettes  . Quit date: 07/2015  Smokeless Tobacco  . Never Used    Comment: quit 7/17, uses vap cigarettes now 6 mg nicotine-just dropped to 3 mg " to make heart Dr happy"per pt    Diagnosis:  ST elevation myocardial infarction involving right coronary artery ([place])  Status post coronary artery stent placement  ADL [facility]:   Initial Exercise Prescription:     Initial Exercise Prescription - 04/23/16 1400      Date of Initial Exercise RX and Referring Provider   Date 04/23/16   Referring Provider End, Harrell Gave MD     Treadmill   MPH 1.8   Grade 0   Minutes 15   METs 2.38     Recumbant Bike   Level 1   RPM 50   Watts 49   Minutes 15   METs 2     NuStep   Level 2   SPM 80   Minutes 15   METs 2     Prescription Details   Frequency (times per week) 2   Duration Progress to 45 minutes of aerobic exercise without signs/symptoms of physical distress     Intensity   THRR 40-80% of Max Heartrate 105-146   Ratings of Perceived Exertion 11-13   Perceived Dyspnea 0-4     Progression   Progression Continue to progress workloads to maintain intensity without signs/symptoms of physical distress.     Resistance Training   Training Prescription Yes   Weight 3 lbs   Reps 10-15      Discharge Exercise Prescription (Final Exercise Prescription Changes):     Exercise Prescription Changes - 05/16/16 1000      Response to Exercise   Blood Pressure (Admit) 122/60   Blood Pressure (Exercise) 106/54   Blood Pressure (Exit) 102/60    Heart Rate (Admit) 60 bpm   Heart Rate (Exercise) 80 bpm   Heart Rate (Exit) 74 bpm   Rating of Perceived Exertion (Exercise) 15   Symptoms SOB, chest soreness   Comments first day   Duration Progress to 45 minutes of aerobic exercise without signs/symptoms of physical distress   Intensity THRR unchanged     Progression   Progression Continue to progress workloads to maintain intensity without signs/symptoms of physical distress.     Recumbant Bike   Level 1   Minutes 10      Functional Capacity:     6 Minute Walk    Row Name 04/23/16 1427         6 Minute Walk   Phase Initial     Distance 990 feet     Walk Time 6 minutes     # of Rest Breaks 0     MPH 1.87     METS 3.4     RPE 10     Perceived Dyspnea  1     VO2 Peak 11.9     Symptoms Yes (comment)     Comments SOB     Resting HR 64 bpm  Resting BP 126/72     Max Ex. HR 78 bpm     Max Ex. BP 126/74     2 Minute Post BP 132/66        Psychological, QOL, Others - Outcomes: PHQ 2/9: Depression screen PHQ 2/9 04/23/2016  Decreased Interest 0  Down, Depressed, Hopeless 1  PHQ - 2 Score 1  Altered sleeping 0  Tired, decreased energy 1  Change in appetite 1  Feeling bad or failure about yourself  0  Trouble concentrating 0  Moving slowly or fidgety/restless 0  Suicidal thoughts 0  PHQ-9 Score 3  Difficult doing work/chores Somewhat difficult    Quality of Life:     Quality of Life - 04/23/16 1240      Quality of Life Scores   Health/Function Pre 16.93 %   Socioeconomic Pre 15.06 %   Psych/Spiritual Pre 24 %   Family Pre 26.4 %   GLOBAL Pre 18.98 %      Personal Goals: Goals established at orientation with interventions provided to work toward goal.     Personal Goals and Risk Factors at Admission - 04/23/16 1239      Core Components/Risk Factors/Patient Goals on Admission    Weight Management Yes;Weight Maintenance   Intervention Weight Management: Develop a combined nutrition and  exercise program designed to reach desired caloric intake, while maintaining appropriate intake of nutrient and fiber, sodium and fats, and appropriate energy expenditure required for the weight goal.;Weight Management: Provide education and appropriate resources to help participant work on and attain dietary goals.   Admit Weight 119 lb 14.4 oz (54.4 kg)   Expected Outcomes Short Term: Continue to assess and modify interventions until short term weight is achieved;Long Term: Adherence to nutrition and physical activity/exercise program aimed toward attainment of established weight goal;Weight Maintenance: Understanding of the daily nutrition guidelines, which includes 25-35% calories from fat, 7% or less cal from saturated fats, less than 200mg  cholesterol, less than 1.5gm of sodium, & 5 or more servings of fruits and vegetables daily   Tobacco Cessation Yes   Intervention Assist the participant in steps to quit. Provide individualized education and counseling about committing to Tobacco Cessation, relapse prevention, and pharmacological support that can be provided by physician.;Advice worker, assist with locating and accessing local/national Quit Smoking programs, and support quit date choice.   Expected Outcomes Short Term: Will demonstrate readiness to quit, by selecting a quit date.;Long Term: Complete abstinence from all tobacco products for at least 12 months from quit date.   Heart Failure --   Intervention --   Expected Outcomes Improve functional capacity of life;Short term: Attendance in program 2-3 days a week with increased exercise capacity. Reported lower sodium intake. Reported increased fruit and vegetable intake. Reports medication compliance.   Lipids Yes   Intervention Provide education and support for participant on nutrition & aerobic/resistive exercise along with prescribed medications to achieve LDL 70mg , HDL >40mg .   Expected Outcomes Short Term: Participant  states understanding of desired cholesterol values and is compliant with medications prescribed. Participant is following exercise prescription and nutrition guidelines.;Long Term: Cholesterol controlled with medications as prescribed, with individualized exercise RX and with personalized nutrition plan. Value goals: LDL < 70mg , HDL > 40 mg.       Personal Goals Discharge:   Nutrition & Weight - Outcomes:     Pre Biometrics - 04/23/16 1431      Pre Biometrics   Height 5' 4.75" (1.645 m)   Weight  119 lb 14.4 oz (54.4 kg)   Waist Circumference 31.5 inches   Hip Circumference 33 inches   Waist to Hip Ratio 0.95 %   BMI (Calculated) 20.1   Single Leg Stand 30 seconds       Nutrition:     Nutrition Therapy & Goals - 04/23/16 1238      Nutrition Therapy   RD appointment defered Yes   Drug/Food Interactions Statins/Certain Fruits      Nutrition Discharge:     Nutrition Assessments - 04/23/16 1243      MEDFICTS Scores   Pre Score 88      Education Questionnaire Score:     Knowledge Questionnaire Score - 04/23/16 1235      Knowledge Questionnaire Score   Pre Score 16/28      Goals reviewed with patient; copy given to patient.

## 2016-06-27 ENCOUNTER — Other Ambulatory Visit
Admission: RE | Admit: 2016-06-27 | Discharge: 2016-06-27 | Disposition: A | Discharge status: Home / Self Care | Payer: Managed Care, Other (non HMO) | Source: Ambulatory Visit | Attending: Internal Medicine | Admitting: Internal Medicine

## 2016-06-27 ENCOUNTER — Ambulatory Visit (INDEPENDENT_AMBULATORY_CARE_PROVIDER_SITE_OTHER): Payer: Managed Care, Other (non HMO) | Admitting: Internal Medicine

## 2016-06-27 ENCOUNTER — Encounter: Payer: Self-pay | Admitting: Internal Medicine

## 2016-06-27 VITALS — BP 98/78 | HR 57 | Ht 65.0 in | Wt 115.8 lb

## 2016-06-27 DIAGNOSIS — R918 Other nonspecific abnormal finding of lung field: Secondary | ICD-10-CM | POA: Diagnosis not present

## 2016-06-27 DIAGNOSIS — Z01810 Encounter for preprocedural cardiovascular examination: Secondary | ICD-10-CM | POA: Diagnosis present

## 2016-06-27 DIAGNOSIS — I255 Ischemic cardiomyopathy: Secondary | ICD-10-CM | POA: Diagnosis not present

## 2016-06-27 DIAGNOSIS — R079 Chest pain, unspecified: Secondary | ICD-10-CM | POA: Insufficient documentation

## 2016-06-27 DIAGNOSIS — I25118 Atherosclerotic heart disease of native coronary artery with other forms of angina pectoris: Secondary | ICD-10-CM

## 2016-06-27 DIAGNOSIS — I5022 Chronic systolic (congestive) heart failure: Secondary | ICD-10-CM | POA: Diagnosis not present

## 2016-06-27 LAB — CBC WITH DIFFERENTIAL/PLATELET
BASOS ABS: 0 10*3/uL (ref 0–0.1)
Basophils Relative: 1 %
Eosinophils Absolute: 0.1 10*3/uL (ref 0–0.7)
Eosinophils Relative: 3 %
HEMATOCRIT: 41.6 % (ref 40.0–52.0)
Hemoglobin: 14 g/dL (ref 13.0–18.0)
LYMPHS PCT: 19 %
Lymphs Abs: 0.8 10*3/uL — ABNORMAL LOW (ref 1.0–3.6)
MCH: 29.9 pg (ref 26.0–34.0)
MCHC: 33.7 g/dL (ref 32.0–36.0)
MCV: 88.5 fL (ref 80.0–100.0)
MONO ABS: 0.4 10*3/uL (ref 0.2–1.0)
MONOS PCT: 9 %
NEUTROS ABS: 3 10*3/uL (ref 1.4–6.5)
Neutrophils Relative %: 68 %
Platelets: 322 10*3/uL (ref 150–440)
RBC: 4.7 MIL/uL (ref 4.40–5.90)
RDW: 13.8 % (ref 11.5–14.5)
WBC: 4.4 10*3/uL (ref 3.8–10.6)

## 2016-06-27 LAB — BASIC METABOLIC PANEL
ANION GAP: 6 (ref 5–15)
BUN: 18 mg/dL (ref 6–20)
CO2: 27 mmol/L (ref 22–32)
Calcium: 8.7 mg/dL — ABNORMAL LOW (ref 8.9–10.3)
Chloride: 102 mmol/L (ref 101–111)
Creatinine, Ser: 1 mg/dL (ref 0.61–1.24)
GFR calc Af Amer: >60 mL/min (ref >60)
GLUCOSE: 93 mg/dL (ref 65–99)
POTASSIUM: 3.7 mmol/L (ref 3.5–5.1)
Sodium: 135 mmol/L (ref 135–145)

## 2016-06-27 LAB — PROTIME-INR
INR: 1
Prothrombin Time: 13.2 seconds (ref 11.4–15.2)

## 2016-06-27 LAB — FIBRIN DERIVATIVES D-DIMER (ARMC ONLY): FIBRIN DERIVATIVES D-DIMER (ARMC): 1486.61 — AB (ref 0.00–499.00)

## 2016-06-27 MED ORDER — RANOLAZINE ER 500 MG PO TB12: 500.0000 mg | ORAL_TABLET | Freq: Two times a day (BID) | ORAL | 3 refills | Status: DC

## 2016-06-27 NOTE — Progress Notes (Signed)
Follow-up Outpatient Visit Date: 06/27/2016  Primary Care Provider: Glean Hess, MD [address] [place] [address]  Chief Complaint: Chest pain  HPI:  Clinton Walsh is a 57 y.o. 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 I last saw him about 3 weeks ago, at which time we switched him from ticagrelor to prasugrel due to significant shortness of breath. Though his breathing has improved significantly, he has noted increased frequency of bilateral chest pain. He describes a sharp, stabbing sensation that can occur at any time and is reminiscent of what he has experienced in the past with his MIs. The symptoms are not exertional or positional. They typically last 3-4 minutes and sometimes can be 10/10 in intensity. He does not have any associated symptoms other than the sensation of a "all of cotton" in the right side of his chest. He also notes increased cough over the last few days. He becomes dizzy at times after coughing but otherwise denies lightheadedness. He has not had any orthopnea, PND, or palpitations. [name] endorses occasional bilateral leg swelling, which seems dependent.  -------------------------------------------------------------------------------------------------- Cardiovascular History & Procedures: Cardiovascular Problems:  Coronary artery disease  Ischemic cardiomyopathy  Risk Factors:  Known coronary artery disease, male gender, and history of tobacco use  Cath/PCI:  LHC/PCI (04/10/16): LMCA normal. LAD with 30% mid vessel disease. D1 with 30% ostial stenosis and patent proximal stent with 20% in-stent restenosis. LCx with 50%  proximal/mid disease as well as 40% proximal OM 2 stenosis. Large, dominant RCA with 30% ostial and 90% proximal/mid stenosis with intraluminal thrombus. Successful IVUS-guided PCI to the proximal/mid RCA with placement of a Resolute Integrity 3.0 x 34 mm drug-eluting stent. LVEDP 13 mmHg  CV Surgery:  None  EP Procedures and Devices:  None  Non-Invasive Evaluation(s):  TTE (04/10/16): Normal LV size with LVEF of 30-35% and hypokinesis of the inferior and inferolateral myocardium. Grade 1 diastolic dysfunction. Trivial aortic regurgitation. Mild to moderate mitral regurgitation. Mildly reduced right ventricular contraction. Normal pulmonary artery pressure.  Recent CV Pertinent Labs: Lab Results  Component Value Date   CHOL 183 04/11/2016   CHOL 192 04/14/2011   HDL 34 (L) 04/11/2016   HDL 31 (L) 04/14/2011   LDLCALC 131 (H) 04/11/2016   LDLCALC 134 (H) 04/14/2011   TRIG 90 04/11/2016   TRIG 137 04/14/2011   CHOLHDL 5.4 04/11/2016   INR 0.96 04/10/2016   K 3.8 04/11/2016   K 3.9 09/19/2011   MG 1.8 04/15/2011   BUN 18 04/11/2016   BUN 14 08/29/2015   BUN 14 09/19/2011   CREATININE 0.93 04/11/2016   CREATININE 0.97 09/19/2011    Past medical and surgical history were reviewed and updated in EPIC.  Outpatient Encounter Prescriptions as of 06/27/2016  Medication Sig  . albuterol (PROVENTIL HFA;VENTOLIN HFA) 108 (90 Base) MCG/ACT inhaler Inhale 2 puffs into the lungs every 6 (six) hours as needed for wheezing or shortness of breath.  Marland Kitchen aspirin EC 81 MG tablet Take 1 tablet (81 mg total) by mouth daily.  Marland Kitchen atorvastatin (LIPITOR) 80 MG tablet Take 1 tablet (80 mg total) by mouth daily at 6 PM.  . carvedilol (COREG) 3.125 MG tablet Take 1 tablet (3.125 mg  total) by mouth 2 (two) times daily with a meal.  . fluticasone furoate-vilanterol (BREO ELLIPTA) 200-25 MCG/INH AEPB Inhale 1 puff into the lungs daily.  Marland Kitchen lisinopril (PRINIVIL,ZESTRIL) 5 MG tablet Take 1 tablet (5 mg total)  by mouth daily.  . prasugrel (EFFIENT) 10 MG TABS tablet Take 1 tablet (10 mg total) by mouth daily.  Marland Kitchen umeclidinium bromide (INCRUSE ELLIPTA) 62.5 MCG/INH AEPB Inhale 1 puff into the lungs daily.   No facility-administered encounter medications on file as of 06/27/2016.     Allergies: Sulfa antibiotics  Social History   Social History  . Marital status: Married    Spouse name: N/A  . Number of children: N/A  . Years of education: N/A   Occupational History  . 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 Smoking status: Former Smoker    Packs/day: 0.50    Years: 30.00    Types: E-cigarettes, Cigarettes    Quit date: 07/2015  . Smokeless tobacco: Never Used     Comment: quit 7/17, uses vap cigarettes now 6 mg nicotine-just dropped to 3 mg " to make heart Dr happy"per pt  . Alcohol use No     Comment: none x 10y  . Drug use: No  . Sexual activity: Not Currently   Other Topics Concern  . Not on file   Social History Narrative   Lives in [place] with his wife    Family History  Problem Relation Age of Onset  . Other Father        "Heart problems" pacemaker  . Prostate cancer Father   . Heart attack Father   . Other Mother        "heart problems"  . Heart attack Mother   . Other Maternal Grandfather        "heart exploded" in his 72s    Review of Systems: A 12-system review of systems was performed and was negative except as noted in the HPI.  --------------------------------------------------------------------------------------------------  Physical Exam: BP 98/78 (BP Location: Left Arm, Patient Position: Sitting, Cuff Size: Normal)   Pulse (!) 57   Ht 5\' 5"  (1.651 m)   Wt 115 lb 12 oz (52.5 kg)   BMI 19.26 kg/m   General:  Thin, chronically ill-appearing man, seated comfortably in the exam room. HEENT: No conjunctival pallor or scleral icterus.  Moist mucous membranes.  OP clear. Neck: Supple  without lymphadenopathy, thyromegaly, JVD, or HJR.  No carotid bruit. Lungs: Normal work of breathing.  Clear to auscultation bilaterally without wheezes or crackles. Heart: Regular rate and rhythm without murmurs, rubs, or gallops.  Non-displaced PMI. Abd: Bowel sounds present.  Soft, NT/ND without hepatosplenomegaly Ext: No lower extremity edema.  Radial, PT, and DP pulses are 2+ bilaterally. Skin: Warm and dry without rash.  EKG:  Sinus rhythm with left anterior fascicular block. Previously noted T-wave inversions are no longer evident.  Lab Results  Component Value Date   WBC 4.6 04/11/2016   HGB 13.7 04/11/2016   HCT 40.2 04/11/2016   MCV 89.7 04/11/2016   PLT 224 04/11/2016    Lab Results  Component Value Date   NA 138 04/11/2016   K 3.8 04/11/2016   CL 109 04/11/2016   CO2 24 04/11/2016   BUN 18 04/11/2016   CREATININE 0.93 04/11/2016   GLUCOSE 88 04/11/2016   ALT 14 (L) 04/10/2016    Lab Results  Component  Value Date   CHOL 183 04/11/2016   HDL 34 (L) 04/11/2016   LDLCALC 131 (H) 04/11/2016   TRIG 90 04/11/2016   CHOLHDL 5.4 04/11/2016   --------------------------------------------------------------------------------------------------  ASSESSMENT AND PLAN: Coronary artery disease with atypical angina Mr. Winkels continues to have frequent bouts of sharp chest pain, somewhat increased in frequency from her last visit. He is concerned that this reflects worsening CAD, as the pain is similar to what he experienced at the time of his MIs in 2012 and earlier this year. Of note, cardiac catheterization at the time of his inferior STEMI showed only mild left coronary artery disease. Post PCI, there was no significant disease involving the RCA. We discussed performing a myocardial perfusion stress test, Mr. Dematteo is hesitant to proceed with a stress test due to "false negatives" in the past. We have therefore agreed to perform repeat left heart catheterization to  ensure that he does not have any high-grade disease to account for his chest pain. I have reviewed the risks, indications, and alternatives to cardiac catheterization, possible angioplasty, and stenting with the patient. Risks include but are not limited to bleeding, infection, vascular injury, stroke, myocardial infection, arrhythmia, kidney injury, radiation-related injury in the case of prolonged fluoroscopy use, emergency cardiac surgery, and death. The patient understands the risks of serious complication is 1-2 in 2585 with diagnostic cardiac cath and 1-2% or less with angioplasty/stenting. It is certainly possible that his chest pain could be due to his underlying lung disease, including lung mass noted at the time of his MI in April. In the setting of likely malignancy, DVT PE is also A consideration. I will check a d-dimer as part of his labs today for the upcoming catheterization. If the d-dimer is elevated, we will need to consider CT chest to exclude PE. We will add ranolazine 500 mg twice a day for anti-anginal therapy pending catheterization.  Ischemic cardiomyopathy with chronic systolic heart failure Mr. Piper again appears euvolemic with NYHA class II-III heart failure symptoms. His dyspnea has improved since switching from ticagrelor to present her old. However, he continues to have significant weakness that is likely multifactorial. We will not make any medication changes today. We will repeat an echocardiogram in about one month.  Lung mass The patient continues to undergo observation with Dr. Mortimer Fries. Follow-up is scheduled for next month.  Follow-up: To be determined based on cardiac catheterization  [name], MD 06/27/2016 2:43 PM

## 2016-06-27 NOTE — Patient Instructions (Addendum)
Medication Instructions:  Your physician has recommended you make the following change in your medication:  1- START TAKING Ranexa 500 mg (1 tablet) by mouth twice a day.   Labwork: Your physician recommends that you return for lab work in: TODAY (D-dimer, CBC, BMP, PT/INR), - Please go to the [facility]. You will check in at the front desk to the right as you walk into the atrium. Valet Parking is offered if needed.     Testing/Procedures: Your physician has requested that you have a LEFT cardiac catheterization. Cardiac catheterization is used to diagnose and/or treat various heart conditions. Doctors may recommend this procedure for a number of different reasons. The most common reason is to evaluate chest pain. Chest pain can be a symptom of coronary artery disease (CAD), and cardiac catheterization can show whether plaque is narrowing or blocking your heart's arteries. This procedure is also used to evaluate the valves, as well as measure the blood flow and oxygen levels in different parts of your heart. For further information please visit [email or web address]. Please follow instruction sheet, as given.  [facility] Cardiac Cath Instructions   You are scheduled for a Cardiac Cath on:____07/03/18_____  Please arrive at _08:30__am on the day of your procedure  Please expect a call from our [facility] to pre-register you  Do not eat/drink anything after midnight  Someone will need to drive you home  It is recommended someone be with you for the first 24 hours after your procedure  Wear clothes that are easy to get on/off and wear slip on shoes if possible   Medications bring a current list of all medications with you  _X_ You may take your medications EXCEPT THE LISINOPRIL the morning of your procedure with enough water to swallow safely  _X_ Do not take these medications before your procedure:_____LISINOPRIL____   Day of your procedure: Arrive at the  [place] entrance.  Free valet service is available.  After entering the [place] please check-in at the registration desk (1st desk on your right) to receive your armband. After receiving your armband someone will escort you to the cardiac cath/special procedures waiting area.  The usual length of stay after your procedure is about 2 to 3 hours.  This can vary.  If you have any questions, please call our office at [phone], or you may call the cardiac cath lab at [facility] directly at [phone]   Follow-Up: Your physician recommends that you schedule a follow-up appointment in: to be determined after procedure.   If you need a refill on your cardiac medications before your next appointment, please call your pharmacy.    Coronary Angiogram With Stent Coronary angiogram with stent placement is a procedure to widen or open a narrow blood vessel of the heart (coronary artery). Arteries may become blocked by cholesterol buildup (plaques) in the lining or wall. When a coronary artery becomes partially blocked, blood flow to that area decreases. This may lead to chest pain or a heart attack (myocardial infarction). A stent is a small piece of metal that looks like mesh or a spring. Stent placement may be done as treatment for a heart attack or right after a coronary angiogram in which a blocked artery is found. Let your health care provider know about:  Any allergies you have.  All medicines you are taking, including vitamins, herbs, eye drops, creams, and over-the-counter medicines.  Any problems you or family members have had with anesthetic medicines.  Any blood  disorders you have.  Any surgeries you have had.  Any medical conditions you have.  Whether you are pregnant or may be pregnant. What are the risks? Generally, this is a safe procedure. However, problems may occur, including:  Damage to the heart or its blood vessels.  A return of blockage.  Bleeding, infection,  or bruising at the insertion site.  A collection of blood under the skin (hematoma) at the insertion site.  A blood clot in another part of the body.  Kidney injury.  Allergic reaction to the dye or contrast that is used.  Bleeding into the abdomen (retroperitoneal bleeding).  What happens before the procedure? Staying hydrated Follow instructions from your health care provider about hydration, which may include:  Up to 2 hours before the procedure - you may continue to drink clear liquids, such as water, clear fruit juice, black coffee, and plain tea.  Eating and drinking restrictions Follow instructions from your health care provider about eating and drinking, which may include:  8 hours before the procedure - stop eating heavy meals or foods such as meat, fried foods, or fatty foods.  6 hours before the procedure - stop eating light meals or foods, such as toast or cereal.  2 hours before the procedure - stop drinking clear liquids.  Ask your health care provider about:  Changing or stopping your regular medicines. This is especially important if you are taking diabetes medicines or blood thinners.  Taking medicines such as ibuprofen. These medicines can thin your blood. Do not take these medicines before your procedure if your health care provider instructs you not to. Generally, aspirin is recommended before a procedure of passing a small, thin tube (catheter) through a blood vessel and into the heart (cardiac catheterization).  What happens during the procedure?  An IV tube will be inserted into one of your veins.  You will be given one or more of the following: ? A medicine to help you relax (sedative). ? A medicine to numb the area where the catheter will be inserted into an artery (local anesthetic).  To reduce your risk of infection: ? Your health care team will wash or sanitize their hands. ? Your skin will be washed with soap. ? Hair may be removed from the  area where the catheter will be inserted.  Using a guide wire, the catheter will be inserted into an artery. The location may be in your groin, in your wrist, or in the fold of your arm (near your elbow).  A type of X-ray (fluoroscopy) will be used to help guide the catheter to the opening of the arteries in the heart.  A dye will be injected into the catheter, and X-rays will be taken. The dye will help to show where any narrowing or blockages are located in the arteries.  A tiny wire will be guided to the blocked spot, and a balloon will be inflated to make the artery wider.  The stent will be expanded and will crush the plaques into the wall of the vessel. The stent will hold the area open and improve the blood flow. Most stents have a drug coating to reduce the risk of the stent narrowing over time.  The artery may be made wider using a drill, laser, or other tools to remove plaques.  When the blood flow is better, the catheter will be removed. The lining of the artery will grow over the stent, which stays where it was placed. This procedure may  vary among health care providers and hospitals. What happens after the procedure?  If the procedure is done through the leg, you will be kept in bed lying flat for about 6 hours. You will be instructed to not bend and not cross your legs.  The insertion site will be checked frequently.  The pulse in your foot or wrist will be checked frequently.  You may have additional blood tests, X-rays, and a test that records the electrical activity of your heart (electrocardiogram, or ECG). This information is not intended to replace advice given to you by your health care provider. Make sure you discuss any questions you have with your health care provider. Document Released: 06/24/2002 Document Revised: 08/18/2015 Document Reviewed: 07/24/2015 Elsevier Interactive Patient Education  2017 [employer].    Medication Samples have been provided to the  patient.  Drug name: Ranexa       Strength: 500 mg        Qty: 4 boxes (28 tablets)  LOT: [id number]  Exp.Date: 09/2018

## 2016-06-28 ENCOUNTER — Telehealth: Payer: Self-pay | Admitting: *Deleted

## 2016-06-28 ENCOUNTER — Ambulatory Visit
Admission: RE | Admit: 2016-06-28 | Discharge: 2016-06-28 | Disposition: A | Discharge status: Home / Self Care | Payer: Managed Care, Other (non HMO) | Source: Ambulatory Visit | Attending: Internal Medicine | Admitting: Internal Medicine

## 2016-06-28 ENCOUNTER — Ambulatory Visit: Admission: RE | Admit: 2016-06-28 | Payer: Managed Care, Other (non HMO) | Source: Ambulatory Visit

## 2016-06-28 ENCOUNTER — Other Ambulatory Visit: Payer: Self-pay | Admitting: *Deleted

## 2016-06-28 DIAGNOSIS — I708 Atherosclerosis of other arteries: Secondary | ICD-10-CM | POA: Insufficient documentation

## 2016-06-28 DIAGNOSIS — R7989 Other specified abnormal findings of blood chemistry: Secondary | ICD-10-CM | POA: Insufficient documentation

## 2016-06-28 DIAGNOSIS — I700 Atherosclerosis of aorta: Secondary | ICD-10-CM | POA: Diagnosis not present

## 2016-06-28 DIAGNOSIS — J439 Emphysema, unspecified: Secondary | ICD-10-CM | POA: Insufficient documentation

## 2016-06-28 DIAGNOSIS — I712 Thoracic aortic aneurysm, without rupture: Secondary | ICD-10-CM | POA: Insufficient documentation

## 2016-06-28 MED ORDER — IOPAMIDOL (ISOVUE-370) INJECTION 76%
75.0000 mL | Freq: Once | INTRAVENOUS | Status: AC | PRN
  Administered 2016-06-28: 75 mL via INTRAVENOUS

## 2016-06-28 NOTE — Telephone Encounter (Signed)
Received incoming call from [place]. She reported CTA chest results as negative acute PE or thoracic aortic dissection; stable 4 cm aortic thoracic aneurysm.  Advised I will make Dr End aware of results.

## 2016-06-28 NOTE — Telephone Encounter (Signed)
Results called to pt. Pt verbalized understanding of results and plan of care.  

## 2016-06-28 NOTE — Telephone Encounter (Signed)
Thanks for message. Please pass these results on to Mr. Brandenburg. We will proceed with LHC as discussed yesterday.  [name], MD [facility] Pager: [phone]

## 2016-06-28 NOTE — Telephone Encounter (Signed)
-----   Message from [name], MD sent at 06/27/2016  8:24 PM EDT ----- Please let Clinton Walsh know that his d-dimer is significant elevated. I recommend that we obtain a CTA chest to exclude PE in the next 1-2 days. His labs are otherwise normal. If the CTA is negative, we will proceed with cardiac catheterization as planned next week.

## 2016-06-28 NOTE — Telephone Encounter (Signed)
S/w with scheduling and patient scheduled for CTA chest at [facility] at 11:30 today, arrival time of 11:15am. Technician said it was ok if patient had a light breakfast but do not eat or drink anything else from this point on.  Called patient and her verbalized understanding to arrive at 11:15 am, address given. Patient did not eat breakfast and was finishing his coffee. He verbalized understanding not to eat or drink anything else from this point on.

## 2016-07-02 ENCOUNTER — Other Ambulatory Visit: Payer: Self-pay | Admitting: Internal Medicine

## 2016-07-03 ENCOUNTER — Ambulatory Visit
Admission: RE | Admit: 2016-07-03 | Discharge: 2016-07-04 | Disposition: A | Discharge status: Home / Self Care | Payer: Managed Care, Other (non HMO) | Source: Ambulatory Visit | Attending: Internal Medicine | Admitting: Internal Medicine

## 2016-07-03 ENCOUNTER — Encounter
Admission: RE | Disposition: A | Discharge status: Home / Self Care | Payer: Self-pay | Source: Ambulatory Visit | Attending: Internal Medicine

## 2016-07-03 ENCOUNTER — Encounter: Payer: Self-pay | Admitting: *Deleted

## 2016-07-03 ENCOUNTER — Other Ambulatory Visit: Payer: Self-pay

## 2016-07-03 DIAGNOSIS — F17200 Nicotine dependence, unspecified, uncomplicated: Secondary | ICD-10-CM | POA: Diagnosis present

## 2016-07-03 DIAGNOSIS — I25110 Atherosclerotic heart disease of native coronary artery with unstable angina pectoris: Secondary | ICD-10-CM | POA: Insufficient documentation

## 2016-07-03 DIAGNOSIS — I255 Ischemic cardiomyopathy: Secondary | ICD-10-CM | POA: Diagnosis not present

## 2016-07-03 DIAGNOSIS — Z7982 Long term (current) use of aspirin: Secondary | ICD-10-CM | POA: Diagnosis not present

## 2016-07-03 DIAGNOSIS — G8929 Other chronic pain: Secondary | ICD-10-CM | POA: Insufficient documentation

## 2016-07-03 DIAGNOSIS — I25119 Atherosclerotic heart disease of native coronary artery with unspecified angina pectoris: Secondary | ICD-10-CM | POA: Diagnosis present

## 2016-07-03 DIAGNOSIS — R079 Chest pain, unspecified: Secondary | ICD-10-CM | POA: Diagnosis present

## 2016-07-03 DIAGNOSIS — I5022 Chronic systolic (congestive) heart failure: Secondary | ICD-10-CM | POA: Diagnosis not present

## 2016-07-03 DIAGNOSIS — R918 Other nonspecific abnormal finding of lung field: Secondary | ICD-10-CM | POA: Insufficient documentation

## 2016-07-03 DIAGNOSIS — Z720 Tobacco use: Secondary | ICD-10-CM | POA: Insufficient documentation

## 2016-07-03 DIAGNOSIS — Z8249 Family history of ischemic heart disease and other diseases of the circulatory system: Secondary | ICD-10-CM | POA: Insufficient documentation

## 2016-07-03 DIAGNOSIS — I213 ST elevation (STEMI) myocardial infarction of unspecified site: Secondary | ICD-10-CM | POA: Diagnosis not present

## 2016-07-03 DIAGNOSIS — E785 Hyperlipidemia, unspecified: Secondary | ICD-10-CM | POA: Diagnosis not present

## 2016-07-03 DIAGNOSIS — J449 Chronic obstructive pulmonary disease, unspecified: Secondary | ICD-10-CM | POA: Diagnosis not present

## 2016-07-03 DIAGNOSIS — F17201 Nicotine dependence, unspecified, in remission: Secondary | ICD-10-CM

## 2016-07-03 DIAGNOSIS — I2510 Atherosclerotic heart disease of native coronary artery without angina pectoris: Secondary | ICD-10-CM

## 2016-07-03 DIAGNOSIS — I444 Left anterior fascicular block: Secondary | ICD-10-CM | POA: Insufficient documentation

## 2016-07-03 DIAGNOSIS — I252 Old myocardial infarction: Secondary | ICD-10-CM | POA: Insufficient documentation

## 2016-07-03 DIAGNOSIS — I25118 Atherosclerotic heart disease of native coronary artery with other forms of angina pectoris: Secondary | ICD-10-CM | POA: Diagnosis present

## 2016-07-03 DIAGNOSIS — I208 Other forms of angina pectoris: Secondary | ICD-10-CM

## 2016-07-03 HISTORY — PX: CORONARY STENT INTERVENTION: CATH118234

## 2016-07-03 HISTORY — PX: LEFT HEART CATH AND CORONARY ANGIOGRAPHY: CATH118249

## 2016-07-03 HISTORY — PX: INTRAVASCULAR ULTRASOUND/IVUS: CATH118244

## 2016-07-03 HISTORY — PX: INTRAVASCULAR PRESSURE WIRE/FFR STUDY: CATH118243

## 2016-07-03 LAB — URINALYSIS, ROUTINE W REFLEX MICROSCOPIC
Bilirubin Urine: NEGATIVE
GLUCOSE, UA: NEGATIVE mg/dL
Hgb urine dipstick: NEGATIVE
Ketones, ur: NEGATIVE mg/dL
Leukocytes, UA: NEGATIVE
NITRITE: NEGATIVE
PH: 6 (ref 5.0–8.0)
Protein, ur: NEGATIVE mg/dL
SPECIFIC GRAVITY, URINE: 1.032 — AB (ref 1.005–1.030)

## 2016-07-03 LAB — CBC
HEMATOCRIT: 37.6 % — AB (ref 40.0–52.0)
HEMOGLOBIN: 12.8 g/dL — AB (ref 13.0–18.0)
MCH: 30.1 pg (ref 26.0–34.0)
MCHC: 34.1 g/dL (ref 32.0–36.0)
MCV: 88.5 fL (ref 80.0–100.0)
Platelets: 259 10*3/uL (ref 150–440)
RBC: 4.25 MIL/uL — ABNORMAL LOW (ref 4.40–5.90)
RDW: 14.3 % (ref 11.5–14.5)
WBC: 3.7 10*3/uL — ABNORMAL LOW (ref 3.8–10.6)

## 2016-07-03 LAB — COMPREHENSIVE METABOLIC PANEL
ALBUMIN: 3.1 g/dL — AB (ref 3.5–5.0)
ALT: 14 U/L — ABNORMAL LOW (ref 17–63)
ANION GAP: 6 (ref 5–15)
AST: 24 U/L (ref 15–41)
Alkaline Phosphatase: 70 U/L (ref 38–126)
BUN: 17 mg/dL (ref 6–20)
CHLORIDE: 108 mmol/L (ref 101–111)
CO2: 25 mmol/L (ref 22–32)
Calcium: 8.4 mg/dL — ABNORMAL LOW (ref 8.9–10.3)
Creatinine, Ser: 1.06 mg/dL (ref 0.61–1.24)
GFR calc Af Amer: >60 mL/min (ref >60)
GFR calc non Af Amer: >60 mL/min (ref >60)
GLUCOSE: 112 mg/dL — AB (ref 65–99)
POTASSIUM: 3.8 mmol/L (ref 3.5–5.1)
SODIUM: 139 mmol/L (ref 135–145)
TOTAL PROTEIN: 6.6 g/dL (ref 6.5–8.1)
Total Bilirubin: 0.6 mg/dL (ref 0.3–1.2)

## 2016-07-03 LAB — POCT ACTIVATED CLOTTING TIME
ACTIVATED CLOTTING TIME: 230 s
Activated Clotting Time: 224 seconds
Activated Clotting Time: 263 seconds

## 2016-07-03 SURGERY — LEFT HEART CATH AND CORONARY ANGIOGRAPHY
Anesthesia: Moderate Sedation

## 2016-07-03 MED ORDER — HEPARIN SODIUM (PORCINE) 1000 UNIT/ML IJ SOLN
INTRAMUSCULAR | Status: AC
  Filled 2016-07-03: qty 1

## 2016-07-03 MED ORDER — NITROGLYCERIN 1 MG/10 ML FOR IR/CATH LAB
INTRA_ARTERIAL | Status: DC | PRN
  Administered 2016-07-03 (×2): 100 ug via INTRACORONARY

## 2016-07-03 MED ORDER — ONDANSETRON HCL 4 MG/2ML IJ SOLN: 4.0000 mg | Freq: Four times a day (QID) | INTRAMUSCULAR | Status: DC | PRN

## 2016-07-03 MED ORDER — SODIUM CHLORIDE 0.9 % IV SOLN
INTRAVENOUS | Status: DC
  Administered 2016-07-03: 09:00:00 via INTRAVENOUS

## 2016-07-03 MED ORDER — PRASUGREL HCL 10 MG PO TABS
ORAL_TABLET | ORAL | Status: AC
Start: 2016-07-03 — End: ?
  Filled 2016-07-03: qty 3

## 2016-07-03 MED ORDER — ENOXAPARIN SODIUM 40 MG/0.4ML ~~LOC~~ SOLN
40.0000 mg | SUBCUTANEOUS | Status: DC
  Filled 2016-07-03: qty 0.4

## 2016-07-03 MED ORDER — MIDAZOLAM HCL 2 MG/2ML IJ SOLN
INTRAMUSCULAR | Status: DC | PRN
  Administered 2016-07-03: 1 mg via INTRAVENOUS

## 2016-07-03 MED ORDER — PRASUGREL HCL 10 MG PO TABS
10.0000 mg | ORAL_TABLET | Freq: Every day | ORAL | Status: DC
  Administered 2016-07-04: 10 mg via ORAL
  Filled 2016-07-03: qty 1

## 2016-07-03 MED ORDER — SODIUM CHLORIDE 0.9% FLUSH: 3.0000 mL | INTRAVENOUS | Status: DC | PRN

## 2016-07-03 MED ORDER — SODIUM CHLORIDE 0.9% FLUSH: 3.0000 mL | Freq: Two times a day (BID) | INTRAVENOUS | Status: DC

## 2016-07-03 MED ORDER — HEPARIN (PORCINE) IN NACL 2-0.9 UNIT/ML-% IJ SOLN
INTRAMUSCULAR | Status: AC | PRN
  Administered 2016-07-03: 2000 mL

## 2016-07-03 MED ORDER — ALBUTEROL SULFATE HFA 108 (90 BASE) MCG/ACT IN AERS: 2.0000 | INHALATION_SPRAY | Freq: Four times a day (QID) | RESPIRATORY_TRACT | Status: DC | PRN

## 2016-07-03 MED ORDER — FLUTICASONE FUROATE-VILANTEROL 200-25 MCG/INH IN AEPB
1.0000 | INHALATION_SPRAY | Freq: Every day | RESPIRATORY_TRACT | Status: DC
  Filled 2016-07-03: qty 28

## 2016-07-03 MED ORDER — FENTANYL CITRATE (PF) 100 MCG/2ML IJ SOLN
INTRAMUSCULAR | Status: DC | PRN
Start: 2016-07-03 — End: 2016-07-03
  Administered 2016-07-03 (×2): 25 ug via INTRAVENOUS

## 2016-07-03 MED ORDER — FENTANYL CITRATE (PF) 100 MCG/2ML IJ SOLN
INTRAMUSCULAR | Status: AC
  Filled 2016-07-03: qty 2

## 2016-07-03 MED ORDER — HEPARIN SODIUM (PORCINE) 1000 UNIT/ML IJ SOLN
INTRAMUSCULAR | Status: DC | PRN
  Administered 2016-07-03: 2000 [IU] via INTRAVENOUS
  Administered 2016-07-03: 3000 [IU] via INTRAVENOUS
  Administered 2016-07-03: 2000 [IU] via INTRAVENOUS
  Administered 2016-07-03: 1500 [IU] via INTRAVENOUS

## 2016-07-03 MED ORDER — SODIUM CHLORIDE 0.9% FLUSH
3.0000 mL | INTRAVENOUS | Status: DC | PRN
  Administered 2016-07-04: 3 mL via INTRAVENOUS
  Filled 2016-07-03: qty 3

## 2016-07-03 MED ORDER — HYDRALAZINE HCL 20 MG/ML IJ SOLN: 5.0000 mg | INTRAMUSCULAR | Status: AC | PRN

## 2016-07-03 MED ORDER — RANOLAZINE ER 500 MG PO TB12
500.0000 mg | ORAL_TABLET | Freq: Two times a day (BID) | ORAL | Status: DC
  Administered 2016-07-03 – 2016-07-04 (×2): 500 mg via ORAL
  Filled 2016-07-03 (×3): qty 1

## 2016-07-03 MED ORDER — PRASUGREL HCL 10 MG PO TABS
ORAL_TABLET | ORAL | Status: DC | PRN
  Administered 2016-07-03: 30 mg via ORAL

## 2016-07-03 MED ORDER — OXYCODONE-ACETAMINOPHEN 5-325 MG PO TABS
1.0000 | ORAL_TABLET | Freq: Four times a day (QID) | ORAL | Status: DC | PRN
  Administered 2016-07-03 – 2016-07-04 (×2): 1 via ORAL
  Filled 2016-07-03 (×2): qty 1

## 2016-07-03 MED ORDER — VERAPAMIL HCL 2.5 MG/ML IV SOLN
INTRAVENOUS | Status: AC
  Filled 2016-07-03: qty 2

## 2016-07-03 MED ORDER — SODIUM CHLORIDE 0.9 % IV SOLN: 250.0000 mL | INTRAVENOUS | Status: DC | PRN

## 2016-07-03 MED ORDER — ACETAMINOPHEN 325 MG PO TABS: 650.0000 mg | ORAL_TABLET | ORAL | Status: DC | PRN

## 2016-07-03 MED ORDER — SODIUM CHLORIDE 0.9% FLUSH
3.0000 mL | Freq: Two times a day (BID) | INTRAVENOUS | Status: DC
  Administered 2016-07-03 – 2016-07-04 (×2): 3 mL via INTRAVENOUS

## 2016-07-03 MED ORDER — CARVEDILOL 3.125 MG PO TABS
3.1250 mg | ORAL_TABLET | Freq: Two times a day (BID) | ORAL | Status: DC
  Administered 2016-07-03 – 2016-07-04 (×2): 3.125 mg via ORAL
  Filled 2016-07-03 (×2): qty 1

## 2016-07-03 MED ORDER — ALBUTEROL SULFATE (2.5 MG/3ML) 0.083% IN NEBU: 2.5000 mg | INHALATION_SOLUTION | Freq: Four times a day (QID) | RESPIRATORY_TRACT | Status: DC | PRN

## 2016-07-03 MED ORDER — OXYCODONE HCL 5 MG PO TABS
5.0000 mg | ORAL_TABLET | Freq: Once | ORAL | Status: AC
  Administered 2016-07-03: 5 mg via ORAL

## 2016-07-03 MED ORDER — SODIUM CHLORIDE 0.9 % IV SOLN
INTRAVENOUS | Status: AC
  Administered 2016-07-03: 13:00:00 via INTRAVENOUS

## 2016-07-03 MED ORDER — ASPIRIN EC 81 MG PO TBEC
81.0000 mg | DELAYED_RELEASE_TABLET | Freq: Every day | ORAL | Status: DC
  Administered 2016-07-04: 81 mg via ORAL
  Filled 2016-07-03: qty 1

## 2016-07-03 MED ORDER — ASPIRIN 81 MG PO CHEW: 81.0000 mg | CHEWABLE_TABLET | ORAL | Status: DC

## 2016-07-03 MED ORDER — NITROGLYCERIN 5 MG/ML IV SOLN
INTRAVENOUS | Status: AC
  Filled 2016-07-03: qty 10

## 2016-07-03 MED ORDER — UMECLIDINIUM BROMIDE 62.5 MCG/INH IN AEPB
1.0000 | INHALATION_SPRAY | Freq: Every day | RESPIRATORY_TRACT | Status: DC
  Filled 2016-07-03: qty 7

## 2016-07-03 MED ORDER — ATORVASTATIN CALCIUM 20 MG PO TABS
80.0000 mg | ORAL_TABLET | Freq: Every day | ORAL | Status: DC
  Administered 2016-07-03: 80 mg via ORAL
  Filled 2016-07-03: qty 4

## 2016-07-03 MED ORDER — ADENOSINE (DIAGNOSTIC) 3 MG/ML IV SOLN
INTRAVENOUS | Status: AC
  Filled 2016-07-03: qty 30

## 2016-07-03 MED ORDER — MIDAZOLAM HCL 2 MG/2ML IJ SOLN
INTRAMUSCULAR | Status: AC
  Filled 2016-07-03: qty 2

## 2016-07-03 MED ORDER — OXYCODONE HCL 5 MG PO TABS
ORAL_TABLET | ORAL | Status: AC
  Filled 2016-07-03: qty 1

## 2016-07-03 SURGICAL SUPPLY — 19 items
BALLN MINITREK RX 2.0X12 (BALLOONS) ×4
BALLN ~~LOC~~ TREK RX 2.5X12 (BALLOONS) ×4
BALLOON MINITREK RX 2.0X12 (BALLOONS) ×2 IMPLANT
BALLOON ~~LOC~~ TREK RX 2.5X12 (BALLOONS) ×2 IMPLANT
CATH EAGLE EYE PLAT IMAGING (CATHETERS) ×4 IMPLANT
CATH INFINITI 5 FR JL3.5 (CATHETERS) ×4 IMPLANT
CATH INFINITI 5FR ANG PIGTAIL (CATHETERS) IMPLANT
CATH INFINITI 5FR JL4 (CATHETERS) IMPLANT
CATH INFINITI JR4 5F (CATHETERS) ×4 IMPLANT
CATH VISTA GUIDE 6FR XB3 (CATHETERS) ×4 IMPLANT
DEVICE INFLAT 30 PLUS (MISCELLANEOUS) ×4 IMPLANT
DEVICE RAD TR BAND REGULAR (VASCULAR PRODUCTS) ×4 IMPLANT
GLIDESHEATH SLEND SS 6F .021 (SHEATH) ×4 IMPLANT
KIT MANI 3VAL PERCEP (MISCELLANEOUS) ×4 IMPLANT
PACK CARDIAC CATH (CUSTOM PROCEDURE TRAY) ×4 IMPLANT
STENT XIENCE ALPINE RX 2.5X23 (Permanent Stent) ×4 IMPLANT
WIRE HITORQ VERSACORE ST 145CM (WIRE) ×4 IMPLANT
WIRE PRESSURE VERRATA (WIRE) ×4 IMPLANT
WIRE ROSEN-J .035X260CM (WIRE) ×4 IMPLANT

## 2016-07-03 NOTE — Progress Notes (Signed)
56 yo wm admitted to room 240 from [place] s/p left heart catheterization, DES placed in Mid LAD through rt radial artery.  Dressing to rt radial artery dry and intact, pt instructed on limited use of rt hand/arm, pt verbalizes understanding.  A&O x3, gait steady. No distress on ra.  Cardiac monitor placed on pt and verified with Candace, CNA, pt denies chest pain at this time.  Lungs clear bil.  Pt has numerous scabbed abrasions to rt/lt arm and rt leg.  Small amount of swelling to lower rt arm, pt states it was this way before coming to hospital.  IVF infusing well lt fa.  SL lt fa flushes well.  Pt oriented to room and surroundings, POC reviewed with pt and wife.  Denies need at this time. CB in reach, SR up x 2.

## 2016-07-03 NOTE — Progress Notes (Signed)
Report given to Tanya, right radial WNL. Patient's urine tea colored, labs drawn, urine specimen pending collection.

## 2016-07-03 NOTE — Interval H&P Note (Signed)
History and Physical Interval Note:  07/03/2016 10:25 AM  Clinton Walsh  has presented today for cardiac catheterization, with the diagnosis of chest pain. The various methods of treatment have been discussed with the patient and family. After consideration of risks, benefits and other options for treatment, the patient has consented to  Procedure(s): Left Heart Cath and Coronary Angiography (N/A) as a surgical intervention .  The patient's history has been reviewed, patient examined, no change in status, stable for surgery.  I have reviewed the patient's chart and labs.  Questions were answered to the patient's satisfaction.    Cath Lab Visit (complete for each Cath Lab visit)  Clinical Evaluation Leading to the Procedure:   ACS: No.  Non-ACS:    Anginal Classification: CCS IV  Anti-ischemic medical therapy: Maximal Therapy (2 or more classes of medications)  Non-Invasive Test Results: No non-invasive testing performed  Prior CABG: No previous CABG  Clinton Walsh

## 2016-07-03 NOTE — Plan of Care (Signed)
Problem: Safety: Goal: Ability to remain free from injury will improve Outcome: Progressing Fall precautions in place, non skid socks when oob  Problem: Pain Managment: Goal: General experience of comfort will improve Outcome: Progressing Prn medications  Problem: Tissue Perfusion: Goal: Risk factors for ineffective tissue perfusion will decrease Outcome: Progressing SQ Lovenox  Problem: Cardiovascular: Goal: Ability to achieve and maintain adequate cardiovascular perfusion will improve Outcome: Progressing S/P left heart cath, DES to mid LAD, rt radial site benign

## 2016-07-03 NOTE — Brief Op Note (Signed)
Brief Cardiac Catheterization Note  Date: 07/03/2016 Time: 12:37 PM  PATIENT:  Clinton Walsh  56 y.o. male  PRE-OPERATIVE DIAGNOSIS: Chest pain  POST-OPERATIVE DIAGNOSIS:  Same  PROCEDURE:  Procedure(s): Left Heart Cath and Coronary Angiography (N/A) Intravascular Pressure Wire/FFR Study (N/A)  SURGEON:  Surgeon(s) and Role:    * Valmai Vandenberghe, Harrell Gave, MD - Primary  FINDINGS: 1. Patent stents in proximal/mid RCA and D1. 2. Diffuse mid LAD disease up to 60%; FFR 0.78. 3. Small OM2 branch with 70% proximal stenosis; vessel too small for PCI. 4. Normal LVEDP. 5. Moderately reduced LVEF with anterior and inferior hypokinesis. 6. Successful FFR and IVUS-guided PCI to mid LAD with placement of Xience Alpine 2.5 x 23 mm DES.  RECOMMENDATIONS: 1. Overnight observation. 2. Continue DAPT with ASA and prasugrel for at least 12 months from STEMI in 04/2016. 3. Aggressive secondary prevention.  [name], MD [facility] Pager: [phone]

## 2016-07-03 NOTE — Progress Notes (Signed)
Dr End notified of patient's tea colored urine. Patient stated that he was unsure whether this was normal for him. MD placed orders

## 2016-07-03 NOTE — Progress Notes (Signed)
Called and spoke with MD re: pt. C/o constant "bouncing all over the place chest pain." States "it's left, then it's right/back & forth." States "8' on scale 1-10. New orders received. Pt. Med. With Oxycodone 5 mg IR p.o. Now. 12 lead EKG done & taken to Dr. Saunders Revel now in cath lab. Pt. In no acute distress. Denies SOB, N/V, HA, dizziness.

## 2016-07-03 NOTE — H&P (View-Only) (Signed)
Follow-up Outpatient Visit Date: 06/27/2016  Primary Care Provider: Glean Hess, MD [address] [place] [address]  Chief Complaint: Chest pain  HPI:  Clinton Walsh is a 56 y.o. year-old male with history of coronary artery disease status post PCI to diagonal in 3220 complicated by acute stent thrombosis and high risk NSTEMI with subtotal thrombotic occlusion of the proximal/mid RCA in 04/2016, ischemic cardiomyopathy, recently diagnosed lung mass (presumed cancer), and prior tobacco use, who presents for follow-up of chest pain. I last saw him about 3 weeks ago, at which time we switched him from ticagrelor to prasugrel due to significant shortness of breath. Though his breathing has improved significantly, he has noted increased frequency of bilateral chest pain. He describes a sharp, stabbing sensation that can occur at any time and is reminiscent of what he has experienced in the past with his MIs. The symptoms are not exertional or positional. They typically last 3-4 minutes and sometimes can be 10/10 in intensity. He does not have any associated symptoms other than the sensation of a "all of cotton" in the right side of his chest. He also notes increased cough over the last few days. He becomes dizzy at times after coughing but otherwise denies lightheadedness. He has not had any orthopnea, PND, or palpitations. Clinton Walsh endorses occasional bilateral leg swelling, which seems dependent.  -------------------------------------------------------------------------------------------------- Cardiovascular History & Procedures: Cardiovascular Problems:  Coronary artery disease  Ischemic cardiomyopathy  Risk Factors:  Known coronary artery disease, male gender, and history of tobacco use  Cath/PCI:  LHC/PCI (04/10/16): LMCA normal. LAD with 30% mid vessel disease. D1 with 30% ostial stenosis and patent proximal stent with 20% in-stent restenosis. LCx with 50%  proximal/mid disease as well as 40% proximal OM 2 stenosis. Large, dominant RCA with 30% ostial and 90% proximal/mid stenosis with intraluminal thrombus. Successful IVUS-guided PCI to the proximal/mid RCA with placement of a Resolute Integrity 3.0 x 34 mm drug-eluting stent. LVEDP 13 mmHg  CV Surgery:  None  EP Procedures and Devices:  None  Non-Invasive Evaluation(s):  TTE (04/10/16): Normal LV size with LVEF of 30-35% and hypokinesis of the inferior and inferolateral myocardium. Grade 1 diastolic dysfunction. Trivial aortic regurgitation. Mild to moderate mitral regurgitation. Mildly reduced right ventricular contraction. Normal pulmonary artery pressure.  Recent CV Pertinent Labs: Lab Results  Component Value Date   CHOL 183 04/11/2016   CHOL 192 04/14/2011   HDL 34 (L) 04/11/2016   HDL 31 (L) 04/14/2011   LDLCALC 131 (H) 04/11/2016   LDLCALC 134 (H) 04/14/2011   TRIG 90 04/11/2016   TRIG 137 04/14/2011   CHOLHDL 5.4 04/11/2016   INR 0.96 04/10/2016   K 3.8 04/11/2016   K 3.9 09/19/2011   MG 1.8 04/15/2011   BUN 18 04/11/2016   BUN 14 08/29/2015   BUN 14 09/19/2011   CREATININE 0.93 04/11/2016   CREATININE 0.97 09/19/2011    Past medical and surgical history were reviewed and updated in EPIC.  Outpatient Encounter Prescriptions as of 06/27/2016  Medication Sig  . albuterol (PROVENTIL HFA;VENTOLIN HFA) 108 (90 Base) MCG/ACT inhaler Inhale 2 puffs into the lungs every 6 (six) hours as needed for wheezing or shortness of breath.  Marland Kitchen aspirin EC 81 MG tablet Take 1 tablet (81 mg total) by mouth daily.  Marland Kitchen atorvastatin (LIPITOR) 80 MG tablet Take 1 tablet (80 mg total) by mouth daily at 6 PM.  . carvedilol (COREG) 3.125 MG tablet Take 1 tablet (3.125 mg  total) by mouth 2 (two) times daily with a meal.  . fluticasone furoate-vilanterol (BREO ELLIPTA) 200-25 MCG/INH AEPB Inhale 1 puff into the lungs daily.  Marland Kitchen lisinopril (PRINIVIL,ZESTRIL) 5 MG tablet Take 1 tablet (5 mg total)  by mouth daily.  . prasugrel (EFFIENT) 10 MG TABS tablet Take 1 tablet (10 mg total) by mouth daily.  Marland Kitchen umeclidinium bromide (INCRUSE ELLIPTA) 62.5 MCG/INH AEPB Inhale 1 puff into the lungs daily.   No facility-administered encounter medications on file as of 06/27/2016.     Allergies: Sulfa antibiotics  Social History   Social History  . Marital status: Married    Spouse name: N/A  . Number of children: N/A  . Years of education: N/A   Occupational History  . 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 Smoking status: Former Smoker    Packs/day: 0.50    Years: 30.00    Types: E-cigarettes, Cigarettes    Quit date: 07/2015  . Smokeless tobacco: Never Used     Comment: quit 7/17, uses vap cigarettes now 6 mg nicotine-just dropped to 3 mg " to make heart Dr happy"per pt  . Alcohol use No     Comment: none x 10y  . Drug use: No  . Sexual activity: Not Currently   Other Topics Concern  . Not on file   Social History Narrative   Lives in [place] with his wife    Family History  Problem Relation Age of Onset  . Other Father        "Heart problems" pacemaker  . Prostate cancer Father   . Heart attack Father   . Other Mother        "heart problems"  . Heart attack Mother   . Other Maternal Grandfather        "heart exploded" in his 54s    Review of Systems: A 12-system review of systems was performed and was negative except as noted in the HPI.  --------------------------------------------------------------------------------------------------  Physical Exam: BP 98/78 (BP Location: Left Arm, Patient Position: Sitting, Cuff Size: Normal)   Pulse (!) 57   Ht 5\' 5"  (1.651 m)   Wt 115 lb 12 oz (52.5 kg)   BMI 19.26 kg/m   General:  Thin, chronically ill-appearing man, seated comfortably in the exam room. HEENT: No conjunctival pallor or scleral icterus.  Moist mucous membranes.  OP clear. Neck: Supple  without lymphadenopathy, thyromegaly, JVD, or HJR.  No carotid bruit. Lungs: Normal work of breathing.  Clear to auscultation bilaterally without wheezes or crackles. Heart: Regular rate and rhythm without murmurs, rubs, or gallops.  Non-displaced PMI. Abd: Bowel sounds present.  Soft, NT/ND without hepatosplenomegaly Ext: No lower extremity edema.  Radial, PT, and DP pulses are 2+ bilaterally. Skin: Warm and dry without rash.  EKG:  Sinus rhythm with left anterior fascicular block. Previously noted T-wave inversions are no longer evident.  Lab Results  Component Value Date   WBC 4.6 04/11/2016   HGB 13.7 04/11/2016   HCT 40.2 04/11/2016   MCV 89.7 04/11/2016   PLT 224 04/11/2016    Lab Results  Component Value Date   NA 138 04/11/2016   K 3.8 04/11/2016   CL 109 04/11/2016   CO2 24 04/11/2016   BUN 18 04/11/2016   CREATININE 0.93 04/11/2016   GLUCOSE 88 04/11/2016   ALT 14 (L) 04/10/2016    Lab Results  Component  Value Date   CHOL 183 04/11/2016   HDL 34 (L) 04/11/2016   LDLCALC 131 (H) 04/11/2016   TRIG 90 04/11/2016   CHOLHDL 5.4 04/11/2016   --------------------------------------------------------------------------------------------------  ASSESSMENT AND PLAN: Coronary artery disease with atypical angina Clinton Walsh continues to have frequent bouts of sharp chest pain, somewhat increased in frequency from her last visit. He is concerned that this reflects worsening CAD, as the pain is similar to what he experienced at the time of his MIs in 2012 and earlier this year. Of note, cardiac catheterization at the time of his inferior STEMI showed only mild left coronary artery disease. Post PCI, there was no significant disease involving the RCA. We discussed performing a myocardial perfusion stress test, Clinton Walsh is hesitant to proceed with a stress test due to "false negatives" in the past. We have therefore agreed to perform repeat left heart catheterization to  ensure that he does not have any high-grade disease to account for his chest pain. I have reviewed the risks, indications, and alternatives to cardiac catheterization, possible angioplasty, and stenting with the patient. Risks include but are not limited to bleeding, infection, vascular injury, stroke, myocardial infection, arrhythmia, kidney injury, radiation-related injury in the case of prolonged fluoroscopy use, emergency cardiac surgery, and death. The patient understands the risks of serious complication is 1-2 in 0383 with diagnostic cardiac cath and 1-2% or less with angioplasty/stenting. It is certainly possible that his chest pain could be due to his underlying lung disease, including lung mass noted at the time of his MI in April. In the setting of likely malignancy, DVT PE is also A consideration. I will check a d-dimer as part of his labs today for the upcoming catheterization. If the d-dimer is elevated, we will need to consider CT chest to exclude PE. We will add ranolazine 500 mg twice a day for anti-anginal therapy pending catheterization.  Ischemic cardiomyopathy with chronic systolic heart failure Clinton Walsh again appears euvolemic with NYHA class II-III heart failure symptoms. His dyspnea has improved since switching from ticagrelor to present her old. However, he continues to have significant weakness that is likely multifactorial. We will not make any medication changes today. We will repeat an echocardiogram in about one month.  Lung mass The patient continues to undergo observation with Dr. Mortimer Fries. Follow-up is scheduled for next month.  Follow-up: To be determined based on cardiac catheterization  [name], MD 06/27/2016 2:43 PM

## 2016-07-03 NOTE — Progress Notes (Signed)
Pt. States he feels no change in his pain; states "it is just located under my left chest." Cath lab techs. Here to take pt. To lab now.

## 2016-07-04 ENCOUNTER — Other Ambulatory Visit: Payer: Self-pay

## 2016-07-04 ENCOUNTER — Encounter: Payer: Self-pay | Admitting: Cardiovascular Disease

## 2016-07-04 DIAGNOSIS — I208 Other forms of angina pectoris: Secondary | ICD-10-CM | POA: Diagnosis not present

## 2016-07-04 DIAGNOSIS — G894 Chronic pain syndrome: Secondary | ICD-10-CM | POA: Diagnosis not present

## 2016-07-04 DIAGNOSIS — I2510 Atherosclerotic heart disease of native coronary artery without angina pectoris: Secondary | ICD-10-CM | POA: Diagnosis not present

## 2016-07-04 DIAGNOSIS — I2089 Other forms of angina pectoris: Secondary | ICD-10-CM

## 2016-07-04 DIAGNOSIS — R079 Chest pain, unspecified: Secondary | ICD-10-CM | POA: Diagnosis not present

## 2016-07-04 DIAGNOSIS — E782 Mixed hyperlipidemia: Secondary | ICD-10-CM

## 2016-07-04 DIAGNOSIS — F17201 Nicotine dependence, unspecified, in remission: Secondary | ICD-10-CM | POA: Diagnosis not present

## 2016-07-04 DIAGNOSIS — I25110 Atherosclerotic heart disease of native coronary artery with unstable angina pectoris: Secondary | ICD-10-CM | POA: Diagnosis not present

## 2016-07-04 LAB — CBC
HCT: 37.1 % — ABNORMAL LOW (ref 40.0–52.0)
Hemoglobin: 12.7 g/dL — ABNORMAL LOW (ref 13.0–18.0)
MCH: 30.2 pg (ref 26.0–34.0)
MCHC: 34.2 g/dL (ref 32.0–36.0)
MCV: 88.5 fL (ref 80.0–100.0)
Platelets: 243 10*3/uL (ref 150–440)
RBC: 4.19 MIL/uL — ABNORMAL LOW (ref 4.40–5.90)
RDW: 14.2 % (ref 11.5–14.5)
WBC: 3.4 10*3/uL — AB (ref 3.8–10.6)

## 2016-07-04 LAB — BASIC METABOLIC PANEL
Anion gap: 6 (ref 5–15)
BUN: 14 mg/dL (ref 6–20)
CALCIUM: 8.3 mg/dL — AB (ref 8.9–10.3)
CO2: 25 mmol/L (ref 22–32)
Chloride: 108 mmol/L (ref 101–111)
Creatinine, Ser: 1.08 mg/dL (ref 0.61–1.24)
GFR calc Af Amer: >60 mL/min (ref >60)
GLUCOSE: 92 mg/dL (ref 65–99)
Potassium: 3.8 mmol/L (ref 3.5–5.1)
SODIUM: 139 mmol/L (ref 135–145)

## 2016-07-04 NOTE — Plan of Care (Signed)
Problem: Health Behavior/Discharge Planning: Goal: Ability to manage health-related needs will improve Outcome: Completed/Met Date Met: 07/04/16 Discharge instructions re post cath care, appointments and meds

## 2016-07-04 NOTE — Discharge Summary (Signed)
Physician Discharge Summary  Patient ID: Clinton Walsh MRN: [id number] DOB/AGE: March 06, 1960 56 y.o.  Admit date: 07/03/2016 Discharge date: 07/04/2016  Admission Diagnoses:unstable angina, chest pain, CAD  Discharge Diagnoses:  Principal Problem:   Chest pain S/p PCI to the LAD Active Problems:   Hyperlipidemia   Tobacco use disorder, severe, in early remission   CAD in native artery   Stable angina [facility])   Discharged Condition: stable  Hospital Course:  Cardiac cath 07/03/2016 (prescheduled for unstable angina) 1. Patent stents in the RCA and D1. D1 stent has mild in-stent restenosis. 2. Moderate to severe CAD involving the mid LAD and distal LCx/OM2 branch. FFR of mid LAD is hemodynamically significant at 0.78. 3. Normal left ventricular filling pressure. 4. Moderately reduced left ventricular contraction with mid/apical anterior and inferior hypokinesis. 5. Successful FFR and IVUS guided PCI to the mid LAD using a Xience Alpine 2.5 x 23 mm drug-eluting stent with 0% residual stenosis and TIMI 3 flow.  Recommendations: 1. Continue dual antiplatelet therapy with aspirin and prasugrel for at least 12 months from time of STEMI in 04/2016. 2. Aggressive secondary prevention and medical therapy of small vessel disease involving distal LCx and OM2. 3. Continue medical therapy of ischemic cardiomyopathy, with uptitration of lisinopril and carvedilol, as heart rate and blood pressure tolerate.  No events overnight Stable lab work in Am 07/04/2016  Consults: none  Significant Diagnostic Studies: cardiac cath 07/03/2016 as above  Treatments:   Discharge Exam: Blood pressure 108/61, pulse (!) 44, temperature 97.8 F (36.6 C), temperature source Oral, resp. rate (!) 0, height 5\' 5"  (1.651 m), weight 119 lb 9.6 oz (54.3 kg), SpO2 98 %. General:  Thin, NAD HEENT:   Moist mucous membranes.  OP clear. Neck: Supple without lymphadenopathy, thyromegaly, JVD, No carotid bruit. Lungs:  Normal work of breathing.  Clear to auscultation bilaterally without wheezes or crackles. Heart: Regular rate and rhythm without murmurs, rubs, or gallops.  Non-displaced PMI. Abd: Bowel sounds present.  Soft, NT/ND without hepatosplenomegaly Ext: No lower extremity edema.  Radial, PT, and DP pulses are 2+ bilaterally. Skin: Warm and dry without rash.  Disposition: 01-Home or Self Care  Discharge Instructions    AMB Referral to Cardiac Rehabilitation - Phase II    Complete by:  As directed    Diagnosis:  Coronary Stents     Allergies as of 07/04/2016      Reactions   Sulfa Antibiotics Rash      Medication List    STOP taking these medications   albuterol 108 (90 Base) MCG/ACT inhaler Commonly known as:  PROVENTIL HFA;VENTOLIN HFA     TAKE these medications   aspirin EC 81 MG tablet Take 1 tablet (81 mg total) by mouth daily.   atorvastatin 80 MG tablet Commonly known as:  LIPITOR Take 1 tablet (80 mg total) by mouth daily at 6 PM.   carvedilol 3.125 MG tablet Commonly known as:  COREG Take 1 tablet (3.125 mg total) by mouth 2 (two) times daily with a meal.   fluticasone furoate-vilanterol 200-25 MCG/INH Aepb Commonly known as:  BREO ELLIPTA Inhale 1 puff into the lungs daily.   lisinopril 5 MG tablet Commonly known as:  PRINIVIL,ZESTRIL Take 1 tablet (5 mg total) by mouth daily.   prasugrel 10 MG Tabs tablet Commonly known as:  EFFIENT Take 1 tablet (10 mg total) by mouth daily.   predniSONE 10 MG tablet Commonly known as:  DELTASONE Take 10 mg by mouth daily as  needed (polymyalgia flares).   ranolazine 500 MG 12 hr tablet Commonly known as:  RANEXA Take 1 tablet (500 mg total) by mouth 2 (two) times daily.   umeclidinium bromide 62.5 MCG/INH Aepb Commonly known as:  INCRUSE ELLIPTA Inhale 1 puff into the lungs daily.        Signed: Ida Rogue 07/04/2016, 12:37 PM

## 2016-07-04 NOTE — Progress Notes (Signed)
Progress Note  Patient Name: Clinton Walsh Date of Encounter: 07/04/2016  Primary Cardiologist: End, [place]   Cardiac catheterization results discussed with him from yesterday Stent placed to his LAD He reports continued right side chest pain overnight, shoulder pain Received Percocet, does not like the way this makes him feel, prefers OxyContin Feels his polymyalgia is coming back as he has had bilateral chest arm pain for the past week Reports this is managed by primary care   Inpatient Medications    aspirin EC 81 MG tablet Take 1 tablet (81 mg total) by mouth daily.   atorvastatin 80 MG tablet Commonly known as:  LIPITOR Take 1 tablet (80 mg total) by mouth daily at 6 PM.   carvedilol 3.125 MG tablet Commonly known as:  COREG Take 1 tablet (3.125 mg total) by mouth 2 (two) times daily with a meal.   fluticasone furoate-vilanterol 200-25 MCG/INH Aepb Commonly known as:  BREO ELLIPTA Inhale 1 puff into the lungs daily.   lisinopril 5 MG tablet Commonly known as:  PRINIVIL,ZESTRIL Take 1 tablet (5 mg total) by mouth daily.   prasugrel 10 MG Tabs tablet Commonly known as:  EFFIENT Take 1 tablet (10 mg total) by mouth daily.   predniSONE 10 MG tablet Commonly known as:  DELTASONE Take 10 mg by mouth daily as needed (polymyalgia flares).   ranolazine 500 MG 12 hr tablet Commonly known as:  RANEXA Take 1 tablet (500 mg total) by mouth 2 (two) times daily.   umeclidinium bromide 62.5 MCG/INH Aepb Commonly known as:  INCRUSE ELLIPTA Inhale 1 puff into the lungs daily.     Vital Signs    Vitals:   07/03/16 1929 07/04/16 0415 07/04/16 0913 07/04/16 1223  BP: (!) 143/84 109/69 123/77 108/61  Pulse: (!) 55 (!) 50 (!) 49 (!) 44  Resp: 19 15 16  (!) 0  Temp: 97.8 F (36.6 C) 97.7 F (36.5 C) 98 F (36.7 C) 97.8 F (36.6 C)  TempSrc: Oral Oral Oral Oral  SpO2: 99% 100% 99% 98%  Weight:      Height:        Intake/Output Summary  (Last 24 hours) at 07/04/16 1749 Last data filed at 07/04/16 0800  Gross per 24 hour  Intake              460 ml  Output              600 ml  Net             -140 ml   Filed Weights   07/03/16 0845 07/03/16 1535  Weight: 115 lb (52.2 kg) 119 lb 9.6 oz (54.3 kg)    Telemetry    Normal sinus rhythm - Personally Reviewed  ECG      Physical Exam   GEN: No acute distress.   Neck: No JVD Cardiac: RRR, no murmurs, rubs, or gallops.  Respiratory: Clear to auscultation bilaterally. GI: Soft, nontender, non-distended  MS: No edema; No deformity. Neuro:  Nonfocal  Psych: Normal affect   Labs    Chemistry Recent Labs Lab 07/03/16 1404 07/04/16 0502  NA 139 139  K 3.8 3.8  CL 108 108  CO2 25 25  GLUCOSE 112* 92  BUN 17 14  CREATININE 1.06 1.08  CALCIUM 8.4* 8.3*  PROT 6.6  --   ALBUMIN 3.1*  --   AST 24  --   ALT 14*  --   ALKPHOS 70  --  BILITOT 0.6  --   GFRNONAA >60 >60  GFRAA >60 >60  ANIONGAP 6 6     Hematology Recent Labs Lab 07/03/16 1404 07/04/16 0502  WBC 3.7* 3.4*  RBC 4.25* 4.19*  HGB 12.8* 12.7*  HCT 37.6* 37.1*  MCV 88.5 88.5  MCH 30.1 30.2  MCHC 34.1 34.2  RDW 14.3 14.2  PLT 259 243    Cardiac EnzymesNo results for input(s): TROPONINI in the last 168 hours. No results for input(s): TROPIPOC in the last 168 hours.   BNPNo results for input(s): BNP, PROBNP in the last 168 hours.   DDimer No results for input(s): DDIMER in the last 168 hours.   Radiology    No results found.  Cardiac Studies   Cardiac catheterization 07/03/2016 (for unstable angina) 1. Patent stents in the RCA and D1. D1 stent has mild in-stent restenosis. 2. Moderate to severe CAD involving the mid LAD and distal LCx/OM2 branch. FFR of mid LAD is hemodynamically significant at 0.78. 3. Normal left ventricular filling pressure. 4. Moderately reduced left ventricular contraction with mid/apical anterior and inferior hypokinesis. 5. Successful FFR and IVUS guided  PCI to the mid LAD using a Xience Alpine 2.5 x 23 mm drug-eluting stent with 0% residual stenosis and TIMI 3 flow.  Recommendations: 1. Continue dual antiplatelet therapy with aspirin and prasugrel for at least 12 months from time of STEMI in 04/2016. 2. Aggressive secondary prevention and medical therapy of small vessel disease involving distal LCx and OM2. 3. Continue medical therapy of ischemic cardiomyopathy, with uptitration of lisinopril and carvedilol, as heart rate and blood pressure tolerate.  Patient Profile     56 y.o. male coronary artery disease, chronic pain, status post PCI to diagonal in 8984 complicated by acute stent thrombosis and high risk NSTEMI with subtotal thrombotic occlusion of the proximal/mid RCA in 04/2016, ischemic cardiomyopathy, recently diagnosed lung mass (presumed cancer), and prior tobacco use, With cardiac catheterization yesterday, stent to his LAD   Assessment & Plan    1) CAD, Stent to the LAD Recommended compliance with his aspirin and effient Smoking cessation, Suggested he stay on his statin Currently on beta blocker, ACE inhibitor  2) chronic pain Long discussion with him, does not appear to be angina given no significant improvement after stent placement Requesting pain medication Recommended he follow-up with primary care He does report having rheumatologic issue previously treated with prednisone  3) COPD  on inhalers   4) cardiomyopathy, ischemic Ejection fraction 30-35% by echocardiogram April 2018  Long discussion with patient and his wife concerning his ejection fraction, chronic pain issues, rheumatologic issues  Total encounter time more than 35 minutes  Greater than 50% was spent in counseling and coordination of care with the patient   Signed, Ida Rogue, MD  07/04/2016, 5:49 PM

## 2016-07-05 ENCOUNTER — Encounter: Payer: Self-pay | Admitting: Internal Medicine

## 2016-07-09 ENCOUNTER — Telehealth: Payer: Self-pay | Admitting: *Deleted

## 2016-07-09 NOTE — Telephone Encounter (Signed)
-----   Message from Blain Pais sent at 07/09/2016  2:42 PM EDT ----- Regarding: TCM/PH 7/18 10:30 Murray Hodgkins, NP

## 2016-07-09 NOTE — Telephone Encounter (Signed)
Patient contacted regarding discharge from [facility] on 07/04/16.  Patient understands to follow up with provider Ignacia Bayley NP on 07/18/16 at 10:30 AM at [facility]. Patient understands discharge instructions? Yes Patient understands medications and regiment? Yes Patient understands to bring all medications to this visit? Yes  Patient confirmed appointment and verbalized understanding with no further questions at this time.

## 2016-07-10 NOTE — Addendum Note (Signed)
Addended by: Britt Bottom on: 07/10/2016 07:56 AM   Modules accepted: Orders

## 2016-07-18 ENCOUNTER — Encounter: Payer: Self-pay | Admitting: Nurse Practitioner

## 2016-07-18 ENCOUNTER — Ambulatory Visit (INDEPENDENT_AMBULATORY_CARE_PROVIDER_SITE_OTHER): Payer: Managed Care, Other (non HMO) | Admitting: Nurse Practitioner

## 2016-07-18 VITALS — BP 118/76 | HR 59 | Ht 65.0 in | Wt 116.0 lb

## 2016-07-18 DIAGNOSIS — R911 Solitary pulmonary nodule: Secondary | ICD-10-CM | POA: Diagnosis not present

## 2016-07-18 DIAGNOSIS — I255 Ischemic cardiomyopathy: Secondary | ICD-10-CM

## 2016-07-18 DIAGNOSIS — I2510 Atherosclerotic heart disease of native coronary artery without angina pectoris: Secondary | ICD-10-CM

## 2016-07-18 DIAGNOSIS — I5022 Chronic systolic (congestive) heart failure: Secondary | ICD-10-CM | POA: Diagnosis not present

## 2016-07-18 DIAGNOSIS — E785 Hyperlipidemia, unspecified: Secondary | ICD-10-CM | POA: Diagnosis not present

## 2016-07-18 DIAGNOSIS — Z720 Tobacco use: Secondary | ICD-10-CM

## 2016-07-18 NOTE — Patient Instructions (Signed)
Medication Instructions:  Your physician recommends that you continue on your current medications as directed. Please refer to the Current Medication list given to you today.   Labwork: Lipid and liver profile today   Testing/Procedures: none  Follow-Up: Your physician recommends that you schedule a follow-up appointment in: 3 months with Dr. Saunders Revel.    Any Other Special Instructions Will Be Listed Below (If Applicable).     If you need a refill on your cardiac medications before your next appointment, please call your pharmacy.
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
## 2016-07-19 LAB — LIPID PANEL
Chol/HDL Ratio: 3.7 ratio (ref 0.0–5.0)
Cholesterol, Total: 123 mg/dL (ref 100–199)
HDL: 33 mg/dL — AB (ref >39)
LDL Calculated: 73 mg/dL (ref 0–99)
TRIGLYCERIDES: 84 mg/dL (ref 0–149)
VLDL Cholesterol Cal: 17 mg/dL (ref 5–40)

## 2016-07-19 LAB — HEPATIC FUNCTION PANEL
ALBUMIN: 4.4 g/dL (ref 3.5–5.5)
ALT: 16 IU/L (ref 0–44)
AST: 19 IU/L (ref 0–40)
Alkaline Phosphatase: 91 IU/L (ref 39–117)
Bilirubin Total: 0.3 mg/dL (ref 0.0–1.2)
Bilirubin, Direct: 0.09 mg/dL (ref 0.00–0.40)
Total Protein: 7.3 g/dL (ref 6.0–8.5)

## 2016-07-24 ENCOUNTER — Encounter: Payer: Self-pay | Admitting: Internal Medicine

## 2016-07-24 ENCOUNTER — Ambulatory Visit (INDEPENDENT_AMBULATORY_CARE_PROVIDER_SITE_OTHER): Payer: Managed Care, Other (non HMO) | Admitting: Internal Medicine

## 2016-07-24 VITALS — BP 132/86 | HR 68 | Resp 16 | Ht 65.0 in | Wt 115.4 lb

## 2016-07-24 DIAGNOSIS — I208 Other forms of angina pectoris: Secondary | ICD-10-CM | POA: Diagnosis not present

## 2016-07-24 DIAGNOSIS — I2089 Other forms of angina pectoris: Secondary | ICD-10-CM

## 2016-07-24 DIAGNOSIS — M353 Polymyalgia rheumatica: Secondary | ICD-10-CM | POA: Diagnosis not present

## 2016-07-24 MED ORDER — PREDNISONE 10 MG PO TABS: 30.0000 mg | ORAL_TABLET | Freq: Every day | ORAL | 1 refills | Status: DC
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
## 2016-07-25 LAB — SEDIMENTATION RATE: Sed Rate: 10 mm/hr (ref 0–30)

## 2016-07-30 ENCOUNTER — Ambulatory Visit (INDEPENDENT_AMBULATORY_CARE_PROVIDER_SITE_OTHER): Payer: Managed Care, Other (non HMO) | Admitting: Internal Medicine

## 2016-07-30 ENCOUNTER — Encounter: Payer: Self-pay | Admitting: Internal Medicine

## 2016-07-30 VITALS — BP 130/90 | HR 58 | Resp 16 | Ht 65.0 in | Wt 117.0 lb

## 2016-07-30 DIAGNOSIS — J449 Chronic obstructive pulmonary disease, unspecified: Secondary | ICD-10-CM

## 2016-07-30 DIAGNOSIS — R911 Solitary pulmonary nodule: Secondary | ICD-10-CM

## 2016-07-30 MED ORDER — FLUTICASONE-SALMETEROL 250-50 MCG/DOSE IN AEPB: 1.0000 | INHALATION_SPRAY | Freq: Two times a day (BID) | RESPIRATORY_TRACT | 3 refills | Status: DC

## 2016-07-30 MED ORDER — TIOTROPIUM BROMIDE MONOHYDRATE 1.25 MCG/ACT IN AERS: 1.2500 | INHALATION_SPRAY | Freq: Two times a day (BID) | RESPIRATORY_TRACT | 6 refills | Status: DC

## 2016-07-30 MED ORDER — TIOTROPIUM BROMIDE MONOHYDRATE 1.25 MCG/ACT IN AERS: 1.0000 | INHALATION_SPRAY | Freq: Two times a day (BID) | RESPIRATORY_TRACT | 0 refills | Status: DC

## 2016-07-30 NOTE — Patient Instructions (Signed)
Follow-up CT chest in 3 months Start Spiriva Respimat 1.25 Start Advair 250/50 Albuterol as needed Check overnight pulse oximetry  follow-up in 3 months

## 2016-07-30 NOTE — Progress Notes (Signed)
Clinton [facility]      Date: 07/30/2016,   MRN# [id number] Clinton Walsh September 02, 1960 Code Status:  Code Status History    Date Active Date Inactive Code Status Order ID Comments User Context   04/10/2016  4:56 PM 04/12/2016  3:41 PM Full Code [id number]  [name], MD Inpatient   03/16/2011 10:20 PM 03/18/2011  5:45 PM Full Code [id number]  Renee Rival, [place] day:@LENGTHOFSTAYDAYS @ Referring MD: @ATDPROV @     PCP:      Admission                  Current  Clinton Walsh is a 56 y.o. old male seen in [place] for abnormal CT scan at the request of Dr. Janese Banks.     CHIEF COMPLAINT:   Recent heart attack with abnormal CT chest   HISTORY OF PRESENT ILLNESS   56 yr old male with prior h/o CAD in 2013. Patient had recent cardiac catheter and another stent placed in July  Patient currently on ASA and Brilanta threapy  Patient had CT chest to assess for PE/dissection and found to have incidental LUL nodule, patient then had follow up PET scan which showed increased SUV uptake  Repeat CT chest on 06/28/16 shows a stable persistent left upper lobe nodule No significant change in size  Patient has chronic cough and SOB and DOE for many months-progressive worsening over last several months Breo and increases did not help He stopped smoking 11  Months ago  Office spiro shows moderate to severe obstruction ratio48% fev1 2L 63% fef25/75 0.8L 29%  No signs of infection at this time Has occasional cough and intermittent wheezing   No signs of infecti    Current Medication:  Current Outpatient Prescriptions:  .  aspirin EC 81 MG tablet, Take 1 tablet (81 mg total) by mouth daily., Disp: 30 tablet, Rfl: 0 .  atorvastatin (LIPITOR) 80 MG tablet, Take 1 tablet (80 mg total) by mouth daily at 6 PM., Disp: 90 tablet, Rfl: 0 .  carvedilol (COREG) 3.125 MG tablet, Take 1 tablet (3.125 mg total) by mouth 2 (two) times  daily with a meal., Disp: 180 tablet, Rfl: 0 .  fluticasone furoate-vilanterol (BREO ELLIPTA) 200-25 MCG/INH AEPB, Inhale 1 puff into the lungs daily., Disp: 60 each, Rfl: 0 .  lisinopril (PRINIVIL,ZESTRIL) 5 MG tablet, Take 1 tablet (5 mg total) by mouth daily., Disp: 90 tablet, Rfl: 0 .  prasugrel (EFFIENT) 10 MG TABS tablet, Take 1 tablet (10 mg total) by mouth daily., Disp: 90 tablet, Rfl: 3 .  predniSONE (DELTASONE) 10 MG tablet, Take 3 tablets (30 mg total) by mouth daily., Disp: 90 tablet, Rfl: 1 .  ranolazine (RANEXA) 500 MG 12 hr tablet, Take 1 tablet (500 mg total) by mouth 2 (two) times daily., Disp: 180 tablet, Rfl: 3 .  umeclidinium bromide (INCRUSE ELLIPTA) 62.5 MCG/INH AEPB, Inhale 1 puff into the lungs daily., Disp: 30 each, Rfl: 0    ALLERGIES   Sulfa antibiotics     REVIEW OF SYSTEMS   Review of Systems  Constitutional: Positive for malaise/fatigue. Negative for chills, diaphoresis, fever and weight loss.  HENT: Negative for congestion and hearing loss.   Eyes: Negative for blurred vision and double vision.  Respiratory: Positive for cough and shortness of breath. Negative for hemoptysis, sputum production and wheezing.   Cardiovascular: Negative for chest pain, palpitations and orthopnea.  Gastrointestinal: Negative for heartburn.  Skin:  Negative for rash.  Neurological: Negative for weakness.  All other systems reviewed and are negative.  BP 130/90 (BP Location: Left Arm, Cuff Size: Normal)   Pulse (!) 58   Resp 16   Ht 5\' 5"  (1.651 m)   Wt 117 lb (53.1 kg)   SpO2 100%   BMI 19.47 kg/m     PHYSICAL EXAM  Physical Exam  Constitutional: He is oriented to person, place, and time. He appears well-developed and well-nourished. No distress.  Cardiovascular: Normal rate, regular rhythm and normal heart sounds.   No murmur heard. Pulmonary/Chest: Effort normal and breath sounds normal. No stridor. No respiratory distress. He has no wheezes.  Musculoskeletal:  Normal range of motion. He exhibits no edema.  Neurological: He is alert and oriented to person, place, and time. No cranial nerve deficit.  Skin: Skin is warm. He is not diaphoretic.  Psychiatric: He has a normal mood and affect.          IMAGING    No results found.    I have Independently reviewed images of  PET scan on 04/26/16   on 07/30/2016 Interpretation:LUL nodular scariikng with SUV 2.7 mediastinal adenopathy  I have independently reviewed the images of CT scan on 06/28/16 on 07/30/16 which showed persistent left upper lobe nodular scarring that has not changed in size    ASSESSMENT/PLAN   56 yo thin white male with h/o acute MI with incidental finding of LUL nodule and adenopathy with moderate to severe COPD with Gold Stage D, Patient will need ENB and EBUS for further diagnostic testing however, this is a complicated matter with is underlying medical conditions  Patient is very high risk for post op pulmonary complications from underlying CAD and COPD  The Risks and Benefits of the Bronchoscopy with EBUS/ENB were explained to patient and I have discussed the risk for acute bleeding, increased chance of infection, increased chance of respiratory failure and cardiac arrest and death. I have also explained to avoid all types of NSAIDs to decrease chance of bleeding, and to avoid food and drinks the midnight prior to procedure.  The procedure consists of a video camera with a light source to be placed and inserted  into the lungs to  look for abnormal tissue and to obtain tissue samples by using needle and biopsy tools.   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 All questions answered  Corrin Parker, M.D.  [facility]  Medical Director [place] Director [facility] Cardio-Pulmonary Department

## 2016-07-30 NOTE — Addendum Note (Signed)
Addended by: Oscar La R on: 07/30/2016 01:59 PM   Modules accepted: Orders

## 2016-07-31 ENCOUNTER — Other Ambulatory Visit: Payer: Self-pay | Admitting: Internal Medicine

## 2016-07-31 ENCOUNTER — Ambulatory Visit
Admission: RE | Admit: 2016-07-31 | Discharge: 2016-07-31 | Disposition: A | Discharge status: Home / Self Care | Payer: Disability Insurance | Source: Ambulatory Visit | Attending: Internal Medicine | Admitting: Internal Medicine

## 2016-07-31 DIAGNOSIS — M4850XA Collapsed vertebra, not elsewhere classified, site unspecified, initial encounter for fracture: Secondary | ICD-10-CM | POA: Diagnosis present

## 2016-07-31 DIAGNOSIS — Z87828 Personal history of other (healed) physical injury and trauma: Secondary | ICD-10-CM

## 2016-07-31 DIAGNOSIS — I700 Atherosclerosis of aorta: Secondary | ICD-10-CM | POA: Insufficient documentation

## 2016-07-31 DIAGNOSIS — M4856XA Collapsed vertebra, not elsewhere classified, lumbar region, initial encounter for fracture: Secondary | ICD-10-CM | POA: Diagnosis not present

## 2016-07-31 DIAGNOSIS — IMO0001 Reserved for inherently not codable concepts without codable children: Secondary | ICD-10-CM

## 2016-07-31 DIAGNOSIS — M170 Bilateral primary osteoarthritis of knee: Secondary | ICD-10-CM | POA: Diagnosis not present

## 2016-07-31 DIAGNOSIS — M8588 Other specified disorders of bone density and structure, other site: Secondary | ICD-10-CM | POA: Insufficient documentation

## 2016-08-01 ENCOUNTER — Encounter: Payer: Self-pay | Admitting: Internal Medicine

## 2016-08-02 ENCOUNTER — Encounter: Payer: Self-pay | Admitting: Internal Medicine

## 2016-08-02 DIAGNOSIS — J449 Chronic obstructive pulmonary disease, unspecified: Secondary | ICD-10-CM

## 2016-08-08 ENCOUNTER — Telehealth: Payer: Self-pay | Admitting: Internal Medicine

## 2016-08-08 DIAGNOSIS — J431 Panlobular emphysema: Secondary | ICD-10-CM

## 2016-08-08 NOTE — Telephone Encounter (Signed)
Patient aware of results ONO. Orders placed for 02 qhs.

## 2016-08-24 ENCOUNTER — Encounter: Payer: Self-pay | Admitting: Internal Medicine

## 2016-08-24 ENCOUNTER — Ambulatory Visit (INDEPENDENT_AMBULATORY_CARE_PROVIDER_SITE_OTHER): Payer: Managed Care, Other (non HMO) | Admitting: Internal Medicine

## 2016-08-24 VITALS — BP 112/78 | HR 55 | Ht 65.0 in | Wt 116.0 lb

## 2016-08-24 DIAGNOSIS — M25512 Pain in left shoulder: Secondary | ICD-10-CM

## 2016-08-24 DIAGNOSIS — M25511 Pain in right shoulder: Secondary | ICD-10-CM | POA: Diagnosis not present

## 2016-08-24 DIAGNOSIS — J431 Panlobular emphysema: Secondary | ICD-10-CM | POA: Diagnosis not present
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
## 2016-08-24 NOTE — Patient Instructions (Signed)
Start Tylenol (or acetaminophen) 650 mg up to four times per day for shoulder and joint pains  Call back in a month to let me know how you are doing.

## 2016-08-27 ENCOUNTER — Other Ambulatory Visit: Payer: Self-pay | Admitting: Preventative Medicine

## 2016-08-27 DIAGNOSIS — J449 Chronic obstructive pulmonary disease, unspecified: Secondary | ICD-10-CM

## 2016-09-20 ENCOUNTER — Ambulatory Visit: Payer: Disability Insurance | Attending: Preventative Medicine

## 2016-09-20 DIAGNOSIS — J449 Chronic obstructive pulmonary disease, unspecified: Secondary | ICD-10-CM | POA: Diagnosis present

## 2016-09-26 ENCOUNTER — Telehealth: Payer: Self-pay | Admitting: Internal Medicine

## 2016-09-26 NOTE — Telephone Encounter (Signed)
Received records request Disability Determination Services forwarded to [facility] for processing.

## 2016-10-16 ENCOUNTER — Ambulatory Visit
Admission: RE | Admit: 2016-10-16 | Discharge: 2016-10-16 | Disposition: A | Discharge status: Home / Self Care | Payer: Managed Care, Other (non HMO) | Source: Ambulatory Visit | Attending: Internal Medicine | Admitting: Internal Medicine

## 2016-10-16 DIAGNOSIS — J432 Centrilobular emphysema: Secondary | ICD-10-CM | POA: Insufficient documentation

## 2016-10-16 DIAGNOSIS — R911 Solitary pulmonary nodule: Secondary | ICD-10-CM | POA: Diagnosis not present

## 2016-10-16 DIAGNOSIS — I712 Thoracic aortic aneurysm, without rupture: Secondary | ICD-10-CM | POA: Diagnosis not present

## 2016-10-16 DIAGNOSIS — I700 Atherosclerosis of aorta: Secondary | ICD-10-CM | POA: Insufficient documentation

## 2016-10-18 ENCOUNTER — Ambulatory Visit (INDEPENDENT_AMBULATORY_CARE_PROVIDER_SITE_OTHER): Payer: Managed Care, Other (non HMO) | Admitting: Internal Medicine

## 2016-10-18 ENCOUNTER — Encounter: Payer: Self-pay | Admitting: Internal Medicine

## 2016-10-18 VITALS — BP 128/84 | HR 63 | Resp 16 | Ht 65.0 in | Wt 116.0 lb

## 2016-10-18 DIAGNOSIS — R918 Other nonspecific abnormal finding of lung field: Secondary | ICD-10-CM

## 2016-10-18 DIAGNOSIS — J449 Chronic obstructive pulmonary disease, unspecified: Secondary | ICD-10-CM

## 2016-10-18 NOTE — Progress Notes (Signed)
[facility] Consultation      Date: 10/18/2016,   MRN# [id number] Clinton Walsh 1960/04/09 Code Status:  Code Status History    Date Active Date Inactive Code Status Order ID Comments User Context   04/10/2016  4:56 PM 04/12/2016  3:41 PM Full Code [id number]  Clinton Bush, MD Inpatient   03/16/2011 10:20 PM 03/18/2011  5:45 PM Full Code [id number]  Clinton Walsh, [place] day:@LENGTHOFSTAYDAYS @ Referring MD: @ATDPROV @     PCP:      AdmissionWeight: 116 lb (52.6 kg)                 CurrentWeight: 116 lb (52.6 kg) Clinton Walsh is a 56 y.o. old male seen in consultation for abnormal CT scan at the request of Dr. Janese Walsh.     CHIEF COMPLAINT:   Recent heart attack with abnormal CT chest   SYNPOSIS   56 yr old male with prior h/o CAD in 2013. Patient had recent cardiac catheter and another stent placed in July Patient currently on ASA and Brilanta threapy  Patient had CT chest to assess for PE/dissection and found to have incidental LUL nodule, patient then had follow up PET scan which showed increased SUV uptake  Repeat CT chest on 06/28/16 shows a stable persistent left upper lobe nodule No significant change in size    HPI  Repeat CT chest 10/16/16 shows stable LUL nodular scarring No change in size  Patient with chronic wheezing, SOB and DOE Patient still smokes and vapes   progressive worsening over last several months  Started on advair and Spiriva a little  Office spiro shows moderate to severe obstruction ratio48% fev1 2L 63% fef25/75 0.8L 29% ONO+hypcoxia  Patient wearing 2 L [place] at night No signs of infection at this time Will need to assess for hypoxia with exertion   No signs of infecti    Current Medication:  Current Outpatient Prescriptions:  .  aspirin EC 81 MG tablet, Take 1 tablet (81 mg total) by mouth daily., Disp: 30 tablet, Rfl: 0 .  atorvastatin (LIPITOR) 80 MG tablet, Take 1 tablet  (80 mg total) by mouth daily at 6 PM., Disp: 90 tablet, Rfl: 0 .  carvedilol (COREG) 3.125 MG tablet, Take 1 tablet (3.125 mg total) by mouth 2 (two) times daily with a meal., Disp: 180 tablet, Rfl: 0 .  Fluticasone-Salmeterol (ADVAIR DISKUS) 250-50 MCG/DOSE AEPB, Inhale 1 puff into the lungs 2 (two) times daily., Disp: 1 each, Rfl: 3 .  lisinopril (PRINIVIL,ZESTRIL) 5 MG tablet, Take 1 tablet (5 mg total) by mouth daily., Disp: 90 tablet, Rfl: 0 .  ranolazine (RANEXA) 500 MG 12 hr tablet, Take 1 tablet (500 mg total) by mouth 2 (two) times daily., Disp: 180 tablet, Rfl: 3 .  Tiotropium Bromide Monohydrate (SPIRIVA RESPIMAT) 1.25 MCG/ACT AERS, Inhale 1.25 Act into the lungs 2 (two) times daily., Disp: 1 Inhaler, Rfl: 6 .  prasugrel (EFFIENT) 10 MG TABS tablet, Take 10 mg by mouth daily., Disp: , Rfl:     ALLERGIES   Sulfa antibiotics     REVIEW OF SYSTEMS   Review of Systems  Constitutional: Positive for malaise/fatigue. Negative for chills, diaphoresis, fever and weight loss.  HENT: Negative for congestion and hearing loss.   Eyes: Negative for blurred vision and double vision.  Respiratory: Positive for cough and shortness of breath. Negative for hemoptysis, sputum production and wheezing.   Cardiovascular: Negative for chest pain,  palpitations and orthopnea.  Gastrointestinal: Negative for heartburn.  Skin: Negative for rash.  Neurological: Negative for weakness.  All other systems reviewed and are negative.  BP 128/84 (BP Location: Left Arm, Cuff Size: Normal)   Pulse 63   Resp 16   Ht 5\' 5"  (1.651 m)   Wt 116 lb (52.6 kg)   SpO2 96%   BMI 19.30 kg/m     PHYSICAL EXAM  Physical Exam  Constitutional: He is oriented to person, place, and time. He appears well-developed and well-nourished. No distress.  Cardiovascular: Normal rate, regular rhythm and normal heart sounds.   No murmur heard. Pulmonary/Chest: Effort normal and breath sounds normal. No stridor. No  respiratory distress. He has no wheezes.  Musculoskeletal: Normal range of motion. He exhibits no edema.  Neurological: He is alert and oriented to person, place, and time. No cranial nerve deficit.  Skin: Skin is warm. He is not diaphoretic.  Psychiatric: He has a normal mood and affect.          IMAGING       I have Independently reviewed images of  PET scan on 04/26/16    Interpretation:LUL nodular scarring with SUV 2.7 mediastinal adenopathy  I have independently reviewed the images of CT scan on 06/28/16 on 07/30/16 which showed persistent left upper lobe nodular scarring that has not changed in size   Follow up CT chest 10/16/16 -Stable 2.6 cm left upper lobe pulmonary nodule compared to prior studies dating back to 6 months ago.  -stable mildly enlarged mediastinal lymph nodes.   ONO +hypoxia  ASSESSMENT/PLAN   56 yo thin white male with h/o acute MI with incidental finding of LUL nodule and adenopathy with moderate to severe COPD with Gold Stage D, Patient will need ENB and EBUS for further diagnostic testing however, this is a complicated matter with is underlying medical conditions so I have been following his nodule with surveillance CT scans and it seems that the LUL nodule has not significantly changed over last 6 months  Patient is very high risk for post op pulmonary complications from underlying CAD and COPD    #1 moderate to severe COPD Gold stage D -Spiriva therapy 1.25 Respimat -Advair 250/50 Albuterol as needed  #2 chronic shortness of breath and dyspnea on exertion in deconditioned state Related to his congestive heart failure severe coronary artery disease in his underlying COPD Will need to assess his oxygen levels at with exertion Will need to repeat 6MWT at this time Previous 6 minute walk on 05/01/2016 shows no ambulating hypoxia  #3 deconditioned state Exercise as tolerated  #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 All questions answered  Clinton Walsh, M.D.  [facility]  Medical Director [place] Director [facility] Cardio-Pulmonary Department

## 2016-10-18 NOTE — Patient Instructions (Signed)
Continue  inhalers as prescribed Oxygen as prescribed Check 6MWT Follow up CT chest in 6 months  STOP SMOKING AND VAPING!!

## 2016-10-23 ENCOUNTER — Ambulatory Visit: Payer: Managed Care, Other (non HMO) | Admitting: Internal Medicine

## 2016-10-24 ENCOUNTER — Encounter: Payer: Self-pay | Admitting: Internal Medicine

## 2016-10-26 ENCOUNTER — Ambulatory Visit: Payer: Managed Care, Other (non HMO)

## 2016-10-26 ENCOUNTER — Telehealth: Payer: Self-pay | Admitting: *Deleted

## 2016-10-26 NOTE — Telephone Encounter (Signed)
smw cancelled

## 2016-12-12 ENCOUNTER — Ambulatory Visit: Payer: Managed Care, Other (non HMO) | Admitting: Internal Medicine

## 2016-12-12 ENCOUNTER — Telehealth: Payer: Self-pay | Admitting: Internal Medicine

## 2016-12-12 NOTE — Telephone Encounter (Signed)
L mom to r/s appointment from snow day

## 2017-04-03 ENCOUNTER — Ambulatory Visit
Admission: RE | Admit: 2017-04-03 | Discharge: 2017-04-03 | Disposition: A | Discharge status: Home / Self Care | Payer: Managed Care, Other (non HMO) | Source: Ambulatory Visit | Attending: Internal Medicine | Admitting: Internal Medicine

## 2017-04-03 DIAGNOSIS — R590 Localized enlarged lymph nodes: Secondary | ICD-10-CM | POA: Insufficient documentation

## 2017-04-03 DIAGNOSIS — R918 Other nonspecific abnormal finding of lung field: Secondary | ICD-10-CM | POA: Diagnosis not present

## 2017-04-03 DIAGNOSIS — I700 Atherosclerosis of aorta: Secondary | ICD-10-CM | POA: Insufficient documentation

## 2017-04-03 DIAGNOSIS — I712 Thoracic aortic aneurysm, without rupture: Secondary | ICD-10-CM | POA: Insufficient documentation

## 2017-04-03 DIAGNOSIS — J439 Emphysema, unspecified: Secondary | ICD-10-CM | POA: Insufficient documentation

## 2017-04-03 MED ORDER — IOHEXOL 300 MG/ML  SOLN
75.0000 mL | Freq: Once | INTRAMUSCULAR | Status: AC | PRN
  Administered 2017-04-03: 75 mL via INTRAVENOUS

## 2017-04-08 ENCOUNTER — Telehealth: Payer: Self-pay | Admitting: Internal Medicine

## 2017-04-08 NOTE — Telephone Encounter (Signed)
Patient r/s appt with Dr. Mortimer Fries but wants to talk about what was seen on most recent images   Please call.

## 2017-04-09 ENCOUNTER — Ambulatory Visit: Payer: Managed Care, Other (non HMO) | Admitting: Internal Medicine

## 2017-05-02 ENCOUNTER — Ambulatory Visit (INDEPENDENT_AMBULATORY_CARE_PROVIDER_SITE_OTHER): Payer: Managed Care, Other (non HMO) | Admitting: Internal Medicine

## 2017-05-02 ENCOUNTER — Encounter: Payer: Self-pay | Admitting: Internal Medicine

## 2017-05-02 ENCOUNTER — Telehealth: Payer: Self-pay | Admitting: Internal Medicine

## 2017-05-02 VITALS — BP 122/80 | HR 57 | Ht 64.0 in | Wt 116.0 lb

## 2017-05-02 DIAGNOSIS — J449 Chronic obstructive pulmonary disease, unspecified: Secondary | ICD-10-CM | POA: Diagnosis not present

## 2017-05-02 DIAGNOSIS — F17210 Nicotine dependence, cigarettes, uncomplicated: Secondary | ICD-10-CM | POA: Diagnosis not present

## 2017-05-02 DIAGNOSIS — Z716 Tobacco abuse counseling: Secondary | ICD-10-CM

## 2017-05-02 MED ORDER — BUDESONIDE 0.5 MG/2ML IN SUSP: 0.5000 mg | Freq: Two times a day (BID) | RESPIRATORY_TRACT | 6 refills | Status: DC

## 2017-05-02 MED ORDER — FORMOTEROL FUMARATE 20 MCG/2ML IN NEBU: 20.0000 ug | INHALATION_SOLUTION | Freq: Two times a day (BID) | RESPIRATORY_TRACT | 6 refills | Status: DC

## 2017-05-02 NOTE — Telephone Encounter (Signed)
Patient calling to get an order sent to [facility] so they have permission to pick up unwanted equipment .  Please call to advise patient .

## 2017-05-02 NOTE — Progress Notes (Signed)
Clinton Walsh      Date: Walsh,   Clinton Walsh 1960/03/10     AdmissionWeight: 116 lb (52.6 kg)                 CurrentWeight: 116 lb (52.6 kg) Clinton Walsh is a 57 y.o. old male seen in [place] for abnormal CT scan at the request of Dr. Janese Banks.     CHIEF COMPLAINT:    abnormal CT chest Severe COPD   SYNPOSIS   57 yr old male with prior h/o CAD in 2013. Patient had previous  cardiac catheter and another stent placed in July Patient currently on ASA and Brilanta threapy  Patient had CT chest to assess for PE/dissection and found to have incidental LUL nodule, patient then had follow up PET scan which showed increased SUV uptake  Repeat CT chest on 06/28/16 shows a stable persistent left upper lobe nodule No significant change in size  Repeat CT chest 04/03/2017 Shows stable nodules No increase in size Severe emphysema noted   HPI Repeat CT chest 4.2019  shows stable LUL nodular scarring No change in size  Patient with chronic wheezing, SOB and DOE Patient still smokes and vapes   progressive worsening over last several months  Started on advair and Spiriva a little Will need neb therapy  Office spiro shows moderate to severe obstruction ratio48% fev1 2L 63% fef25/75 0.8L 29% ONO+hypcoxia  Patient wearing 2 L [place] at night No signs of infection at this time     No signs of infecti    Current Medication:  Current Outpatient Medications:  .  aspirin EC 81 MG tablet, Take 1 tablet (81 mg total) by mouth daily., Disp: 30 tablet, Rfl: 0 .  atorvastatin (LIPITOR) 80 MG tablet, Take 1 tablet (80 mg total) by mouth daily at 6 PM., Disp: 90 tablet, Rfl: 0 .  carvedilol (COREG) 3.125 MG tablet, Take 1 tablet (3.125 mg total) by mouth 2 (two) times daily with a meal., Disp: 180 tablet, Rfl: 0 .  Fluticasone-Salmeterol (ADVAIR DISKUS) 250-50 MCG/DOSE AEPB, Inhale 1 puff into the lungs 2 (two)  times daily., Disp: 1 each, Rfl: 3 .  lisinopril (PRINIVIL,ZESTRIL) 5 MG tablet, Take 1 tablet (5 mg total) by mouth daily., Disp: 90 tablet, Rfl: 0 .  prasugrel (EFFIENT) 10 MG TABS tablet, Take 10 mg by mouth daily., Disp: , Rfl:  .  ranolazine (RANEXA) 500 MG 12 hr tablet, Take 1 tablet (500 mg total) by mouth 2 (two) times daily., Disp: 180 tablet, Rfl: 3 .  Tiotropium Bromide Monohydrate (SPIRIVA RESPIMAT) 1.25 MCG/ACT AERS, Inhale 1.25 Act into the lungs 2 (two) times daily., Disp: 1 Inhaler, Rfl: 6    ALLERGIES   Sulfa antibiotics     REVIEW OF SYSTEMS   Review of Systems  Constitutional: Positive for malaise/fatigue. Negative for chills, diaphoresis, fever and weight loss.  HENT: Negative for congestion and hearing loss.   Eyes: Negative for blurred vision and double vision.  Respiratory: Positive for cough and shortness of breath. Negative for hemoptysis, sputum production and wheezing.   Cardiovascular: Negative for chest pain, palpitations and orthopnea.  Gastrointestinal: Negative for heartburn.  Skin: Negative for rash.  Neurological: Negative for weakness.  All other systems reviewed and are negative.  BP 122/80 (BP Location: Left Arm, Cuff Size: Normal)   Pulse (!) 57   Ht 5\' 4"  (1.626 m)   Wt 116 lb (52.6 kg)   SpO2 94%  BMI 19.91 kg/m     PHYSICAL EXAM  Physical Exam  Constitutional: He is oriented to person, place, and time. He appears well-developed and well-nourished. No distress.  Cardiovascular: Normal rate, regular rhythm and normal heart sounds.  No murmur heard. Pulmonary/Chest: Effort normal and breath sounds normal. No stridor. No respiratory distress. He has no wheezes.  Musculoskeletal: Normal range of motion. He exhibits no edema.  Neurological: He is alert and oriented to person, place, and time. No cranial nerve deficit.  Skin: Skin is warm. He is not diaphoretic.  Psychiatric: He has a normal mood and affect.          IMAGING     I have Independently reviewed images of  PET scan on 04/26/16    Interpretation:LUL nodular scarring with SUV 2.7 mediastinal adenopathy  I have independently reviewed the images of CT scan on 06/28/16 on 07/30/16 which showed persistent left upper lobe nodular scarring that has not changed in size   Follow up CT chest 10/16/16 -Stable 2.6 cm left upper lobe pulmonary nodule compared to prior studies dating back to 6 months ago.  -stable mildly enlarged mediastinal lymph nodes.   ONO +hypoxia  Follow-up CT chest 04/20/2017 Stable nodule stable adenopathy   ASSESSMENT/PLAN   57 yo thin white male with h/o acute MI with incidental finding of LUL nodule and adenopathy with moderate to severe COPD with Gold Stage D, Patient will need ENB and EBUS for further diagnostic testing however, this is a complicated matter with is underlying medical conditions so I have been following his nodule with surveillance CT scans and it seems that the LUL nodule has not significantly changed over last 1 year  Patient is very high risk for post op pulmonary complications from underlying CAD and COPD    #1 moderate to severe COPD Gold stage D Patient states Advair is not working anymore we will need to prescribe Perforomist and Pulmicort nebs twice daily -Continue Spiriva therapy 1.25 Respimat Albuterol as needed-however patient states this is not working either  #2 chronic shortness of breath and dyspnea on exertion in deconditioned state Related to his congestive heart failure severe coronary artery disease in his underlying COPD  #3 deconditioned state Exercise as tolerated  #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 -Stable mediastinal and hilar lymphadenopathy with speckled calcifications, suspicious for sarcoidosis. -stable right middle lobe and right  lower lobe pulmonary nodules. No new pulmonary nodules. -Severe emphysema. 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   #6 smoking cessation strongly advised and also needs to stop Vaping Discussion for approx 5 minutes    Smoking Assessment and Cessation Counseling   Upon further questioning, Patient smokes 1/2 ppd  I have advised patient to quit/stop smoking as soon as possible due to high risk for multiple medical problems  Patient  is NOT willing to quit smoking  I have advised patient that we can assist and have options of Nicotine replacement therapy. I also advised patient on behavioral therapy and can provide oral medication therapy in conjunction with the other therapies  Follow up next Office visit  for assessment of smoking cessation  Smoking cessation counseling advised for 5 minutes      Patient satisfied with Plan of action and management. All questions answered  Corrin Parker, M.D.  [facility]  Medical  Director [place] Director [facility] Cardio-Pulmonary Department

## 2017-05-02 NOTE — Patient Instructions (Addendum)
START PULMICORT AND PEFORMIST NEBS TWICE DAILY STOP SMOKING  FOLLOW UP CARDIOLOGY

## 2017-05-06 NOTE — Telephone Encounter (Signed)
Per DK, place order to d/c oxygen since pt refuses to use.

## 2017-05-06 NOTE — Telephone Encounter (Signed)
Pt was seen 05/02/17 by DK and images were discussed.

## 2017-05-06 NOTE — Telephone Encounter (Signed)
Order placed

## 2017-06-21 ENCOUNTER — Other Ambulatory Visit: Payer: Self-pay | Admitting: Internal Medicine

## 2017-06-21 DIAGNOSIS — J449 Chronic obstructive pulmonary disease, unspecified: Secondary | ICD-10-CM

## 2017-10-23 ENCOUNTER — Encounter: Payer: Self-pay | Admitting: Internal Medicine

## 2017-10-23 ENCOUNTER — Other Ambulatory Visit: Payer: Self-pay | Admitting: Internal Medicine

## 2017-10-23 ENCOUNTER — Ambulatory Visit (INDEPENDENT_AMBULATORY_CARE_PROVIDER_SITE_OTHER): Payer: Managed Care, Other (non HMO) | Admitting: Internal Medicine

## 2017-10-23 VITALS — BP 130/90 | HR 60 | Ht 65.0 in | Wt 117.5 lb

## 2017-10-23 DIAGNOSIS — I255 Ischemic cardiomyopathy: Secondary | ICD-10-CM | POA: Diagnosis not present

## 2017-10-23 DIAGNOSIS — G459 Transient cerebral ischemic attack, unspecified: Secondary | ICD-10-CM | POA: Diagnosis not present

## 2017-10-23 DIAGNOSIS — I5022 Chronic systolic (congestive) heart failure: Secondary | ICD-10-CM | POA: Diagnosis not present

## 2017-10-23 DIAGNOSIS — E785 Hyperlipidemia, unspecified: Secondary | ICD-10-CM

## 2017-10-23 DIAGNOSIS — I2510 Atherosclerotic heart disease of native coronary artery without angina pectoris: Secondary | ICD-10-CM | POA: Diagnosis not present

## 2017-10-23 MED ORDER — ATORVASTATIN CALCIUM 20 MG PO TABS: 20.0000 mg | ORAL_TABLET | Freq: Every day | ORAL | 3 refills | Status: DC

## 2017-10-23 NOTE — Patient Instructions (Signed)
Medication Instructions:  Your physician has recommended you make the following change in your medication:  1- TAKE Atorvastatin 20 mg by mouth once a day. 2- TAKE Aspirin 81 mg by mouth once a day.  If you need a refill on your cardiac medications before your next appointment, please call your pharmacy.   Lab work: Your physician recommends that you return for lab work in: TODAY  - CBC, CMP, LIPID.  If you have labs (blood work) drawn today and your tests are completely normal, you will receive your results only by: Marland Kitchen MyChart Message (if you have MyChart) OR . A paper copy in the mail If you have any lab test that is abnormal or we need to change your treatment, we will call you to review the results.  Testing/Procedures: Your physician has requested that you have an echocardiogram. Echocardiography is a painless test that uses sound waves to create images of your heart. It provides your doctor with information about the size and shape of your heart and how well your heart's chambers and valves are working. This procedure takes approximately one hour. There are no restrictions for this procedure. You may get an IV, if needed, to receive an ultrasound enhancing agent through to better visualize your heart.    Your physician has requested that you have a carotid duplex. This test is an ultrasound of the carotid arteries in your neck. It looks at blood flow through these arteries that supply the brain with blood. Allow one hour for this exam. There are no restrictions or special instructions.   Follow-Up: At [facility], you and your health needs are our priority.  As part of our continuing mission to provide you with exceptional heart care, we have created designated Provider Care Teams.  These Care Teams include your primary Cardiologist (physician) and Advanced Practice Providers (APPs -  Physician Assistants and Nurse Practitioners) who all work together to provide you with the care you  need, when you need it. You will need a follow up appointment in 6-8 weeks.  You may see DR Harrell Gave END or one of the following Advanced Practice Providers on your designated Care Team:   Murray Hodgkins, NP Christell Faith, PA-C . Marrianne Mood, PA-C    Echocardiogram An echocardiogram, or echocardiography, uses sound waves (ultrasound) to produce an image of your heart. The echocardiogram is simple, painless, obtained within a short period of time, and offers valuable information to your health care provider. The images from an echocardiogram can provide information such as:  Evidence of coronary artery disease (CAD).  Heart size.  Heart muscle function.  Heart valve function.  Aneurysm detection.  Evidence of a past heart attack.  Fluid buildup around the heart.  Heart muscle thickening.  Assess heart valve function.  Tell a health care provider about:  Any allergies you have.  All medicines you are taking, including vitamins, herbs, eye drops, creams, and over-the-counter medicines.  Any problems you or family members have had with anesthetic medicines.  Any blood disorders you have.  Any surgeries you have had.  Any medical conditions you have.  Whether you are pregnant or may be pregnant. What happens before the procedure? No special preparation is needed. Eat and drink normally. What happens during the procedure?  In order to produce an image of your heart, gel will be applied to your chest and a wand-like tool (transducer) will be moved over your chest. The gel will help transmit the sound waves from the  transducer. The sound waves will harmlessly bounce off your heart to allow the heart images to be captured in real-time motion. These images will then be recorded.  You may need an IV to receive a medicine that improves the quality of the pictures. What happens after the procedure? You may return to your normal schedule including diet, activities, and  medicines, unless your health care provider tells you otherwise. This information is not intended to replace advice given to you by your health care provider. Make sure you discuss any questions you have with your health care provider. Document Released: 12/16/1999 Document Revised: 08/06/2015 Document Reviewed: 08/25/2012 Elsevier Interactive Patient Education  2017 [employer].

## 2017-10-23 NOTE — Progress Notes (Signed)
Follow-up Outpatient Visit Date: 10/23/2017  Primary Care Provider: Glean Hess, MD [address] [place] [address]  Chief Complaint: Follow-up coronary artery disease  HPI:  Clinton [facility] is a 57 y.o. year-old male with history of coronary artery disease status post PCI to diagonal in 2229 complicated by acute stent thrombosis, high risk NSTEMI with subtotal thrombotic occlusion of the proximal/mid RCA in 04/2016, and FFR-guided PCI to mid LAD due to recurrent angina in 07/2016, ischemic cardiomyopathy, lung mass (presumed cancer) followed by pulmonology, and prior tobacco use, who presents for follow-up of coronary artery disease.  He was last seen in our office over a year ago (07/2016) by Clinton Bayley, NP.  At that time he reported resolution of chest pain following PCI to the mid LAD.  Chronic exertional dyspnea persisted.  He continues to follow with Clinton [facility] (pulmonary) regarding his severe COPD and left upper lobe lung nodule (stable by CT).  Today, Clinton [facility] reports that he is feeling about the same.  He notes occasional fleeting chest pains that are sharp and migrate throughout his chest.  These have been long-standing and are different than the pain he experienced in the past with his MI's.  He has stable exertional dyspnea.  He denies palpitations, lightheadedness, and edema.  A few weeks ago, he experienced transient numbness of the right side of his face as well as a right-sided facial droop.  This resolved after a few minutes.  Later the same day, he had marked weakness in the right arm, which also resolved after a few minutes.  This prompted him to restart taking aspirin.  He notes that he has stopped all of his cardiac medications this summer.  He continues to smoke 5 cigarettes/day but has stopped vaping.  He is not interested in quitting  cigarettes.  --------------------------------------------------------------------------------------------------  Cardiovascular History & Procedures: Cardiovascular Problems:  Coronary artery disease  Ischemic cardiomyopathy  Risk Factors:  Known coronary artery disease, male gender, and history of tobacco use  Cath/PCI:  LHC/PCI (07/03/16): LMCA normal.  LAD with 30% proximal and 60% mid stenosis.  LCx with 50% mid stenosis and OM 2 with 70% stenosis.  RCA with 30% ostial disease and widely patent proximal/mid stent.  FFR of mid LAD was 0.78.  Mid LAD successfully treated with Xience Alpine 2.5 x 23 mm drug-eluting stent.  LVEF 35 to 40% with diffuse mid and apical hypokinesis.  LHC/PCI (04/10/16): LMCA normal. LAD with 30% mid vessel disease. D1 with 30% ostial stenosis and patent proximal stent with 20% in-stent restenosis. LCx with 50% proximal/mid disease as well as 40% proximal OM 2 stenosis. Large, dominant RCA with 30% ostial and 90% proximal/mid stenosis with intraluminal thrombus. Successful IVUS-guided PCI to the proximal/mid RCA with placement of a Resolute Integrity 3.0 x 34 mm drug-eluting stent. LVEDP 13 mmHg  CV Surgery:  None  EP Procedures and Devices:  None  Non-Invasive Evaluation(s):  TTE (04/10/16): Normal LV size with LVEF of 30-35% and hypokinesis of the inferior and inferolateral myocardium. Grade 1 diastolic dysfunction. Trivial aortic regurgitation. Mild to moderate mitral regurgitation. Mildly reduced right ventricular contraction. Normal pulmonary artery pressure.  Recent CV Pertinent Labs: Lab Results  Component Value Date   CHOL 123 07/18/2016   CHOL 192 04/14/2011   HDL 33 (L) 07/18/2016   HDL 31 (L) 04/14/2011   LDLCALC 73 07/18/2016   LDLCALC 134 (H) 04/14/2011   TRIG 84 07/18/2016   TRIG 137 04/14/2011   CHOLHDL  3.7 07/18/2016   CHOLHDL 5.4 04/11/2016   INR 1.00 06/27/2016   K 3.8 07/04/2016   K 3.9 09/19/2011   MG 1.8 04/15/2011    BUN 14 07/04/2016   BUN 14 08/29/2015   BUN 14 09/19/2011   CREATININE 1.08 07/04/2016   CREATININE 0.97 09/19/2011    Past medical and surgical history were reviewed and updated in EPIC.  Current Meds  Medication Sig  . aspirin EC 81 MG tablet Take 1 tablet (81 mg total) by mouth daily.  . budesonide (PULMICORT) 0.5 MG/2ML nebulizer solution TAKE 2 MLS (0.5 MG TOTAL) BY NEBULIZATION 2 (TWO) TIMES DAILY. DX: COPD J44.9  . Fluticasone-Salmeterol (ADVAIR DISKUS) 250-50 MCG/DOSE AEPB Inhale 1 puff into the lungs 2 (two) times daily.  . formoterol (PERFOROMIST) 20 MCG/2ML nebulizer solution Take 2 mLs (20 mcg total) by nebulization 2 (two) times daily. DX: COPD J44.9  . Tiotropium Bromide Monohydrate (SPIRIVA RESPIMAT) 1.25 MCG/ACT AERS Inhale 1.25 Act into the lungs 2 (two) times daily.    Allergies: Sulfa antibiotics  Social History   Tobacco Use  . Smoking status: Current Every Day Smoker    Packs/day: 0.25    Years: 30.00    Pack years: 7.50    Types: E-cigarettes, Cigarettes    Last attempt to quit: 07/2015    Years since quitting: 2.3  . Smokeless tobacco: Never Used  . Tobacco comment: quit 7/17, uses vap cigarettes now 6 mg nicotine-just dropped to 3 mg " to make heart Dr happy"per pt  Substance Use Topics  . Alcohol use: No    Alcohol/week: 0.0 standard drinks    Comment: none x 10y  . Drug use: No    Family History  Problem Relation Age of Onset  . Other Father        "Heart problems" pacemaker  . Prostate cancer Father   . Heart attack Father   . Other Mother        "heart problems"  . Heart attack Mother   . Other Maternal Grandfather        "heart exploded" in his 49s    Review of Systems: A 12-system review of systems was performed and was negative except as noted in the HPI.  --------------------------------------------------------------------------------------------------  Physical Exam: BP 130/90 (BP Location: Left Arm, Patient Position: Sitting,  Cuff Size: Normal)   Pulse 60   Ht 5\' 5"  (1.651 m)   Wt 117 lb 8 oz (53.3 kg)   SpO2 98%   BMI 19.55 kg/m   General: NAD. HEENT: No conjunctival pallor or scleral icterus. Moist mucous membranes.  OP clear. Neck: Supple without lymphadenopathy, thyromegaly, JVD, or HJR. No carotid bruit. Lungs: Normal work of breathing.  Clinton [facility] diminished breath sounds throughout without wheezes or crackles.  Heart: Regular rate and rhythm without murmurs, rubs, or gallops. Non-displaced PMI. Abd: Bowel sounds present. Soft, NT/ND without hepatosplenomegaly Ext: No lower extremity edema. Radial, PT, and DP pulses are 2+ bilaterally. Skin: Warm and dry without rash. Neuro: Cranial nerves III-XII intact.  No focal deficits.  EKG: Normal sinus rhythm with left anterior fascicular block and nonspecific T wave changes.  T wave flattening more pronounced in the lateral precordial leads today.  Otherwise, no significant change since 07/18/2016.  Lab Results  Component Value Date   WBC 3.4 (L) 07/04/2016   HGB 12.7 (L) 07/04/2016   HCT 37.1 (L) 07/04/2016   MCV 88.5 07/04/2016   PLT 243 07/04/2016    Lab Results  Component Value  Date   NA 139 07/04/2016   K 3.8 07/04/2016   CL 108 07/04/2016   CO2 25 07/04/2016   BUN 14 07/04/2016   CREATININE 1.08 07/04/2016   GLUCOSE 92 07/04/2016   ALT 16 07/18/2016    Lab Results  Component Value Date   CHOL 123 07/18/2016   HDL 33 (L) 07/18/2016   LDLCALC 73 07/18/2016   TRIG 84 07/18/2016   CHOLHDL 3.7 07/18/2016    --------------------------------------------------------------------------------------------------  ASSESSMENT AND PLAN: Coronary artery disease Atypical chest pain is atypical and stable.  No recurrent angina.  I have encouraged Mr. Maisel to continue taking aspirin.  We will restart a statin at this time.  He does not wish to take any additional medications.  We will therefore defer restarting carvedilol.  Importance of smoking  cessation was stressed.  TIA Transient right-sided paresthesia and weakness a few weeks ago is concerning for TIA.  He does not have any focal deficits today.  I have encouraged him to continue aspirin and to begin taking atorvastatin.  We will obtain carotid Dopplers.  I discussed referral to neurology, we have agreed to defer this for now.  I advised him to call 911 if he has any recurrent weakness or paresthesia.  Chronic systolic heart failure due to ischemic cardiomyopathy Mr. Corti appears euvolemic with NYHA class II symptoms.  His dyspnea is also likely related to severe COPD.  We have agreed to repeat an echocardiogram.  Mr. Pezzullo has not been taking any of his cardiac medications.  For now, we will defer restarting carvedilol or adding an ACE inhibitor/ARB, given his desire to avoid additional medications.  Hyperlipidemia Goal LDL less than 70.  I will check a fasting lipid panel and CMP today and start atorvastatin 20 mg daily.  Follow-up: Return to clinic in 6 to 8 weeks.  [name], MD 10/23/2017 9:55 AM

## 2017-10-24 LAB — CBC WITH DIFFERENTIAL/PLATELET
BASOS ABS: 0 10*3/uL (ref 0.0–0.2)
Basos: 1 %
EOS (ABSOLUTE): 0.2 10*3/uL (ref 0.0–0.4)
Eos: 4 %
HEMOGLOBIN: 15.1 g/dL (ref 13.0–17.7)
Hematocrit: 45 % (ref 37.5–51.0)
Immature Grans (Abs): 0 10*3/uL (ref 0.0–0.1)
Immature Granulocytes: 0 %
LYMPHS ABS: 1 10*3/uL (ref 0.7–3.1)
Lymphs: 23 %
MCH: 30.6 pg (ref 26.6–33.0)
MCHC: 33.6 g/dL (ref 31.5–35.7)
MCV: 91 fL (ref 79–97)
Monocytes Absolute: 0.5 10*3/uL (ref 0.1–0.9)
Monocytes: 12 %
NEUTROS ABS: 2.5 10*3/uL (ref 1.4–7.0)
Neutrophils: 60 %
PLATELETS: 297 10*3/uL (ref 150–450)
RBC: 4.94 x10E6/uL (ref 4.14–5.80)
RDW: 14.1 % (ref 12.3–15.4)
WBC: 4.2 10*3/uL (ref 3.4–10.8)

## 2017-10-24 LAB — COMPREHENSIVE METABOLIC PANEL
A/G RATIO: 1.1 — AB (ref 1.2–2.2)
ALK PHOS: 65 IU/L (ref 39–117)
ALT: 9 IU/L (ref 0–44)
AST: 15 IU/L (ref 0–40)
Albumin: 3.8 g/dL (ref 3.5–5.5)
BILIRUBIN TOTAL: 0.2 mg/dL (ref 0.0–1.2)
BUN / CREAT RATIO: 16 (ref 9–20)
BUN: 15 mg/dL (ref 6–24)
CHLORIDE: 106 mmol/L (ref 96–106)
CO2: 24 mmol/L (ref 20–29)
Calcium: 9 mg/dL (ref 8.7–10.2)
Creatinine, Ser: 0.93 mg/dL (ref 0.76–1.27)
GFR calc Af Amer: 106 mL/min/{1.73_m2} (ref >59)
GFR calc non Af Amer: 92 mL/min/{1.73_m2} (ref >59)
GLUCOSE: 77 mg/dL (ref 65–99)
Globulin, Total: 3.4 g/dL (ref 1.5–4.5)
POTASSIUM: 4.6 mmol/L (ref 3.5–5.2)
Sodium: 141 mmol/L (ref 134–144)
Total Protein: 7.2 g/dL (ref 6.0–8.5)

## 2017-10-24 LAB — LIPID PANEL
CHOLESTEROL TOTAL: 201 mg/dL — AB (ref 100–199)
Chol/HDL Ratio: 6.1 ratio — ABNORMAL HIGH (ref 0.0–5.0)
HDL: 33 mg/dL — ABNORMAL LOW (ref >39)
LDL Calculated: 134 mg/dL — ABNORMAL HIGH (ref 0–99)
Triglycerides: 169 mg/dL — ABNORMAL HIGH (ref 0–149)
VLDL CHOLESTEROL CAL: 34 mg/dL (ref 5–40)

## 2017-10-29 ENCOUNTER — Ambulatory Visit (INDEPENDENT_AMBULATORY_CARE_PROVIDER_SITE_OTHER): Payer: Managed Care, Other (non HMO) | Admitting: Internal Medicine

## 2017-10-29 ENCOUNTER — Encounter: Payer: Self-pay | Admitting: Internal Medicine

## 2017-10-29 VITALS — BP 128/82 | HR 62 | Ht 64.0 in | Wt 117.0 lb

## 2017-10-29 DIAGNOSIS — F17210 Nicotine dependence, cigarettes, uncomplicated: Secondary | ICD-10-CM

## 2017-10-29 DIAGNOSIS — J449 Chronic obstructive pulmonary disease, unspecified: Secondary | ICD-10-CM | POA: Diagnosis not present

## 2017-10-29 DIAGNOSIS — R911 Solitary pulmonary nodule: Secondary | ICD-10-CM | POA: Diagnosis not present

## 2017-10-29 NOTE — Progress Notes (Signed)
Clinton [facility]      Date: 10/29/2017,   MRN# [id number] Clinton [facility] 04-15-1960     AdmissionWeight: 117 lb (53.1 kg)                 CurrentWeight: 117 lb (53.1 kg) Clinton [facility] is a 57 y.o. old male seen in [facility] for abnormal CT scan at the request of Dr. Janese Banks.  SYNPOSIS   57 yr old male with prior h/o CAD in 2013. Patient had previous  cardiac catheter and another stent placed in July Patient currently on ASA and Brilanta threapy  Patient had CT chest to assess for PE/dissection and found to have incidental LUL nodule, patient then had follow up PET scan which showed increased SUV uptake  Repeat CT chest on 06/28/16 shows a stable persistent left upper lobe nodule No significant change in size  Repeat CT chest 04/03/2017 Shows stable nodules No increase in size Severe emphysema noted  Office spiro shows moderate to severe obstruction ratio48% fev1 2L 63% fef25/75 0.8L 29% ONO+hypoxia  CHIEF COMPLAINT:  Severe COPD Chronic SOB and DOE      HPI Patient follow-up with severe COPD Chronic shortness of breath progressive shortness of breath and dyspnea on exertion Patient continues to smoke Patient has chronic wheezing  Patient refuses to stop smoking and vaping at this time  Last CT of the chest in April 2019 showed stable left upper lobe nodular scarring no change in size Neck CT chest is in 6 months  No signs of infection at this time No signs of acute heart failure No signs of acute respiratory distress  Patient uses nebulizer treatment daily He does not see any benefit from usage   Patient has very poor respiratory compliance and very poor respiratory status   Patient states he does not uses oxygen at night   No signs of infecti    Current Medication:  Current Outpatient Medications:  .  aspirin EC 81 MG tablet, Take 1 tablet (81 mg total) by mouth daily., Disp: 30 tablet, Rfl: 0 .   atorvastatin (LIPITOR) 20 MG tablet, Take 1 tablet (20 mg total) by mouth daily., Disp: 90 tablet, Rfl: 3 .  budesonide (PULMICORT) 0.5 MG/2ML nebulizer solution, TAKE 2 MLS (0.5 MG TOTAL) BY NEBULIZATION 2 (TWO) TIMES DAILY. DX: COPD J44.9, Disp: 1080 mL, Rfl: 2 .  formoterol (PERFOROMIST) 20 MCG/2ML nebulizer solution, Take 2 mLs (20 mcg total) by nebulization 2 (two) times daily. DX: COPD J44.9, Disp: 120 mL, Rfl: 6 .  Tiotropium Bromide Monohydrate (SPIRIVA RESPIMAT) 1.25 MCG/ACT AERS, Inhale 1.25 Act into the lungs 2 (two) times daily., Disp: 1 Inhaler, Rfl: 6 .  carvedilol (COREG) 3.125 MG tablet, Take 1 tablet (3.125 mg total) by mouth 2 (two) times daily with a meal. (Patient not taking: Reported on 10/23/2017), Disp: 180 tablet, Rfl: 0 .  Fluticasone-Salmeterol (ADVAIR DISKUS) 250-50 MCG/DOSE AEPB, Inhale 1 puff into the lungs 2 (two) times daily., Disp: 1 each, Rfl: 3 .  lisinopril (PRINIVIL,ZESTRIL) 5 MG tablet, Take 1 tablet (5 mg total) by mouth daily. (Patient not taking: Reported on 10/23/2017), Disp: 90 tablet, Rfl: 0 .  prasugrel (EFFIENT) 10 MG TABS tablet, Take 10 mg by mouth daily., Disp: , Rfl:  .  ranolazine (RANEXA) 500 MG 12 hr tablet, Take 1 tablet (500 mg total) by mouth 2 (two) times daily. (Patient not taking: Reported on 10/23/2017), Disp: 180 tablet, Rfl: 3    ALLERGIES   Sulfa  antibiotics     REVIEW OF SYSTEMS   Review of Systems  Constitutional: Positive for malaise/fatigue. Negative for chills, diaphoresis, fever and weight loss.  HENT: Negative for congestion and hearing loss.   Eyes: Negative for blurred vision and double vision.  Respiratory: Positive for cough, shortness of breath and wheezing. Negative for hemoptysis and sputum production.   Cardiovascular: Positive for orthopnea. Negative for chest pain and palpitations.  Gastrointestinal: Negative for heartburn.  Skin: Negative for rash.  Neurological: Negative for weakness.  All other systems  reviewed and are negative.  BP 128/82 (BP Location: Left Arm, Cuff Size: Normal)   Pulse 62   Ht 5\' 4"  (1.626 m)   Wt 117 lb (53.1 kg)   SpO2 98%   BMI 20.08 kg/m     PHYSICAL EXAM  Physical Exam  Constitutional: He is oriented to person, place, and time. He appears well-developed and well-nourished. No distress.  Cardiovascular: Normal rate, regular rhythm and normal heart sounds.  No murmur heard. Pulmonary/Chest: Effort normal and breath sounds normal. No stridor. No respiratory distress. He has no wheezes.  Musculoskeletal: Normal range of motion. He exhibits no edema.  Neurological: He is alert and oriented to person, place, and time. No cranial nerve deficit.  Skin: Skin is warm. He is not diaphoretic.  Psychiatric: He has a normal mood and affect.          IMAGING    I have Independently reviewed images of  PET scan on 04/26/16    Interpretation:LUL nodular scarring with SUV 2.7 mediastinal adenopathy  I have independently reviewed the images of CT scan on 06/28/16 on 07/30/16 which showed persistent left upper lobe nodular scarring that has not changed in size   Follow up CT chest 10/16/16 -Stable 2.6 cm left upper lobe pulmonary nodule compared to prior studies dating back to 6 months ago.  -stable mildly enlarged mediastinal lymph nodes.   ONO +hypoxia I believe the first he is 6 ~5 days who is in the middle of before returning about 10 to 14 days as listed worsening right Follow-up CT chest 04/20/2017 Stable nodule stable adenopathy   ASSESSMENT/PLAN    57 year old thin white male with history of acute MI with incidental finding of left upper lobe nodule and adenopathy with severe COPD Gold stage D.  His COPD seems to be progressively worsening over the last several months in the presence of active smoking  #1 chronic shortness of breath and dyspnea on exertion Definitely related to his severe COPD and cardiomyopathy as well as ongoing tobacco  abuse  #2 severe COPD Gold stage D Continue nebulized therapy as prescribed Continue Spiriva Respimat as prescribed Smoking cessation strongly advised  #3 left upper lobe lung nodule with adenopathy Most likely scarring however will need repeat CT chest in the next 6 months Previous scans have showed stable appearance without any significant growth based on the last CT scan in April 2019  Patient is a high risk for malignancy Patient will need repeat CT chest in 6 months and if it grows or there is any significant changes he will need an ENB and Evis for further diagnostic work-up however he has had a very high risk for postop complications from his underlying CAD and COPD  #4 ischemic cardiomyopathy with an EF of 40% Follow-up cardiology recommendations   #5 smoking cessation strongly advised and also need to stop vaping Discussion for approximately 5 minutes     Smoking Assessment and Cessation Counseling  Upon further questioning, Patient smokes 1 ppd  I have advised patient to quit/stop smoking as soon as possible due to high risk for multiple medical problems  Patient  is NOT willing to quit smoking  I have advised patient that we can assist and have options of Nicotine replacement therapy. I also advised patient on behavioral therapy and can provide oral medication therapy in conjunction with the other therapies  Follow up next Office visit  for assessment of smoking cessation  Smoking cessation counseling advised for 5 minutes       Patient satisfied with Plan of action and management. All questions answered His overall prognosis is very poor with chronic debilitating respiratory disease in the setting of ongoing tobacco abuse with the fact that his nebulizer treatments are no longer helping him      Corrin Parker, M.D.  [facility]  Medical Director [facility] Director [facility] Cardio-Pulmonary Department

## 2017-10-29 NOTE — Patient Instructions (Signed)
Follow up CT chest in 6 months   Continue breathing treatments as prescribed  Follow up with Cardiology as scheduled

## 2017-11-06 ENCOUNTER — Ambulatory Visit (INDEPENDENT_AMBULATORY_CARE_PROVIDER_SITE_OTHER): Payer: Managed Care, Other (non HMO)

## 2017-11-06 ENCOUNTER — Other Ambulatory Visit: Payer: Self-pay

## 2017-11-06 DIAGNOSIS — I5022 Chronic systolic (congestive) heart failure: Secondary | ICD-10-CM | POA: Diagnosis not present

## 2017-11-06 DIAGNOSIS — G459 Transient cerebral ischemic attack, unspecified: Secondary | ICD-10-CM | POA: Diagnosis not present

## 2017-11-26 ENCOUNTER — Encounter: Payer: Self-pay | Admitting: Nurse Practitioner

## 2017-11-26 ENCOUNTER — Ambulatory Visit (INDEPENDENT_AMBULATORY_CARE_PROVIDER_SITE_OTHER): Payer: Managed Care, Other (non HMO) | Admitting: Nurse Practitioner

## 2017-11-26 VITALS — BP 130/90 | HR 58 | Ht 65.0 in | Wt 115.5 lb

## 2017-11-26 DIAGNOSIS — I25119 Atherosclerotic heart disease of native coronary artery with unspecified angina pectoris: Secondary | ICD-10-CM

## 2017-11-26 DIAGNOSIS — R0789 Other chest pain: Secondary | ICD-10-CM | POA: Diagnosis not present

## 2017-11-26 DIAGNOSIS — E785 Hyperlipidemia, unspecified: Secondary | ICD-10-CM

## 2017-11-26 DIAGNOSIS — I10 Essential (primary) hypertension: Secondary | ICD-10-CM

## 2017-11-26 DIAGNOSIS — I5022 Chronic systolic (congestive) heart failure: Secondary | ICD-10-CM | POA: Diagnosis not present
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
## 2017-11-26 NOTE — Patient Instructions (Signed)
Medication Instructions:  - Your physician recommends that you continue on your current medications as directed. Please refer to the Current Medication list given to you today.  If you need a refill on your cardiac medications before your next appointment, please call your pharmacy.   Lab work: - Your physician recommends that you return for FASTING lab work in: 1 month- Lipid/ Liver  If you have labs (blood work) drawn today and your tests are completely normal, you will receive your results only by: Marland Kitchen MyChart Message (if you have MyChart) OR . A paper copy in the mail If you have any lab test that is abnormal or we need to change your treatment, we will call you to review the results.  Testing/Procedures: - none ordered  Follow-Up: At [facility], you and your health needs are our priority.  As part of our continuing mission to provide you with exceptional heart care, we have created designated Provider Care Teams.  These Care Teams include your primary Cardiologist (physician) and Advanced Practice Providers (APPs -  Physician Assistants and Nurse Practitioners) who all work together to provide you with the care you need, when you need it. Marland Kitchen 3-4 months with Dr. Saunders Revel  Any Other Special Instructions Will Be Listed Below (If Applicable). - N/A

## 2017-11-28 IMAGING — CR DG LUMBAR SPINE 2-3V
3 series · 3 of 3 positions shown · non-contrast
Comparison: PET-CT 04/26/2016.

CLINICAL DATA: Knee back pain.

EXAM:
LUMBAR SPINE - 2-3 VIEW

[l-spine ap]
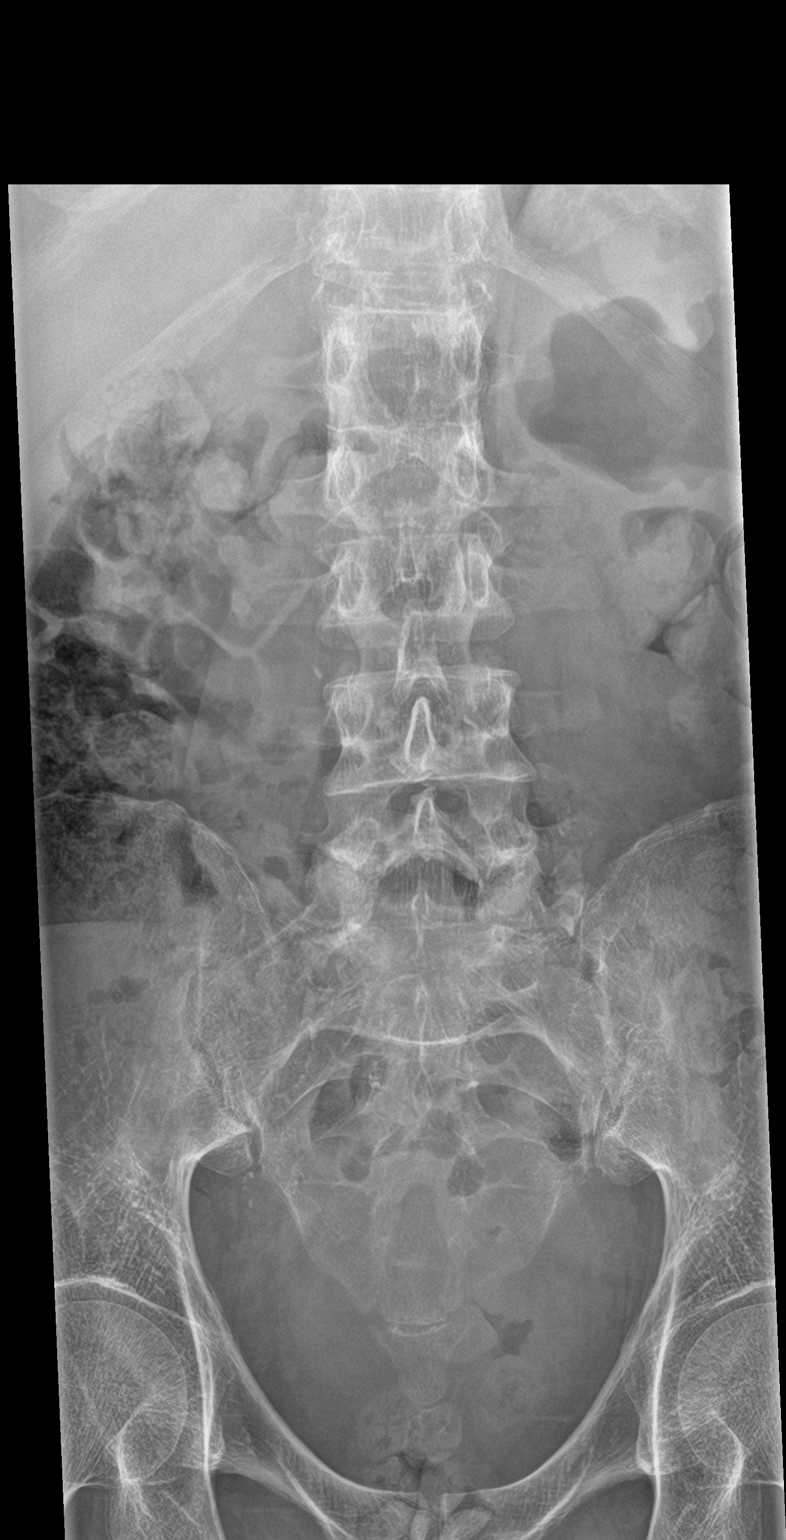

[l-spine lat]
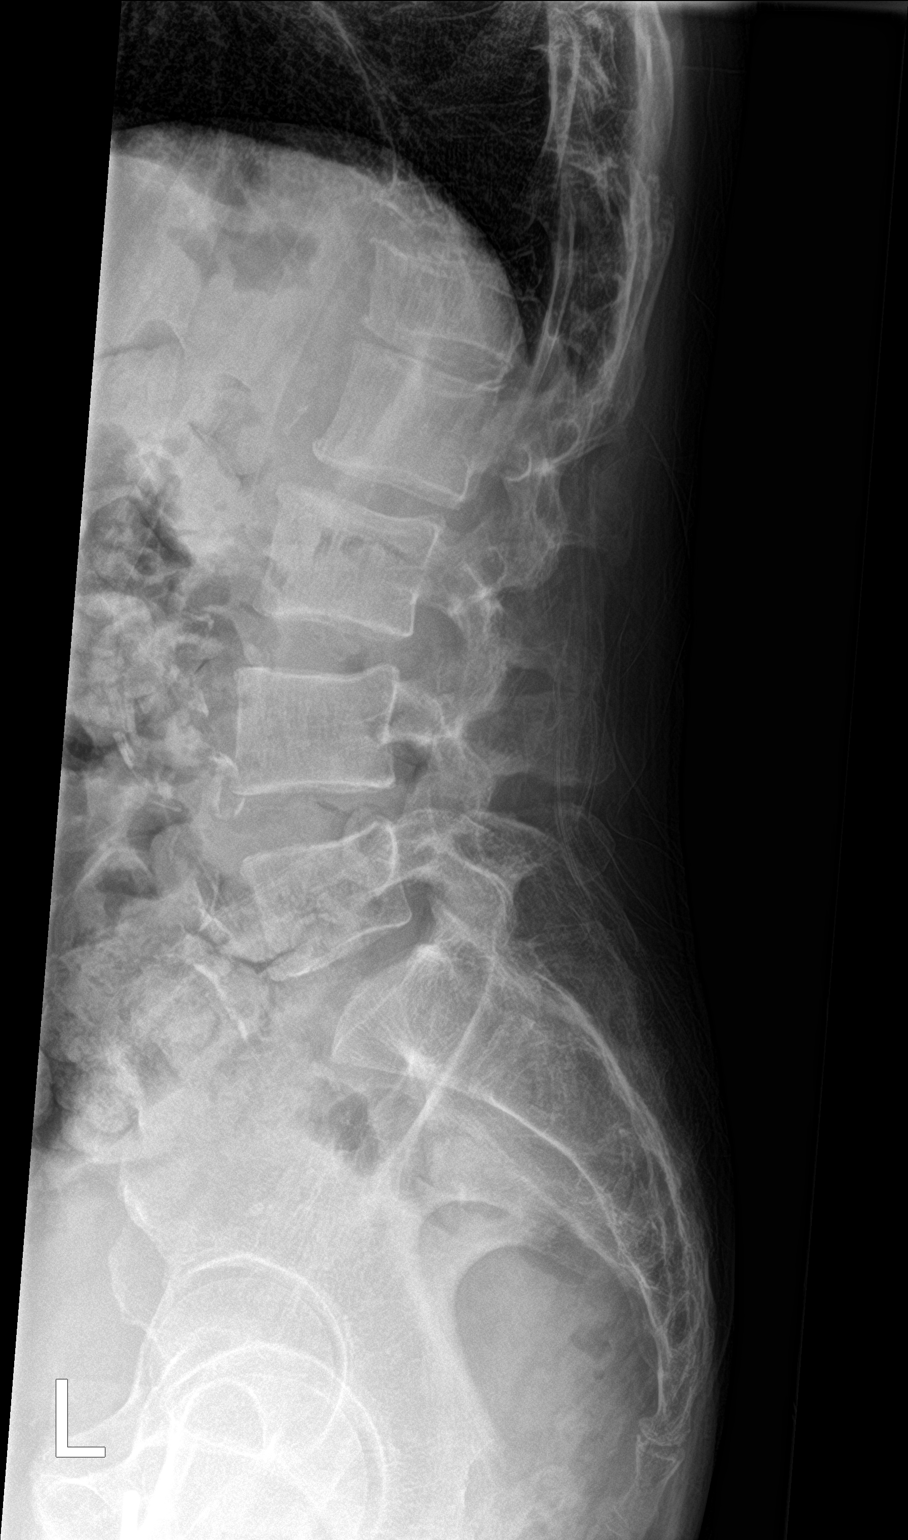

[l-spine spot]
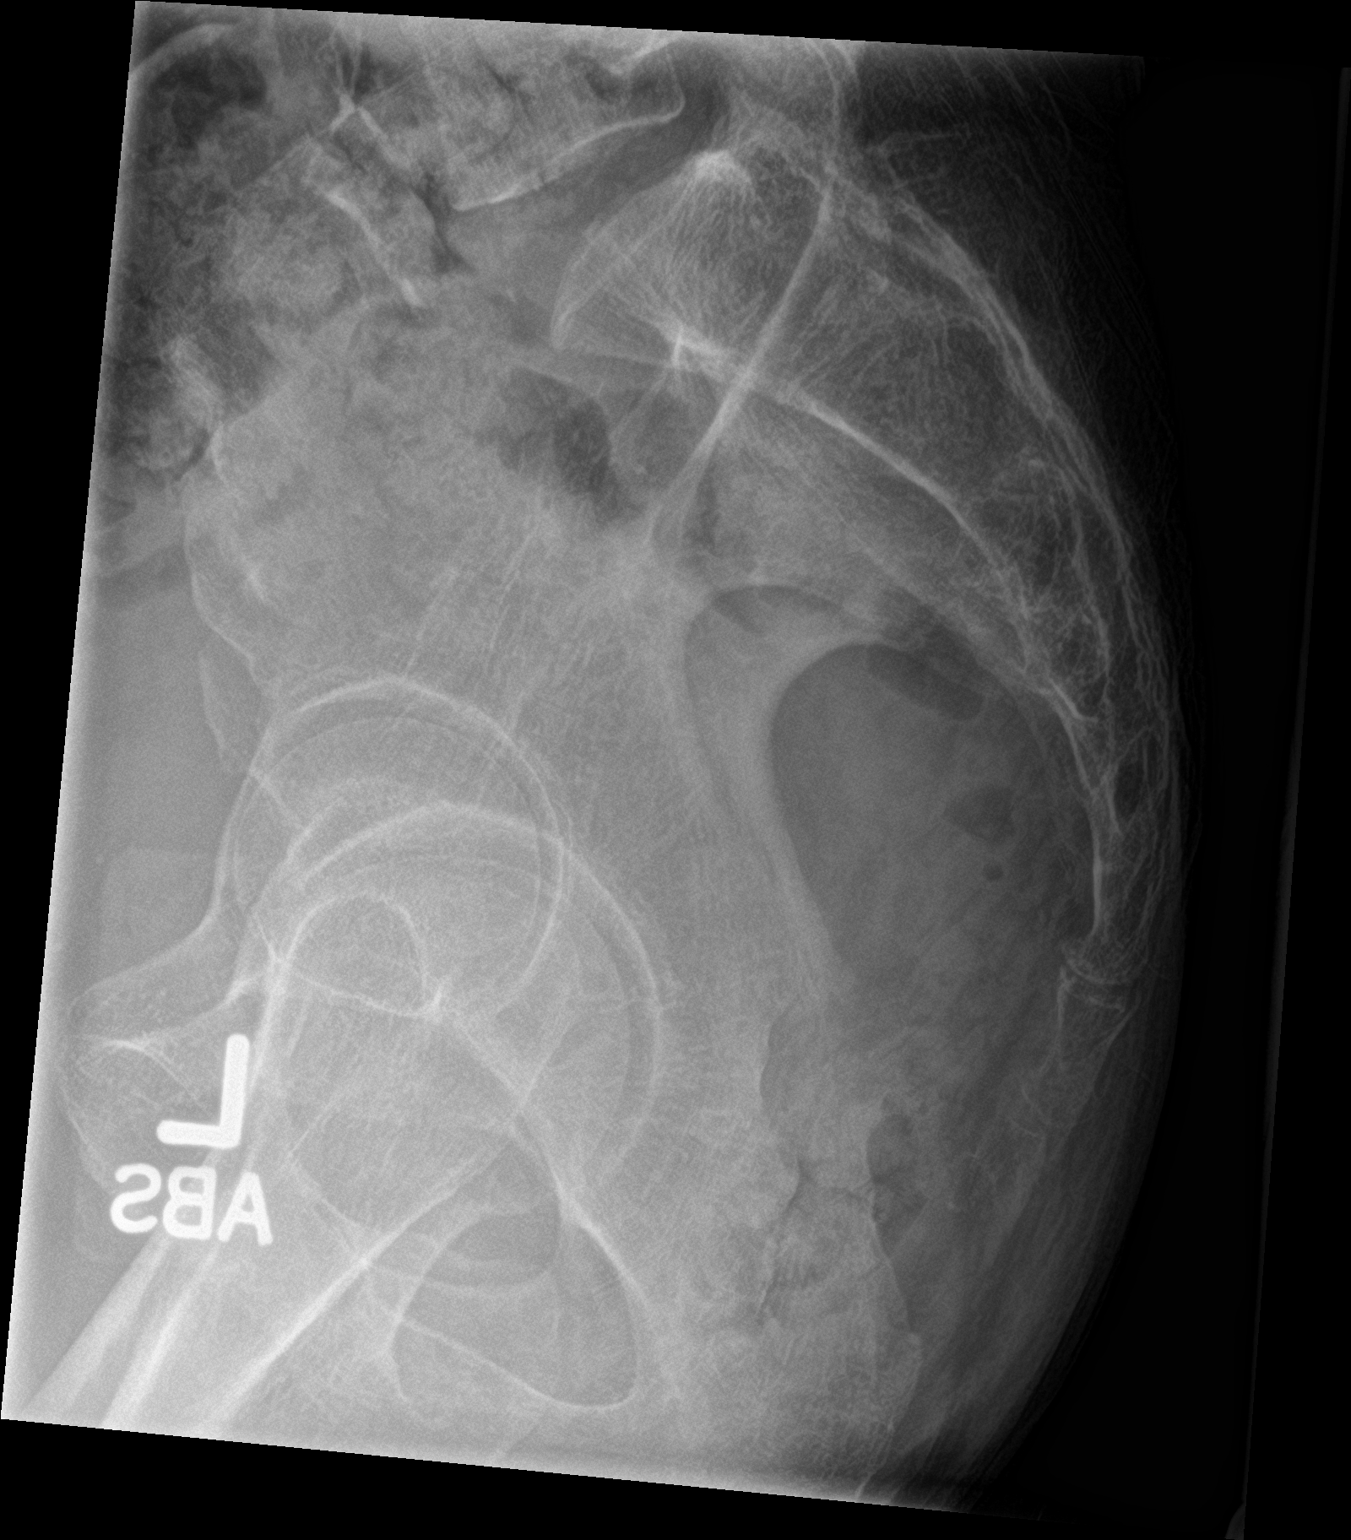

[3 of 3 positions shown; findings below may reference images not displayed]

FINDINGS: Degenerative changes and osteopenia lumbar spine. Mild L1
compression fracture, age undetermined. Aortoiliac atherosclerotic
vascular disease .
IMPRESSION: 1. Diffuse osteopenia degenerative change.

2. Mild L1 compression fracture, age undetermined.

3. Aortoiliac atherosclerotic vascular disease.

## 2018-03-25 ENCOUNTER — Telehealth: Payer: Self-pay | Admitting: *Deleted

## 2018-03-25 NOTE — Telephone Encounter (Signed)
   Cardiac Questionnaire:    Since your last visit or hospitalization:    1. Have you been having new or worsening chest pain? NO   2. Have you been having new or worsening shortness of breath? NO, SAME AS USUAL. 3. Have you been having new or worsening leg swelling, wt gain, or increase in abdominal girth (pants fitting more tightly)? NO   4. Have you had any passing out spells? NO    *A YES to any of these questions would result in the appointment being kept. *If all the answers to these questions are NO, we should indicate that given the current situation regarding the worldwide coronarvirus pandemic, at the recommendation of the CDC, we are looking to limit gatherings in our waiting area, and thus will reschedule their appointment beyond four weeks from today.   _____________      Primary Cardiologist:  [name], MD   Patient contacted.  History reviewed.  No symptoms to suggest any unstable cardiac conditions.  Based on discussion, with current pandemic situation, we will be postponing this appointment for Clinton Walsh with a plan for f/u in 3 MONTHS wks or sooner if feasible/necessary.  If symptoms change, he has been instructed to contact our office.   Routing to C19 CANCEL pool for tracking (P CV DIV CV19 CANCEL - reason for visit "other.") and assigning priority (1 = 4-6 wks, 2 = 6-12 wks, 3 = >12 wks).   Roney Jaffe, RN  03/25/2018 12:08 PM         .                .

## 2018-03-25 NOTE — Telephone Encounter (Signed)
Attempted to reach patient to see if symptoms stable and ok to cancel appointment for 03/28/18. No answer. Left message to call back.

## 2018-03-28 ENCOUNTER — Ambulatory Visit: Payer: Managed Care, Other (non HMO) | Admitting: Internal Medicine

## 2018-03-31 NOTE — Telephone Encounter (Signed)
LMOV to schedule evisit with Dr Saunders Revel

## 2018-04-10 NOTE — Telephone Encounter (Signed)
°  LMOV to schedule evisit

## 2018-04-14 NOTE — Telephone Encounter (Signed)
Recall for 3 month follow up entered into Epic appointments since have not heard anything from patient after leaving messages to schedule e visit. Closing encounter and removing from pool.

## 2018-04-30 ENCOUNTER — Ambulatory Visit: Payer: Managed Care, Other (non HMO) | Admitting: Internal Medicine

## 2018-05-19 ENCOUNTER — Other Ambulatory Visit: Payer: Self-pay

## 2018-05-19 DIAGNOSIS — R911 Solitary pulmonary nodule: Secondary | ICD-10-CM

## 2018-05-22 ENCOUNTER — Other Ambulatory Visit: Payer: Self-pay

## 2018-05-22 ENCOUNTER — Ambulatory Visit
Admission: RE | Admit: 2018-05-22 | Discharge: 2018-05-22 | Disposition: A | Discharge status: Home / Self Care | Payer: Managed Care, Other (non HMO) | Source: Ambulatory Visit | Attending: Internal Medicine | Admitting: Internal Medicine

## 2018-05-22 DIAGNOSIS — R911 Solitary pulmonary nodule: Secondary | ICD-10-CM

## 2018-05-29 ENCOUNTER — Ambulatory Visit (INDEPENDENT_AMBULATORY_CARE_PROVIDER_SITE_OTHER): Payer: Managed Care, Other (non HMO) | Admitting: Internal Medicine

## 2018-05-29 ENCOUNTER — Other Ambulatory Visit: Payer: Self-pay

## 2018-05-29 ENCOUNTER — Encounter: Payer: Self-pay | Admitting: Internal Medicine

## 2018-05-29 DIAGNOSIS — R918 Other nonspecific abnormal finding of lung field: Secondary | ICD-10-CM

## 2018-05-29 DIAGNOSIS — F17210 Nicotine dependence, cigarettes, uncomplicated: Secondary | ICD-10-CM

## 2018-05-29 DIAGNOSIS — J449 Chronic obstructive pulmonary disease, unspecified: Secondary | ICD-10-CM | POA: Diagnosis not present

## 2018-05-29 DIAGNOSIS — I729 Aneurysm of unspecified site: Secondary | ICD-10-CM

## 2018-05-29 DIAGNOSIS — Z720 Tobacco use: Secondary | ICD-10-CM

## 2018-05-29 MED ORDER — UMECLIDINIUM-VILANTEROL 62.5-25 MCG/INH IN AEPB
1.0000 | INHALATION_SPRAY | Freq: Every morning | RESPIRATORY_TRACT | 10 refills | Status: DC
Start: 2018-05-29 — End: 2018-07-31

## 2018-05-29 NOTE — Addendum Note (Signed)
Addended by: Maryanna Shape A on: 05/29/2018 11:05 AM   Modules accepted: Orders

## 2018-05-29 NOTE — Patient Instructions (Addendum)
Recommend vascular surgery consultation for aneurysm  STOP SMOKING!!  CONTINUE NEB THERAPY AS PRESCRIBED  ANORO INHALER PRESCIBED  Follow up CT chest in 6 months

## 2018-05-29 NOTE — Progress Notes (Signed)
Clinton Walsh      Date: 05/29/2018,   MRN# [id number] Clinton Walsh February 18, 1960     Admission                  Current  Clinton Walsh is a 59 y.o. old male seen in [place] for abnormal CT scan at the request of Dr. Janese Banks.  SYNPOSIS   58 yr old male with prior h/o CAD in 2013. Patient had previous  cardiac catheter and another stent placed in July Patient currently on ASA and Brilanta threapy  Patient had CT chest to assess for PE/dissection and found to have incidental LUL nodule, patient then had follow up PET scan which showed increased SUV uptake  Repeat CT chest on 06/28/16 shows a stable persistent left upper lobe nodule No significant change in size  Repeat CT chest 04/03/2017 Shows stable nodules No increase in size Severe emphysema noted  Office spiro shows moderate to severe obstruction ratio48% fev1 2L 63% fef25/75 0.8L 29% ONO+hypoxia  CT chest 05/2018 1. No acute abnormality detected. 2. Multiple pulmonary nodules are again identified bilaterally, all of which are stable from prior study. No new worrisome pulmonary nodules identified on today's exam. 3. Ectatic ascending thoracic aorta measuring 4.3 cm (previously measuring 4 cm). Recommend annual imaging followup by CTA or MRA. The CT chest  was Independently Reviewed By Me Today      TELEPHONE VISIT    In the setting of the current Covid19 crisis, you are scheduled for a  visit with me on 05/29/2018   Just as we do with many in-office visits, in order for you to participate in this visit, we must obtain consent.   I can obtain your verbal consent now.  PATIENT AGREES AND CONFIRMS -YES This Visit has Audio and Visual Capabilities for optimal patient care experience   Evaluation Performed:  Follow-up visit  This visit type was conducted due to national recommendations for restrictions regarding the COVID-19 Pandemic (e.g. social distancing).  This format  is felt to be most appropriate for this patient at this time.  All issues noted in this document were discussed and addressed.     Virtual Visit via Telephone Note   I connected with patient on 05/29/2018 by telephone and verified that I am speaking with the correct person using two identifiers.   I discussed the limitations, risks, security and privacy concerns of performing an evaluation and management service by telephone and the availability of in person appointments. I also discussed with the patient that there may be a patient responsible charge related to this service. The patient expressed understanding and agreed to proceed.   Location of the patient: Home Location of provider:[place] Participating persons: Patient and provider only    CHIEF COMPLAINT:  Follow up severe COPD +chronic SOB and DOE   HPI Chronic shortness of breath and dyspnea on exertion Remained stable Continues to smoke Patient has chronic wheezing  He refuses to stop smoking and vaping at this time  Last CT chest April 2019 showed stable left upper lobe nodular scarring No evidence of infection at this time No evidence of acute heart failure No evidence of COPD exacerbation No evidence of acute respiratory distress   He uses nebulizer treatment daily Patient has very poor respiratory compliance and very poor respiratory status He does use oxygen at nighttime He uses this and benefits from oxygen therapy   Smoking Assessment and Cessation Counseling   Upon further questioning,  Patient smokes 1 ppd  I have advised patient to quit/stop smoking as soon as possible due to high risk for multiple medical problems  Patient is NOT willing to quit smoking  I have advised patient that we can assist and have options of Nicotine replacement therapy. I also advised patient on behavioral therapy and can provide oral medication therapy in conjunction with the other therapies  Follow up next Office visit   for assessment of smoking cessation  Smoking cessation counseling advised for 4 minutes    No signs of infecti    Current Medication:  Current Outpatient Medications:  .  aspirin EC 81 MG tablet, Take 1 tablet (81 mg total) by mouth daily., Disp: 30 tablet, Rfl: 0 .  atorvastatin (LIPITOR) 20 MG tablet, Take 1 tablet (20 mg total) by mouth daily., Disp: 90 tablet, Rfl: 3    ALLERGIES   Sulfa antibiotics     REVIEW OF SYSTEMS   Review of Systems  Constitutional: Positive for malaise/fatigue. Negative for chills, diaphoresis, fever and weight loss.  HENT: Negative for congestion and hearing loss.   Eyes: Negative for blurred vision and double vision.  Respiratory: Positive for cough, shortness of breath and wheezing. Negative for hemoptysis and sputum production.   Cardiovascular: Positive for orthopnea. Negative for chest pain and palpitations.  Gastrointestinal: Negative for heartburn.  Skin: Negative for rash.  Neurological: Negative for weakness.  All other systems reviewed and are negative.            IMAGING    I have Independently reviewed images of  PET scan on 04/26/16    Interpretation:LUL nodular scarring with SUV 2.7 mediastinal adenopathy  I have independently reviewed the images of CT scan on 06/28/16 on 07/30/16 which showed persistent left upper lobe nodular scarring that has not changed in size   Follow up CT chest 10/16/16 -Stable 2.6 cm left upper lobe pulmonary nodule compared to prior studies dating back to 6 months ago.  -stable mildly enlarged mediastinal lymph nodes.   ONO +hypoxia I believe the first he is 6 ~5 days who is in the middle of before returning about 10 to 14 days as listed worsening right Follow-up CT chest 04/20/2017 Stable nodule stable adenopathy    CT chest 05/2018 -No acute abnormality detected. -Multiple pulmonary nodules are again identified bilaterally, all of which are stable from prior study. No new  worrisome pulmonary nodules identified on today's exam. The CT chest  was Independently Reviewed By Me Today   ASSESSMENT/PLAN   58 year old thin white male with a history of acute MI with incidental finding of left upper lobe lung nodule and adenopathy with severe COPD Gold stage D in the setting of progressive worsening of his COPD in the presence of active smoking and tobacco abuse  Chronic shortness of breath and dyspnea exertion Definitely related to severe COPD and cardiomyopathy as well as ongoing tobacco abuse with severely deconditioned state  Severe COPD Gold stage D Continue nebulized therapy as prescribed Continue Spiriva Respimat as prescribed Patient has tried Anoro therapy and would like that instead Smoking cessation strongly advised  Left upper lobe lung nodule with adenopathy Most likely scar tissue however will need repeated CT chest in 6 months his current CAT scan does not show any significant changes and shows stable appearance since 2000 19 April There is a aneurysm noted in the descending aorta Patient will need vascular surgery [facility] for further assessment and evaluation  Patient is at high risk for  malignancy He will need ongoing CT scans of his chest every 6 months If there is any changes to the appearance of the CAT scan he will need bronchoscopy with electromagnetic navigation bronchoscopy and endobronchial ultrasound however he is at a very very high risk for postop complications from his underlying CAD COPD and he is a very high risk for cardiac arrest and death from the procedure  Ischemic cardiomyopathy EF of 40% Follow-up cardiology recommendations   Smoking cessation strongly advised    Patient satisfied with Plan of action and management. All questions answered His overall prognosis is very poor with chronic debilitating respiratory disease in the setting of ongoing tobacco abuse with the fact that his nebulizer treatments are no longer  helping him   COVID-19 EDUCATION: The signs and symptoms of COVID-19 were discussed with the patient and how to seek care for testing.  The importance of social distancing was discussed today. Hand Washing Techniques and avoid touching face was advised.  MEDICATION ADJUSTMENTS/LABS AND TESTS ORDERED:  CURRENT MEDICATIONS REVIEWED AT LENGTH WITH PATIENT TODAY   Patient satisfied with Plan of action and management. All questions answered Follow up in 6 months   Total time spent 25 minutes   Maretta Bees Patricia Pesa, M.D.  [facility]  Medical Director [place] Director [facility] Cardio-Pulmonary Department

## 2018-06-19 ENCOUNTER — Ambulatory Visit (INDEPENDENT_AMBULATORY_CARE_PROVIDER_SITE_OTHER): Payer: Managed Care, Other (non HMO) | Admitting: Vascular Surgery

## 2018-06-19 ENCOUNTER — Other Ambulatory Visit: Payer: Self-pay

## 2018-06-19 ENCOUNTER — Encounter (INDEPENDENT_AMBULATORY_CARE_PROVIDER_SITE_OTHER): Payer: Self-pay | Admitting: Vascular Surgery

## 2018-06-19 VITALS — BP 152/78 | HR 77 | Resp 16 | Ht 65.0 in | Wt 115.4 lb

## 2018-06-19 DIAGNOSIS — I714 Abdominal aortic aneurysm, without rupture, unspecified: Secondary | ICD-10-CM | POA: Insufficient documentation

## 2018-06-19 DIAGNOSIS — I2510 Atherosclerotic heart disease of native coronary artery without angina pectoris: Secondary | ICD-10-CM

## 2018-06-19 DIAGNOSIS — R1013 Epigastric pain: Secondary | ICD-10-CM | POA: Diagnosis not present

## 2018-06-19 DIAGNOSIS — I716 Thoracoabdominal aortic aneurysm, without rupture, unspecified: Secondary | ICD-10-CM | POA: Insufficient documentation

## 2018-06-19 DIAGNOSIS — K219 Gastro-esophageal reflux disease without esophagitis: Secondary | ICD-10-CM

## 2018-06-19 MED ORDER — TRAMADOL HCL 50 MG PO TABS: 50.0000 mg | ORAL_TABLET | Freq: Four times a day (QID) | ORAL | 0 refills | Status: DC | PRN

## 2018-06-19 MED ORDER — PANTOPRAZOLE SODIUM 40 MG PO TBEC: 40.0000 mg | DELAYED_RELEASE_TABLET | Freq: Every day | ORAL | 4 refills | Status: DC
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
## 2018-06-25 ENCOUNTER — Ambulatory Visit (INDEPENDENT_AMBULATORY_CARE_PROVIDER_SITE_OTHER): Payer: Managed Care, Other (non HMO) | Admitting: Internal Medicine

## 2018-06-25 ENCOUNTER — Other Ambulatory Visit: Payer: Self-pay

## 2018-06-25 ENCOUNTER — Encounter: Payer: Self-pay | Admitting: Internal Medicine

## 2018-06-25 VITALS — BP 140/92 | HR 102 | Ht 65.0 in | Wt 115.0 lb

## 2018-06-25 DIAGNOSIS — F17200 Nicotine dependence, unspecified, uncomplicated: Secondary | ICD-10-CM | POA: Diagnosis not present

## 2018-06-25 DIAGNOSIS — R1013 Epigastric pain: Secondary | ICD-10-CM

## 2018-06-25 DIAGNOSIS — R109 Unspecified abdominal pain: Secondary | ICD-10-CM | POA: Diagnosis not present

## 2018-06-25 DIAGNOSIS — I5022 Chronic systolic (congestive) heart failure: Secondary | ICD-10-CM

## 2018-06-25 DIAGNOSIS — I2510 Atherosclerotic heart disease of native coronary artery without angina pectoris: Secondary | ICD-10-CM

## 2018-06-25 LAB — POCT URINALYSIS DIPSTICK
Bilirubin, UA: NEGATIVE
Blood, UA: NEGATIVE
Glucose, UA: NEGATIVE
Ketones, UA: NEGATIVE
Leukocytes, UA: NEGATIVE
Nitrite, UA: NEGATIVE
Protein, UA: NEGATIVE
Spec Grav, UA: 1.01 (ref 1.010–1.025)
Urobilinogen, UA: 0.2 E.U./dL
pH, UA: 6.5 (ref 5.0–8.0)

## 2018-06-25 NOTE — Patient Instructions (Signed)
Take Pantoprazole twice a day  Will call tomorrow with test results
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
## 2018-06-26 ENCOUNTER — Other Ambulatory Visit: Payer: Self-pay | Admitting: Internal Medicine

## 2018-06-26 DIAGNOSIS — R1013 Epigastric pain: Secondary | ICD-10-CM

## 2018-06-26 LAB — COMPREHENSIVE METABOLIC PANEL
ALT: 13 IU/L (ref 0–44)
AST: 14 IU/L (ref 0–40)
Albumin/Globulin Ratio: 1.3 (ref 1.2–2.2)
Albumin: 4.2 g/dL (ref 3.8–4.9)
Alkaline Phosphatase: 81 IU/L (ref 39–117)
BUN/Creatinine Ratio: 18 (ref 9–20)
BUN: 17 mg/dL (ref 6–24)
Bilirubin Total: 0.2 mg/dL (ref 0.0–1.2)
CO2: 24 mmol/L (ref 20–29)
Calcium: 9.4 mg/dL (ref 8.7–10.2)
Chloride: 103 mmol/L (ref 96–106)
Creatinine, Ser: 0.96 mg/dL (ref 0.76–1.27)
GFR calc Af Amer: 102 mL/min/{1.73_m2} (ref >59)
GFR calc non Af Amer: 89 mL/min/{1.73_m2} (ref >59)
Globulin, Total: 3.3 g/dL (ref 1.5–4.5)
Glucose: 78 mg/dL (ref 65–99)
Potassium: 4.6 mmol/L (ref 3.5–5.2)
Sodium: 141 mmol/L (ref 134–144)
Total Protein: 7.5 g/dL (ref 6.0–8.5)

## 2018-06-26 LAB — CBC WITH DIFFERENTIAL/PLATELET
Basophils Absolute: 0 10*3/uL (ref 0.0–0.2)
Basos: 1 %
EOS (ABSOLUTE): 0.1 10*3/uL (ref 0.0–0.4)
Eos: 2 %
Hematocrit: 47.2 % (ref 37.5–51.0)
Hemoglobin: 16.1 g/dL (ref 13.0–17.7)
Immature Grans (Abs): 0 10*3/uL (ref 0.0–0.1)
Immature Granulocytes: 0 %
Lymphocytes Absolute: 1 10*3/uL (ref 0.7–3.1)
Lymphs: 21 %
MCH: 30.6 pg (ref 26.6–33.0)
MCHC: 34.1 g/dL (ref 31.5–35.7)
MCV: 90 fL (ref 79–97)
Monocytes Absolute: 0.6 10*3/uL (ref 0.1–0.9)
Monocytes: 12 %
Neutrophils Absolute: 3 10*3/uL (ref 1.4–7.0)
Neutrophils: 64 %
Platelets: 269 10*3/uL (ref 150–450)
RBC: 5.26 x10E6/uL (ref 4.14–5.80)
RDW: 13.2 % (ref 11.6–15.4)
WBC: 4.7 10*3/uL (ref 3.4–10.8)

## 2018-06-26 LAB — LIPASE: Lipase: 23 U/L (ref 13–78)

## 2018-06-26 LAB — AMYLASE: Amylase: 83 U/L (ref 31–110)

## 2018-06-26 LAB — H. PYLORI BREATH TEST: H pylori Breath Test: NEGATIVE

## 2018-07-04 ENCOUNTER — Other Ambulatory Visit
Admission: RE | Admit: 2018-07-04 | Discharge: 2018-07-04 | Disposition: A | Discharge status: Home / Self Care | Payer: Managed Care, Other (non HMO) | Source: Ambulatory Visit | Attending: Internal Medicine | Admitting: Internal Medicine

## 2018-07-04 ENCOUNTER — Other Ambulatory Visit: Payer: Self-pay

## 2018-07-04 ENCOUNTER — Other Ambulatory Visit: Admission: RE | Admit: 2018-07-04 | Payer: Managed Care, Other (non HMO) | Source: Ambulatory Visit

## 2018-07-04 DIAGNOSIS — Z01812 Encounter for preprocedural laboratory examination: Secondary | ICD-10-CM | POA: Insufficient documentation

## 2018-07-04 DIAGNOSIS — Z1159 Encounter for screening for other viral diseases: Secondary | ICD-10-CM | POA: Diagnosis not present

## 2018-07-05 LAB — SARS CORONAVIRUS 2 (TAT 6-24 HRS): SARS Coronavirus 2: NEGATIVE

## 2018-07-07 ENCOUNTER — Encounter: Payer: Self-pay | Admitting: Anesthesiology

## 2018-07-08 ENCOUNTER — Encounter: Payer: Self-pay | Admitting: *Deleted

## 2018-07-08 ENCOUNTER — Encounter
Admission: RE | Disposition: A | Discharge status: Home / Self Care | Payer: Self-pay | Source: Home / Self Care | Attending: Internal Medicine

## 2018-07-08 ENCOUNTER — Ambulatory Visit
Admission: RE | Admit: 2018-07-08 | Discharge: 2018-07-08 | Disposition: A | Discharge status: Home / Self Care | Payer: Managed Care, Other (non HMO) | Attending: Internal Medicine | Admitting: Internal Medicine

## 2018-07-08 ENCOUNTER — Other Ambulatory Visit: Payer: Self-pay

## 2018-07-08 ENCOUNTER — Ambulatory Visit: Payer: Managed Care, Other (non HMO) | Admitting: Anesthesiology

## 2018-07-08 DIAGNOSIS — N400 Enlarged prostate without lower urinary tract symptoms: Secondary | ICD-10-CM | POA: Diagnosis not present

## 2018-07-08 DIAGNOSIS — I2510 Atherosclerotic heart disease of native coronary artery without angina pectoris: Secondary | ICD-10-CM | POA: Insufficient documentation

## 2018-07-08 DIAGNOSIS — R634 Abnormal weight loss: Secondary | ICD-10-CM | POA: Insufficient documentation

## 2018-07-08 DIAGNOSIS — Z1211 Encounter for screening for malignant neoplasm of colon: Secondary | ICD-10-CM | POA: Diagnosis not present

## 2018-07-08 DIAGNOSIS — K642 Third degree hemorrhoids: Secondary | ICD-10-CM | POA: Diagnosis not present

## 2018-07-08 DIAGNOSIS — M353 Polymyalgia rheumatica: Secondary | ICD-10-CM | POA: Diagnosis not present

## 2018-07-08 DIAGNOSIS — Z800 Family history of malignant neoplasm of digestive organs: Secondary | ICD-10-CM | POA: Diagnosis not present

## 2018-07-08 DIAGNOSIS — I255 Ischemic cardiomyopathy: Secondary | ICD-10-CM | POA: Insufficient documentation

## 2018-07-08 DIAGNOSIS — I5020 Unspecified systolic (congestive) heart failure: Secondary | ICD-10-CM | POA: Insufficient documentation

## 2018-07-08 DIAGNOSIS — K2970 Gastritis, unspecified, without bleeding: Secondary | ICD-10-CM | POA: Diagnosis not present

## 2018-07-08 DIAGNOSIS — E785 Hyperlipidemia, unspecified: Secondary | ICD-10-CM | POA: Insufficient documentation

## 2018-07-08 DIAGNOSIS — I714 Abdominal aortic aneurysm, without rupture: Secondary | ICD-10-CM | POA: Diagnosis not present

## 2018-07-08 DIAGNOSIS — Z955 Presence of coronary angioplasty implant and graft: Secondary | ICD-10-CM | POA: Diagnosis not present

## 2018-07-08 DIAGNOSIS — K219 Gastro-esophageal reflux disease without esophagitis: Secondary | ICD-10-CM | POA: Insufficient documentation

## 2018-07-08 DIAGNOSIS — F17210 Nicotine dependence, cigarettes, uncomplicated: Secondary | ICD-10-CM | POA: Diagnosis not present

## 2018-07-08 DIAGNOSIS — I252 Old myocardial infarction: Secondary | ICD-10-CM | POA: Diagnosis not present

## 2018-07-08 DIAGNOSIS — K449 Diaphragmatic hernia without obstruction or gangrene: Secondary | ICD-10-CM | POA: Insufficient documentation

## 2018-07-08 HISTORY — PX: COLONOSCOPY WITH PROPOFOL: SHX5780

## 2018-07-08 HISTORY — PX: ESOPHAGOGASTRODUODENOSCOPY (EGD) WITH PROPOFOL: SHX5813

## 2018-07-08 SURGERY — ESOPHAGOGASTRODUODENOSCOPY (EGD) WITH PROPOFOL
Anesthesia: General

## 2018-07-08 MED ORDER — PROPOFOL 500 MG/50ML IV EMUL
INTRAVENOUS | Status: DC | PRN
  Administered 2018-07-08: 160 ug/kg/min via INTRAVENOUS

## 2018-07-08 MED ORDER — SODIUM CHLORIDE 0.9 % IV SOLN
INTRAVENOUS | Status: DC
  Administered 2018-07-08: 08:00:00 via INTRAVENOUS

## 2018-07-08 MED ORDER — PROPOFOL 10 MG/ML IV BOLUS
INTRAVENOUS | Status: DC | PRN
  Administered 2018-07-08: 80 mg via INTRAVENOUS

## 2018-07-08 MED ORDER — PROPOFOL 500 MG/50ML IV EMUL
INTRAVENOUS | Status: AC
  Filled 2018-07-08: qty 50

## 2018-07-08 NOTE — Op Note (Signed)
[facility] Patient Name: Clinton Walsh Procedure Date: 07/08/2018 8:47 AM MRN: [id number] Account #: [id number] Date of Birth: Jan 06, 1960 Admit Type: Outpatient Age: 58 Room: [facility] ENDO ROOM 3 Gender: Male Note Status: Finalized Procedure:            Upper GI endoscopy Indications:          Epigastric abdominal pain, Weight loss Providers:            Benay Pike. Alice Reichert MD, MD Referring MD:         Halina Maidens, MD (Referring MD) Medicines:            Propofol per Anesthesia Complications:        No immediate complications. Procedure:            Pre-Anesthesia Assessment:                       - The risks and benefits of the procedure and the                        sedation options and risks were discussed with the                        patient. All questions were answered and informed                        consent was obtained.                       - Patient identification and proposed procedure were                        verified prior to the procedure by the nurse. The                        procedure was verified in the procedure room.                       - ASA Grade Assessment: III - A patient with severe                        systemic disease.                       - After reviewing the risks and benefits, the patient                        was deemed in satisfactory condition to undergo the                        procedure.                       After obtaining informed consent, the endoscope was                        passed under direct vision. Throughout the procedure,                        the patient's blood pressure, pulse, and oxygen  saturations were monitored continuously. The Endoscope                        was introduced through the mouth, and advanced to the                        third part of duodenum. The upper GI endoscopy was                        accomplished without difficulty. The  patient tolerated                        the procedure well. Findings:      The examined esophagus was normal.      Diffuse minimal inflammation characterized by erythema was found in the       entire examined stomach. Biopsies were taken with a cold forceps for       Helicobacter pylori testing.      The examined duodenum was normal.      A 1 cm hiatal hernia was present. Impression:           - Normal esophagus.                       - Gastritis. Biopsied.                       - Normal examined duodenum.                       - 1 cm hiatal hernia. Recommendation:       - Await pathology results.                       - Proceed with colonoscopy Procedure Code(s):    --- Professional ---                       [phone], Esophagogastroduodenoscopy, flexible, transoral;                        with biopsy, single or multiple Diagnosis Code(s):    --- Professional ---                       R63.4, Abnormal weight loss                       R10.13, Epigastric pain                       K44.9, Diaphragmatic hernia without obstruction or                        gangrene                       K29.70, Gastritis, unspecified, without bleeding CPT copyright 2019 American Medical Association. All rights reserved. The codes documented in this report are preliminary and upon coder review may  be revised to meet current compliance requirements. Efrain Sella MD, MD 07/08/2018 9:06:12 AM This report has been signed electronically. Number of Addenda: 0 Note Initiated On: 07/08/2018 8:47 AM      [facility]

## 2018-07-08 NOTE — Transfer of Care (Signed)
Immediate Anesthesia Transfer of Care Note  Patient: Clinton Walsh  Procedure(s) Performed: ESOPHAGOGASTRODUODENOSCOPY (EGD) WITH PROPOFOL (N/A ) COLONOSCOPY WITH PROPOFOL (N/A )  Patient Location: PACU  Anesthesia Type:General  Level of Consciousness: drowsy  Airway & Oxygen Therapy: Patient Spontanous Breathing and Patient connected to nasal cannula oxygen  Post-op Assessment: Report given to RN and Post -op Vital signs reviewed and stable  Post vital signs: Reviewed and stable  Last Vitals:  Vitals Value Taken Time  BP    Temp    Pulse    Resp    SpO2      Last Pain:  Vitals:   07/08/18 0756  TempSrc: Oral  PainSc: 8       Patients Stated Pain Goal: 0 (67/28/97 9150)  Complications: No apparent anesthesia complications

## 2018-07-08 NOTE — Op Note (Signed)
[facility] Patient Name: Clinton Walsh Procedure Date: 07/08/2018 8:46 AM MRN: [id number] Account #: [id number] Date of Birth: 1960-10-15 Admit Type: Outpatient Age: 58 Room: [facility] ENDO ROOM 3 Gender: Male Note Status: Finalized Procedure:            Colonoscopy Indications:          Screening in patient at increased risk: Family history                        of 1st-degree relative with colorectal cancer Providers:            Benay Pike. Keidan Aumiller MD, MD Medicines:            Propofol per Anesthesia Complications:        No immediate complications. Procedure:            Pre-Anesthesia Assessment:                       - The risks and benefits of the procedure and the                        sedation options and risks were discussed with the                        patient. All questions were answered and informed                        consent was obtained.                       - Patient identification and proposed procedure were                        verified prior to the procedure by the nurse. The                        procedure was verified in the procedure room.                       - ASA Grade Assessment: III - A patient with severe                        systemic disease.                       - After reviewing the risks and benefits, the patient                        was deemed in satisfactory condition to undergo the                        procedure.                       After obtaining informed consent, the colonoscope was                        passed under direct vision. Throughout the procedure,                        the patient's blood pressure, pulse, and oxygen  saturations were monitored continuously. The                        Colonoscope was introduced through the anus and                        advanced to the the terminal ileum, with identification                        of the appendiceal orifice  and IC valve. The terminal                        ileum, ileocecal valve, appendiceal orifice, and rectum                        were photographed. The colonoscopy was performed                        without difficulty. The patient tolerated the procedure                        well. The quality of the bowel preparation was good.                        The terminal ileum, ileocecal valve, appendiceal                        orifice, and rectum were photographed. Findings:      The perianal examination was normal.      The digital rectal exam findings include enlarged prostate. Pertinent       negatives include normal sphincter tone.      The colon (entire examined portion) appeared normal.      Non-bleeding internal hemorrhoids were found during retroflexion. The       hemorrhoids were Grade III (internal hemorrhoids that prolapse but       require manual reduction).      The exam was otherwise without abnormality. Impression:           - Enlarged prostate found on digital rectal exam.                       - The entire examined colon is normal.                       - Non-bleeding internal hemorrhoids.                       - The examination was otherwise normal.                       - No specimens collected. Recommendation:       - Patient has a contact number available for                        emergencies. The signs and symptoms of potential                        delayed complications were discussed with the patient.                        Return  to normal activities tomorrow. Written discharge                        instructions were provided to the patient.                       - Resume previous diet.                       - Continue present medications.                       - Perform a CT scan (computed tomography) of abdomen                        with contrast and pelvis with contrast at appointment                        to be scheduled.                       - Return to  physician assistant in 1 week.                       - The findings and recommendations were discussed with                        the patient. Procedure Code(s):    --- Professional ---                       W8032, Colorectal cancer screening; colonoscopy on                        individual at high risk Diagnosis Code(s):    --- Professional ---                       Z80.0, Family history of malignant neoplasm of                        digestive organs                       K64.2, Third degree hemorrhoids                       N40.0, Benign prostatic hyperplasia without lower                        urinary tract symptoms CPT copyright 2019 American Medical Association. All rights reserved. The codes documented in this report are preliminary and upon coder review may  be revised to meet current compliance requirements. Efrain Sella MD, MD 07/08/2018 9:26:07 AM This report has been signed electronically. Number of Addenda: 0 Note Initiated On: 07/08/2018 8:46 AM Scope Withdrawal Time: 0 hours 5 minutes 52 seconds  Total Procedure Duration: 0 hours 8 minutes 29 seconds  Estimated Blood Loss: Estimated blood loss: none.      [facility]

## 2018-07-08 NOTE — Interval H&P Note (Signed)
History and Physical Interval Note:  07/08/2018 8:50 AM  Clinton Walsh  has presented today for surgery, with the diagnosis of EPIGASTRIC PAIN,ANOREXIA,FAMILY HX.OF COLON CANCER.  The various methods of treatment have been discussed with the patient and family. After consideration of risks, benefits and other options for treatment, the patient has consented to  Procedure(s): ESOPHAGOGASTRODUODENOSCOPY (EGD) WITH PROPOFOL (N/A) COLONOSCOPY WITH PROPOFOL (N/A) as a surgical intervention.  The patient's history has been reviewed, patient examined, no change in status, stable for surgery.  I have reviewed the patient's chart and labs.  Questions were answered to the patient's satisfaction.     [place]la, [place]

## 2018-07-08 NOTE — Anesthesia Postprocedure Evaluation (Signed)
Anesthesia Post Note  Patient: Clinton Walsh  Procedure(s) Performed: ESOPHAGOGASTRODUODENOSCOPY (EGD) WITH PROPOFOL (N/A ) COLONOSCOPY WITH PROPOFOL (N/A )  Patient location during evaluation: Endoscopy Anesthesia Type: General Level of consciousness: awake and alert Pain management: pain level controlled Vital Signs Assessment: post-procedure vital signs reviewed and stable Respiratory status: spontaneous breathing, nonlabored ventilation, respiratory function stable and patient connected to nasal cannula oxygen Cardiovascular status: blood pressure returned to baseline and stable Postop Assessment: no apparent nausea or vomiting Anesthetic complications: no     Last Vitals:  Vitals:   07/08/18 0931 07/08/18 0941  BP: 132/85 (!) 129/92  Pulse: (!) 52 (!) 56  Resp: 16 13  Temp:    SpO2: 99% 97%    Last Pain:  Vitals:   07/08/18 0941  TempSrc:   PainSc: [place]

## 2018-07-08 NOTE — H&P (Signed)
Outpatient short stay form Pre-procedure 07/08/2018 8:47 AM Clinton Walsh K. Alice Reichert, M.D.  Primary Physician: Halina Maidens, M.D.  Reason for visit: Epigastric pain, nausea, anorexia, family hx of colon cancer  History of present illness:  As above. Patient takes [facility] powders for chronic arthralgias related to Polymyalgia rheumatica. Denies change in bowel habits, rectal bleeding. He has lost about 10lb since symptoms began about 6 weeks ago. He says that Pantoprazole bid and Carafate have NOT improved his pain.    Current Facility-Administered Medications:  .  0.9 %  sodium chloride infusion, , Intravenous, Continuous, [place], Benay Pike, MD, Last Rate: 20 mL/hr at 07/08/18 0820  Medications Prior to Admission  Medication Sig Dispense Refill Last Dose  . aspirin EC 81 MG tablet Take 1 tablet (81 mg total) by mouth daily. 30 tablet 0 Past Week at Unknown time  . pantoprazole (PROTONIX) 40 MG tablet Take 1 tablet (40 mg total) by mouth daily. 30 tablet 4 Past Week at Unknown time  . sucralfate (CARAFATE) 1 g tablet Take 1 g by mouth 4 (four) times daily -  with meals and at bedtime.     Marland Kitchen umeclidinium-vilanterol (ANORO ELLIPTA) 62.5-25 MCG/INH AEPB Inhale 1 puff into the lungs every morning. 1 each 10 Past Week at Unknown time  . atorvastatin (LIPITOR) 20 MG tablet Take 1 tablet (20 mg total) by mouth daily. 90 tablet 3      Allergies  Allergen Reactions  . Sulfa Antibiotics Rash     Past Medical History:  Diagnosis Date  . CAD (coronary artery disease)    a.  2008 Cath: nonobs dzs, NL LV;  b. 03/2011 NSTEMI/Cath: 100% D1 (2.25x18 MiniVision BMS), otw nonobs dzs, EF 45-50%;  c. 04/14/11 NSTEMI/PCI: D1 100% in setting of noncompliance (thrombectomy & PTCA);  d. 04/2016 Inf STEMI: RCA 15m (3.0x34 Resolute Integrity DES), otw nonobs dzs; e. 07/2016 PCI: LAD 60 (FFR 0.78-->2.5x23 Xience Alpine DES), D1 30ost, 20p ISR, LCX 50p/m, OM2 70, RCA patent stent.  . HFrEF (heart failure with reduced ejection  fraction) ([place])    a. 04/2016 Echo: EF 30-35%, diff HK, Gr1 DD, triv AI, mild to mod MR;  b. 07/2016 LV gram: EF 35-40%; c. 11/2017 Echo: EF 40-45%, Gr2 DD, mild AI/MR, mild to mod TR.  Marland Kitchen Hyperlipidemia   . Ischemic cardiomyopathy    a. 04/2016 Echo: EF 30-35%, diff HK, Gr1 DD;  b. 07/2016 LV gram: EF 35-40%; c. 11/2017 Echo: EF 40-45%.  . Mediastinal adenopathy    a. 04/2017 CT chest: stable mediastinal and hilar lymphadenopathy w/ speckled Ca2+. Suspicious for sarcoidosis.  . Polymyalgia rheumatica ([place]) 02/2015  . Pulmonary nodules    a. nonspecific nodules in the right middle lobe and right lower lobe by CT; b. 04/2017 CT Chest: Stable RML/RLL nodules - followed by pulm.  . Thoracic ascending aortic aneurysm ([place])    a. 06/2016 CTA: stable 4 cm TAA rec annual f/u.  Marland Kitchen TIA (transient ischemic attack)    a. 10/2017 possible TIA - transient R sided wkns; b. 11/2017 Carotid U/S: nl.  . Tobacco abuse     Review of systems:  Otherwise negative.    Physical Exam  Gen: Alert, oriented. Appears stated age.  HEENT: Tool/AT. PERRLA. Lungs: CTA, no wheezes. CV: RR nl S1, S2. Abd: soft, benign, no masses. BS+ Ext: No edema. Pulses 2+    Planned procedures: Proceed with EGD and colonoscopy. The patient understands the nature of the planned procedure, indications, risks, alternatives and potential  complications including but not limited to bleeding, infection, perforation, damage to internal organs and possible oversedation/side effects from anesthesia. The patient agrees and gives consent to proceed.  Please refer to procedure notes for findings, recommendations and patient disposition/instructions.     Abdulmalik Darco K. Alice Reichert, M.D. Gastroenterology 07/08/2018  8:47 AM

## 2018-07-08 NOTE — Anesthesia Preprocedure Evaluation (Signed)
Anesthesia Evaluation  Patient identified by MRN, date of birth, ID band Patient awake    Reviewed: Allergy & Precautions, NPO status , Patient's Chart, lab work & pertinent test results, reviewed documented beta blocker date and time   Airway Mallampati: II  TM Distance: >3 FB     Dental  (+) Chipped   Pulmonary shortness of breath, COPD, Current Smoker,           Cardiovascular + angina + CAD, + Past MI and + Cardiac Stents       Neuro/Psych TIA   GI/Hepatic GERD  ,  Endo/Other    Renal/GU      Musculoskeletal   Abdominal   Peds  Hematology   Anesthesia Other Findings Smokes. Lung nod. Last echo 40-45. Some weakness on R.  Reproductive/Obstetrics                             Anesthesia Physical Anesthesia Plan  ASA: III  Anesthesia Plan: General   Post-op Pain Management:    Induction: Intravenous  PONV Risk Score and Plan:   Airway Management Planned:   Additional Equipment:   Intra-op Plan:   Post-operative Plan:   Informed Consent: I have reviewed the patients History and Physical, chart, labs and discussed the procedure including the risks, benefits and alternatives for the proposed anesthesia with the patient or authorized representative who has indicated his/her understanding and acceptance.       Plan Discussed with: CRNA  Anesthesia Plan Comments:         Anesthesia Quick Evaluation

## 2018-07-08 NOTE — Anesthesia Post-op Follow-up Note (Signed)
Anesthesia QCDR form completed.        

## 2018-07-09 ENCOUNTER — Encounter: Payer: Self-pay | Admitting: Internal Medicine

## 2018-07-09 LAB — SURGICAL PATHOLOGY

## 2018-07-10 ENCOUNTER — Other Ambulatory Visit: Payer: Self-pay | Admitting: Gastroenterology

## 2018-07-10 DIAGNOSIS — R1013 Epigastric pain: Secondary | ICD-10-CM

## 2018-07-14 ENCOUNTER — Ambulatory Visit
Admission: RE | Admit: 2018-07-14 | Discharge: 2018-07-14 | Disposition: A | Discharge status: Home / Self Care | Payer: Managed Care, Other (non HMO) | Source: Ambulatory Visit | Attending: Gastroenterology | Admitting: Gastroenterology

## 2018-07-14 ENCOUNTER — Other Ambulatory Visit: Payer: Self-pay

## 2018-07-14 DIAGNOSIS — R1013 Epigastric pain: Secondary | ICD-10-CM | POA: Diagnosis not present

## 2018-07-14 MED ORDER — IOHEXOL 300 MG/ML  SOLN
100.0000 mL | Freq: Once | INTRAMUSCULAR | Status: AC | PRN
  Administered 2018-07-14: 100 mL via INTRAVENOUS

## 2018-07-31 ENCOUNTER — Other Ambulatory Visit: Payer: Self-pay | Admitting: Internal Medicine

## 2018-07-31 DIAGNOSIS — J449 Chronic obstructive pulmonary disease, unspecified: Secondary | ICD-10-CM

## 2018-09-11 ENCOUNTER — Other Ambulatory Visit (INDEPENDENT_AMBULATORY_CARE_PROVIDER_SITE_OTHER): Payer: Self-pay | Admitting: Vascular Surgery

## 2018-09-21 NOTE — Progress Notes (Deleted)
Follow-up Outpatient Visit Date: 09/22/2018  Primary Care Provider: Glean Hess, MD [address] [place] [address]  Chief Complaint: ***  HPI:  Clinton Walsh is a 58 y.o. year-old male with history of coronary artery disease status post PCI to diagonal in 8891 complicated by acute stent thrombosis, high risk NSTEMI with subtotal thrombotic occlusion of the proximal/mid RCA in 04/2016, and FFR-guided PCI to mid LAD due to recurrent angina in 07/2016, ischemic cardiomyopathy, lung mass (presumed cancer) followed by pulmonology, and prior tobacco use, who presents for follow-up of coronary artery disease.  He was last seen in our office by Clinton Bayley, NP, in 11/2017, as which time he reported stable dyspnea on exertion and intermittent sharp shooting pains in his chest and abdomen lasting a few seconds at a time.  No further testing or intervention was undertaken at that time.  --------------------------------------------------------------------------------------------------  Cardiovascular History & Procedures: Cardiovascular Problems:  Coronary artery disease  Ischemic cardiomyopathy  Risk Factors:  Known coronary artery disease, male gender, and history of tobacco use  Cath/PCI:  LHC/PCI (07/03/16): LMCA normal.  LAD with 30% proximal and 60% mid stenosis.  LCx with 50% mid stenosis and OM 2 with 70% stenosis.  RCA with 30% ostial disease and widely patent proximal/mid stent.  FFR of mid LAD was 0.78.  Mid LAD successfully treated with Xience Alpine 2.5 x 23 mm drug-eluting stent.  LVEF 35 to 40% with diffuse mid and apical hypokinesis.  LHC/PCI (04/10/16): LMCA normal. LAD with 30% mid vessel disease. D1 with 30% ostial stenosis and patent proximal stent with 20% in-stent restenosis. LCx with 50% proximal/mid disease as well as 40% proximal OM 2 stenosis. Large, dominant RCA with 30% ostial and 90% proximal/mid stenosis with intraluminal thrombus. Successful  IVUS-guided PCI to the proximal/mid RCA with placement of a Resolute Integrity 3.0 x 34 mm drug-eluting stent. LVEDP 13 mmHg  CV Surgery:  None  EP Procedures and Devices:  None  Non-Invasive Evaluation(s):  TTE (04/10/16): Normal LV size with LVEF of 30-35% and hypokinesis of the inferior and inferolateral myocardium. Grade 1 diastolic dysfunction. Trivial aortic regurgitation. Mild to moderate mitral regurgitation. Mildly reduced right ventricular contraction. Normal pulmonary artery pressure.  Recent CV Pertinent Labs: Lab Results  Component Value Date   CHOL 201 (H) 10/23/2017   CHOL 192 04/14/2011   HDL 33 (L) 10/23/2017   HDL 31 (L) 04/14/2011   LDLCALC 134 (H) 10/23/2017   LDLCALC 134 (H) 04/14/2011   TRIG 169 (H) 10/23/2017   TRIG 137 04/14/2011   CHOLHDL 6.1 (H) 10/23/2017   CHOLHDL 5.4 04/11/2016   INR 1.00 06/27/2016   K 4.6 06/25/2018   K 3.9 09/19/2011   MG 1.8 04/15/2011   BUN 17 06/25/2018   BUN 14 09/19/2011   CREATININE 0.96 06/25/2018   CREATININE 0.97 09/19/2011    Past medical and surgical history were reviewed and updated in EPIC.  No outpatient medications have been marked as taking for the 09/22/18 encounter (Appointment) with Clinton Walsh, Clinton Gave, MD.    Allergies: Sulfa antibiotics  Social History   Tobacco Use   Smoking status: Current Every Day Smoker    Packs/day: 1.50    Years: 30.00    Pack years: 45.00    Types: E-cigarettes, Cigarettes    Last attempt to quit: 07/2015    Years since quitting: 3.2   Smokeless tobacco: Never Used   Tobacco comment: quit 7/17, uses vap cigarettes now 6 mg nicotine-just dropped to  3 mg " to make heart Dr happy"per pt  Substance Use Topics   Alcohol use: No    Alcohol/week: 0.0 standard drinks    Comment: none x 10y   Drug use: No    Family History  Problem Relation Age of Onset   Other Father        "Heart problems" pacemaker   Prostate cancer Father    Heart attack Father    Other  Mother        "heart problems"   Heart attack Mother    Other Maternal Grandfather        "heart exploded" in his 16s    Review of Systems: A 12-system review of systems was performed and was negative except as noted in the HPI.  --------------------------------------------------------------------------------------------------  Physical Exam: There were no vitals taken for this visit.  General:  *** HEENT: No conjunctival pallor or scleral icterus. Moist mucous membranes.  OP clear. Neck: Supple without lymphadenopathy, thyromegaly, JVD, or HJR. No carotid bruit. Lungs: Normal work of breathing. Clear to auscultation bilaterally without wheezes or crackles. Heart: Regular rate and rhythm without murmurs, rubs, or gallops. Non-displaced PMI. Abd: Bowel sounds present. Soft, NT/ND without hepatosplenomegaly Ext: No lower extremity edema. Radial, PT, and DP pulses are 2+ bilaterally. Skin: Warm and dry without rash.  EKG:  ***  Lab Results  Component Value Date   WBC 4.7 06/25/2018   HGB 16.1 06/25/2018   HCT 47.2 06/25/2018   MCV 90 06/25/2018   PLT 269 06/25/2018    Lab Results  Component Value Date   NA 141 06/25/2018   K 4.6 06/25/2018   CL 103 06/25/2018   CO2 24 06/25/2018   BUN 17 06/25/2018   CREATININE 0.96 06/25/2018   GLUCOSE 78 06/25/2018   ALT 13 06/25/2018    Lab Results  Component Value Date   CHOL 201 (H) 10/23/2017   HDL 33 (L) 10/23/2017   LDLCALC 134 (H) 10/23/2017   TRIG 169 (H) 10/23/2017   CHOLHDL 6.1 (H) 10/23/2017    --------------------------------------------------------------------------------------------------  ASSESSMENT AND PLAN: [name], MD 09/21/2018 9:36 PM

## 2018-09-22 ENCOUNTER — Ambulatory Visit: Payer: Managed Care, Other (non HMO) | Admitting: Internal Medicine

## 2018-10-23 ENCOUNTER — Other Ambulatory Visit (INDEPENDENT_AMBULATORY_CARE_PROVIDER_SITE_OTHER): Payer: Self-pay | Admitting: Vascular Surgery

## 2018-10-24 ENCOUNTER — Other Ambulatory Visit (INDEPENDENT_AMBULATORY_CARE_PROVIDER_SITE_OTHER): Payer: Self-pay | Admitting: Vascular Surgery

## 2018-11-14 ENCOUNTER — Other Ambulatory Visit: Payer: Self-pay

## 2018-11-14 MED ORDER — ATORVASTATIN CALCIUM 20 MG PO TABS: 20.0000 mg | ORAL_TABLET | Freq: Every day | ORAL | 0 refills | Status: DC

## 2018-11-14 NOTE — Telephone Encounter (Signed)
*  STAT* If patient is at the pharmacy, call can be transferred to refill team.   1. Which medications need to be refilled? (please list name of each medication and dose if known)  Lipitor  2. Which pharmacy/location (including street and city if local pharmacy) is medication to be sent to? [facility]  3. Do they need a 30 day or 90 day supply? 90 Patient is completely out

## 2018-11-17 NOTE — Progress Notes (Signed)
Follow-up Outpatient Visit Date: 11/19/2018  Primary Care Provider: Glean Hess, [address] [place] [address]  Chief Complaint: Chest pain  HPI:  Clinton Walsh is a 58 y.o. year-old male with history of coronary artery disease status post PCI to diagonal in [id number] complicated by acute stent thrombosis, high risk NSTEMI with subtotal thrombotic occlusion of the proximal/mid RCA in 04/2016, and FFR-guided PCI to mid LAD due to recurrent angina in 07/2016, ischemic cardiomyopathy, AAA (4.3 cm by CT in 07/2018), lung mass followed by pulmonology, and prior tobacco use, who presents for follow-up of coronary artery disease.  He was last seen in our office in 11/2017 by Ignacia Bayley, NP.  At that time, he endorsed stable chronic dyspnea as well as intermittent sharp and shooting chest and upper abdominal pain.  The only medications that he was taking were aspirin and atorvastatin.  He declined addition of a beta blocker for treatment of his ischemic cardiomyopathy (LEF 40-45%).  The summer, he was seen by GI for recurrent abdominal pain.  Upper endoscopy revealed gastritis.  He was placed on a PPI with subsequent resolution of his symptoms.  Today, [name] reports that he has been experiencing intermittent chest pain of varying qualities in the locations.  Frequently, the pain is located below the left axilla and feels as though somebody is shocking him for a second.  At other times, he feels a sharp pain coinciding to each heartbeat, which can last minutes to hours at a time.  There are no clear precipitating factors.  He denies exertional chest pain.  The discomfort has been off and on over the last 2 weeks.  He has not taken any nitroglycerin.  Chronic dyspnea is unchanged.  He denies orthopnea, PND, edema, and lightheadedness.  He had to quit using cigarettes since our last visit and was vaping.  However, he felt like his shortness of breath was worse with vaping and he is  now back to smoking cigarettes.  --------------------------------------------------------------------------------------------------  Cardiovascular History & Procedures: Cardiovascular Problems:  Coronary artery disease  Ischemic cardiomyopathy  Risk Factors:  Known coronary artery disease, male gender, and history of tobacco use  Cath/PCI:  LHC/PCI (07/03/16): LMCA normal.  LAD with 30% proximal and 60% mid stenosis.  LCx with 50% mid stenosis and OM 2 with 70% stenosis.  RCA with 30% ostial disease and widely patent proximal/mid stent.  FFR of mid LAD was 0.78.  Mid LAD successfully treated with Xience Alpine 2.5 x 23 mm drug-eluting stent.  LVEF 35 to 40% with diffuse mid and apical hypokinesis.  LHC/PCI (04/10/16): LMCA normal. LAD with 30% mid vessel disease. D1 with 30% ostial stenosis and patent proximal stent with 20% in-stent restenosis. LCx with 50% proximal/mid disease as well as 40% proximal OM 2 stenosis. Large, dominant RCA with 30% ostial and 90% proximal/mid stenosis with intraluminal thrombus. Successful IVUS-guided PCI to the proximal/mid RCA with placement of a Resolute Integrity 3.0 x 34 mm drug-eluting stent. LVEDP 13 mmHg  CV Surgery:  None  EP Procedures and Devices:  None  Non-Invasive Evaluation(s):  Carotid Doppler (11/06/17): No significant stenosis in either carotid artery.  Antegrade vertebral artery flow bilaterally.  Normal subclavian artery flow.  TTE (11/06/17): Normal LV size with LVEF 40-45% and lateral/inferolateral hypokinesis.  Grade 2 diastolic dysfunction.  Mild aortic and mitral regurgitation.  Mild to moderate tricuspid regurgitation.  TTE (04/10/16): Normal LV size with LVEF of 30-35% and hypokinesis of the inferior and  inferolateral myocardium. Grade 1 diastolic dysfunction. Trivial aortic regurgitation. Mild to moderate mitral regurgitation. Mildly reduced right ventricular contraction. Normal pulmonary artery pressure.  Recent CV  Pertinent Labs: Lab Results  Component Value Date   CHOL 201 (H) 10/23/2017   CHOL 192 04/14/2011   HDL 33 (L) 10/23/2017   HDL 31 (L) 04/14/2011   LDLCALC 134 (H) 10/23/2017   LDLCALC 134 (H) 04/14/2011   TRIG 169 (H) 10/23/2017   TRIG 137 04/14/2011   CHOLHDL 6.1 (H) 10/23/2017   CHOLHDL 5.4 04/11/2016   INR 1.00 06/27/2016   K 4.6 06/25/2018   K 3.9 09/19/2011   MG 1.8 04/15/2011   BUN 17 06/25/2018   BUN 14 09/19/2011   CREATININE 0.96 06/25/2018   CREATININE 0.97 09/19/2011    Past medical and surgical history were reviewed and updated in EPIC.  Current Meds  Medication Sig  . ANORO ELLIPTA 62.5-25 MCG/INH AEPB INHALE 1 PUFF INTO THE LUNGS EVERY MORNING.  Marland Kitchen aspirin EC 81 MG tablet Take 1 tablet (81 mg total) by mouth daily.  Marland Kitchen atorvastatin (LIPITOR) 20 MG tablet Take 1 tablet (20 mg total) by mouth daily.    Allergies: Sulfa antibiotics  Social History   Tobacco Use  . Smoking status: Current Every Day Smoker    Packs/day: 1.50    Years: 30.00    Pack years: 45.00    Types: E-cigarettes, Cigarettes    Last attempt to quit: 07/2015    Years since quitting: 3.3  . Smokeless tobacco: Never Used  . Tobacco comment: quit 7/17, uses vap cigarettes now 6 mg nicotine-just dropped to 3 mg " to make heart Dr happy"per pt  Substance Use Topics  . Alcohol use: No    Alcohol/week: 0.0 standard drinks    Comment: none x 10y  . Drug use: No    Family History  Problem Relation Age of Onset  . Other Father        "Heart problems" pacemaker  . Prostate cancer Father   . Heart attack Father   . Other Mother        "heart problems"  . Heart attack Mother   . Other Maternal Grandfather        "heart exploded" in his 43s    Review of Systems: A 12-system review of systems was performed and was negative except as noted in the HPI.  --------------------------------------------------------------------------------------------------  Physical Exam: BP 120/82 (BP  Location: Left Arm, Patient Position: Sitting, Cuff Size: Normal)   Pulse 72   Ht 5\' 5"  (1.651 m)   Wt 117 lb 12 oz (53.4 kg)   SpO2 98%   BMI 19.59 kg/m   General: Thin man, seated in the exam room. HEENT: No conjunctival pallor or scleral icterus. Moist mucous membranes.  OP clear. Neck: Supple without lymphadenopathy, thyromegaly, JVD, or HJR. Lungs: Normal work of breathing.  Mildly diminished breath sounds throughout with scattered faint wheezes.  No crackles. Heart: Regular rate and rhythm with occasional extrasystoles.  No murmurs, rubs, or gallops. Non-displaced PMI. Abd: Bowel sounds present. Soft, NT/ND without hepatosplenomegaly Ext: No lower extremity edema.  1+ radial and pedal pulses bilaterally. Skin: Warm and dry without rash.  EKG: Normal sinus rhythm with frequent PVCs, left anterior fascicular block, and nonspecific T wave changes.  Compared with prior tracing from 10/23/2017, PVCs are new.  Lab Results  Component Value Date   WBC 4.7 06/25/2018   HGB 16.1 06/25/2018   HCT 47.2 06/25/2018   MCV  90 06/25/2018   PLT 269 06/25/2018    Lab Results  Component Value Date   NA 141 06/25/2018   K 4.6 06/25/2018   CL 103 06/25/2018   CO2 24 06/25/2018   BUN 17 06/25/2018   CREATININE 0.96 06/25/2018   GLUCOSE 78 06/25/2018   ALT 13 06/25/2018    Lab Results  Component Value Date   CHOL 201 (H) 10/23/2017   HDL 33 (L) 10/23/2017   LDLCALC 134 (H) 10/23/2017   TRIG 169 (H) 10/23/2017   CHOLHDL 6.1 (H) 10/23/2017    --------------------------------------------------------------------------------------------------  ASSESSMENT AND PLAN: Coronary artery disease with atypical angina: Mr. Battie reports brief, electrical chest pain over the last few weeks.  This is notably different than what he experienced at the time of his MIs.  EKG today is notable for frequent PVCs but otherwise no change.  I wonder if he may be experiencing discomfort related to the  PVCs.  I have recommended addition of metoprolol succinate 12.5 mg daily.  If symptoms do not improve, we will need to consider ischemia testing.  Mr. Spiewak should continue aspirin and atorvastatin for secondary prevention.  PVCs: Frequent PVCs noted today.  We will add metoprolol succinate 12.5 mg daily as well as check a complete metabolic panel, level, and TSH.  Chronic heart failure with reduced ejection fraction: Most recent echo in 2019 showed an LVEF of 40-45%.  Mr. Hunnell appears euvolemic on exam.  He has chronic exertional dyspnea consistent with NYHA class II-III heart failure.  However, underlying lung disease secondary to extensive and ongoing smoking history is likely contributing.  We will add low-dose metoprolol, as above.  I have encouraged Mr. Worthington to quit smoking, though he is not interested.  Hyperlipidemia: Continue atorvastatin.  Check lipid and CMP today.  Follow-up: Return to clinic in 1 month.  [name], MD 11/19/2018 11:47 AM

## 2018-11-18 ENCOUNTER — Ambulatory Visit
Admission: RE | Admit: 2018-11-18 | Discharge: 2018-11-18 | Disposition: A | Discharge status: Home / Self Care | Payer: Managed Care, Other (non HMO) | Source: Ambulatory Visit | Attending: Internal Medicine | Admitting: Internal Medicine

## 2018-11-18 ENCOUNTER — Other Ambulatory Visit: Payer: Self-pay

## 2018-11-18 DIAGNOSIS — R918 Other nonspecific abnormal finding of lung field: Secondary | ICD-10-CM | POA: Insufficient documentation

## 2018-11-19 ENCOUNTER — Ambulatory Visit (INDEPENDENT_AMBULATORY_CARE_PROVIDER_SITE_OTHER): Payer: Managed Care, Other (non HMO) | Admitting: Internal Medicine

## 2018-11-19 ENCOUNTER — Encounter: Payer: Self-pay | Admitting: Internal Medicine

## 2018-11-19 VITALS — BP 120/82 | HR 72 | Ht 65.0 in | Wt 117.8 lb

## 2018-11-19 DIAGNOSIS — I493 Ventricular premature depolarization: Secondary | ICD-10-CM | POA: Diagnosis not present

## 2018-11-19 DIAGNOSIS — E785 Hyperlipidemia, unspecified: Secondary | ICD-10-CM

## 2018-11-19 DIAGNOSIS — I5022 Chronic systolic (congestive) heart failure: Secondary | ICD-10-CM

## 2018-11-19 DIAGNOSIS — I25119 Atherosclerotic heart disease of native coronary artery with unspecified angina pectoris: Secondary | ICD-10-CM

## 2018-11-19 MED ORDER — METOPROLOL SUCCINATE ER 25 MG PO TB24: 12.5000 mg | ORAL_TABLET | Freq: Every day | ORAL | 2 refills | Status: DC

## 2018-11-19 NOTE — Patient Instructions (Signed)
Medication Instructions:  Your physician has recommended you make the following change in your medication:  1- START Metoprolol succincate 12.5 mg (0.5 tablet) by mouth once a day.  *If you need a refill on your cardiac medications before your next appointment, please call your pharmacy*  Lab Work: Your physician recommends that you return for lab work in: TODAY - CBC, CMET, LIPID, TSH.  If you have labs (blood work) drawn today and your tests are completely normal, you will receive your results only by: Marland Kitchen MyChart Message (if you have MyChart) OR . A paper copy in the mail If you have any lab test that is abnormal or we need to change your treatment, we will call you to review the results.  Testing/Procedures: none  Follow-Up: At [facility], you and your health needs are our priority.  As part of our continuing mission to provide you with exceptional heart care, we have created designated Provider Care Teams.  These Care Teams include your primary Cardiologist (physician) and Advanced Practice Providers (APPs -  Physician Assistants and Nurse Practitioners) who all work together to provide you with the care you need, when you need it.  Your next appointment:   1 month(s)  The format for your next appointment:   In Person  Provider:    You may see one of the following Advanced Practice Providers on your designated Care Team:    Murray Hodgkins, NP  Christell Faith, PA-C  Marrianne Mood, PA-C

## 2018-11-20 ENCOUNTER — Telehealth: Payer: Self-pay | Admitting: Internal Medicine

## 2018-11-20 ENCOUNTER — Other Ambulatory Visit: Payer: Self-pay

## 2018-11-20 DIAGNOSIS — E785 Hyperlipidemia, unspecified: Secondary | ICD-10-CM

## 2018-11-20 DIAGNOSIS — I493 Ventricular premature depolarization: Secondary | ICD-10-CM | POA: Insufficient documentation

## 2018-11-20 LAB — COMPREHENSIVE METABOLIC PANEL
ALT: 10 IU/L (ref 0–44)
AST: 17 IU/L (ref 0–40)
Albumin/Globulin Ratio: 1.4 (ref 1.2–2.2)
Albumin: 4.3 g/dL (ref 3.8–4.9)
Alkaline Phosphatase: 102 IU/L (ref 39–117)
BUN/Creatinine Ratio: 12 (ref 9–20)
BUN: 13 mg/dL (ref 6–24)
Bilirubin Total: 0.4 mg/dL (ref 0.0–1.2)
CO2: 22 mmol/L (ref 20–29)
Calcium: 9.5 mg/dL (ref 8.7–10.2)
Chloride: 103 mmol/L (ref 96–106)
Creatinine, Ser: 1.08 mg/dL (ref 0.76–1.27)
GFR calc Af Amer: 88 mL/min/{1.73_m2} (ref >59)
GFR calc non Af Amer: 76 mL/min/{1.73_m2} (ref >59)
Globulin, Total: 3.1 g/dL (ref 1.5–4.5)
Glucose: 89 mg/dL (ref 65–99)
Potassium: 5.2 mmol/L (ref 3.5–5.2)
Sodium: 139 mmol/L (ref 134–144)
Total Protein: 7.4 g/dL (ref 6.0–8.5)

## 2018-11-20 LAB — LIPID PANEL
Chol/HDL Ratio: 4.3 ratio (ref 0.0–5.0)
Cholesterol, Total: 154 mg/dL (ref 100–199)
HDL: 36 mg/dL — ABNORMAL LOW (ref >39)
LDL Chol Calc (NIH): 104 mg/dL — ABNORMAL HIGH (ref 0–99)
Triglycerides: 69 mg/dL (ref 0–149)
VLDL Cholesterol Cal: 14 mg/dL (ref 5–40)

## 2018-11-20 LAB — CBC
Hematocrit: 46.6 % (ref 37.5–51.0)
Hemoglobin: 16.6 g/dL (ref 13.0–17.7)
MCH: 32.2 pg (ref 26.6–33.0)
MCHC: 35.6 g/dL (ref 31.5–35.7)
MCV: 91 fL (ref 79–97)
Platelets: 299 10*3/uL (ref 150–450)
RBC: 5.15 x10E6/uL (ref 4.14–5.80)
RDW: 13.2 % (ref 11.6–15.4)
WBC: 4.3 10*3/uL (ref 3.4–10.8)

## 2018-11-20 LAB — TSH: TSH: 2.31 u[IU]/mL (ref 0.450–4.500)

## 2018-11-20 MED ORDER — ATORVASTATIN CALCIUM 80 MG PO TABS: 80.0000 mg | ORAL_TABLET | Freq: Every day | ORAL | 3 refills | Status: DC

## 2018-11-20 NOTE — Telephone Encounter (Signed)
-----   Message from Theora Gianotti, NP sent at 11/20/2018  9:49 AM EST ----- LDL 104, which is still above goal.  Ideally would escalate lipitor to 80mg  daily and repeat lipids/lft's in ~ 6 wks.

## 2018-11-20 NOTE — Telephone Encounter (Signed)
The patient has been notified of the result and recommendations to increase atorvastatin to 80 mg QD. LIPIDS and LFTs ordered to be done at the [place] at [facility] the last week of December. Patient verbalized understanding. All questions (if any) were answered.  Cleon Gustin, RN 11/20/2018 10:44 AM

## 2018-11-20 NOTE — Telephone Encounter (Signed)
Patient is returing your call regarding lab results.

## 2018-11-21 ENCOUNTER — Ambulatory Visit (INDEPENDENT_AMBULATORY_CARE_PROVIDER_SITE_OTHER): Payer: Managed Care, Other (non HMO) | Admitting: Primary Care

## 2018-11-21 ENCOUNTER — Other Ambulatory Visit: Payer: Self-pay

## 2018-11-21 ENCOUNTER — Encounter: Payer: Self-pay | Admitting: Primary Care

## 2018-11-21 VITALS — BP 122/80 | HR 82 | Temp 98.6°F | Ht 65.0 in | Wt 119.2 lb

## 2018-11-21 DIAGNOSIS — R9389 Abnormal findings on diagnostic imaging of other specified body structures: Secondary | ICD-10-CM

## 2018-11-21 DIAGNOSIS — J449 Chronic obstructive pulmonary disease, unspecified: Secondary | ICD-10-CM

## 2018-11-21 DIAGNOSIS — F17200 Nicotine dependence, unspecified, uncomplicated: Secondary | ICD-10-CM

## 2018-11-21 DIAGNOSIS — R918 Other nonspecific abnormal finding of lung field: Secondary | ICD-10-CM | POA: Diagnosis not present

## 2018-11-21 MED ORDER — BREO ELLIPTA 100-25 MCG/INH IN AEPB: 1.0000 | INHALATION_SPRAY | Freq: Every day | RESPIRATORY_TRACT | 0 refills | Status: AC

## 2018-11-21 NOTE — Progress Notes (Signed)
@Patient  ID: Clinton Walsh, male    DOB: [birth year], 58 y.o.   MRN: [id number]  Chief Complaint  Patient presents with  . Follow-up    CT 11/18/2018- prod cough with white mucus, wheezing and sob with exertion.     Referring provider: Glean Hess, MD  HPI: 58 year old male, current every day smoker (tobacco/vaping). PMH significant for COPD GOLD D, solitary LUL pulmonary nodule, CAD. Patient of Dr. Mortimer Fries, last seen on 05/29/18. CT chest 05/22/18 showed multiple pulmonary nodules bilaterally which are all stable. Plan ongoing CT scan every 6 months, if change in appearance of nodule he will need EBUS bronchoscopy. Maintained on Anoro. On nocturnal oxygen.   11/21/2018 Patient presents today for 6 month follow-up. Repeat CT chest showed stable bilateral pulmonary nodules, moderate to severe emphysematous changes. Feels his breathing is worse. Reports productive cough with thick white mucus. He has associated chest tightness and shortness of breath with activity. Taking Anoro as prescribed. Not wearing oxygen at night anymore, returned it. He is still smoking, no interest in quitting at this time. CAT score 27.    Imaging: PET scan on 04/26/16    Interpretation:LUL nodular scarring with SUV 2.7 mediastinal adenopathy  Repeat CT chest on 06/28/16 shows a stable persistent left upper lobe nodule No significant change in size  Repeat CT chest 04/03/2017 Shows stable nodules No increase in size Severe emphysema noted  CT chest 05/2018 1. No acute abnormality detected. 2. Multiple pulmonary nodules are again identified bilaterally, all of which are stable from prior study. No new worrisome pulmonary nodules identified on today's exam. 3. Ectatic ascending thoracic aorta measuring 4.3 cm (previously measuring 4 cm). Recommend annual imaging followup by CTA or MRA  Pulmonary testing: Office spiro shows moderate to severe obstruction ratio48% fev1 2L 63% fef25/75 0.8L 29%  ONO+hypoxia  Allergies  Allergen Reactions  . Sulfa Antibiotics Rash    There is no immunization history for the selected administration types on file for this patient.  Past Medical History:  Diagnosis Date  . CAD (coronary artery disease)    a.  2008 Cath: nonobs dzs, NL LV;  b. 03/2011 NSTEMI/Cath: 100% D1 (2.25x18 MiniVision BMS), otw nonobs dzs, EF 45-50%;  c. 04/14/11 NSTEMI/PCI: D1 100% in setting of noncompliance (thrombectomy & PTCA);  d. 04/2016 Inf STEMI: RCA 43m (3.0x34 Resolute Integrity DES), otw nonobs dzs; e. 07/2016 PCI: LAD 60 (FFR 0.78-->2.5x23 Xience Alpine DES), D1 30ost, 20p ISR, LCX 50p/m, OM2 70, RCA patent stent.  . HFrEF (heart failure with reduced ejection fraction) ([place])    a. 04/2016 Echo: EF 30-35%, diff HK, Gr1 DD, triv AI, mild to mod MR;  b. 07/2016 LV gram: EF 35-40%; c. 11/2017 Echo: EF 40-45%, Gr2 DD, mild AI/MR, mild to mod TR.  Marland Kitchen Hyperlipidemia   . Ischemic cardiomyopathy    a. 04/2016 Echo: EF 30-35%, diff HK, Gr1 DD;  b. 07/2016 LV gram: EF 35-40%; c. 11/2017 Echo: EF 40-45%.  . Mediastinal adenopathy    a. 04/2017 CT chest: stable mediastinal and hilar lymphadenopathy w/ speckled Ca2+. Suspicious for sarcoidosis.  . Polymyalgia rheumatica ([place]) 02/2015  . Pulmonary nodules    a. nonspecific nodules in the right middle lobe and right lower lobe by CT; b. 04/2017 CT Chest: Stable RML/RLL nodules - followed by pulm.  . Thoracic ascending aortic aneurysm ([place])    a. 06/2016 CTA: stable 4 cm TAA rec annual f/u.  Marland Kitchen TIA (transient ischemic attack)  a. 10/2017 possible TIA - transient R sided wkns; b. 11/2017 Carotid U/S: nl.  . Tobacco abuse     Tobacco History: Social History   Tobacco Use  Smoking Status Current Every Day Smoker  . Packs/day: 1.50  . Years: 30.00  . Pack years: 45.00  . Types: E-cigarettes, Cigarettes  . Last attempt to quit: 07/2015  . Years since quitting: 3.4  Smokeless Tobacco Never Used  Tobacco Comment   currently smoking  0.5 pack daily- 11/21/2018   Ready to quit: Not Answered Counseling given: Not Answered Comment: currently smoking 0.5 pack daily- 11/21/2018   Outpatient Medications Prior to Visit  Medication Sig Dispense Refill  . ANORO ELLIPTA 62.5-25 MCG/INH AEPB INHALE 1 PUFF INTO THE LUNGS EVERY MORNING. 180 each 0  . aspirin EC 81 MG tablet Take 1 tablet (81 mg total) by mouth daily. 30 tablet 0  . atorvastatin (LIPITOR) 80 MG tablet Take 1 tablet (80 mg total) by mouth daily. 90 tablet 3  . metoprolol succinate (TOPROL XL) 25 MG 24 hr tablet Take 0.5 tablets (12.5 mg total) by mouth daily. 45 tablet 2  . nitroGLYCERIN (NITROSTAT) 0.4 MG SL tablet Place 0.4 mg under the tongue every 5 (five) minutes as needed for chest pain.     No facility-administered medications prior to visit.    Review of Systems  Review of Systems  Constitutional: Negative.   HENT: Negative.   Respiratory: Positive for cough, chest tightness, shortness of breath and wheezing.   Cardiovascular: Negative.    Physical Exam  BP 122/80 (BP Location: Left Arm, Cuff Size: Normal)   Pulse 82   Temp 98.6 F (37 C) (Temporal)   Ht 5\' 5"  (1.651 m)   Wt 119 lb 3.2 oz (54.1 kg)   SpO2 95%   BMI 19.84 kg/m  Physical Exam Constitutional:      Appearance: Normal appearance.  HENT:     Head: Normocephalic and atraumatic.  Cardiovascular:     Rate and Rhythm: Normal rate and regular rhythm.  Pulmonary:     Effort: Pulmonary effort is normal.  Musculoskeletal: Normal range of motion.  Skin:    General: Skin is warm and dry.  Neurological:     General: No focal deficit present.     Mental Status: He is alert and oriented to person, place, and time. Mental status is at baseline.  Psychiatric:        Mood and Affect: Mood normal.        Behavior: Behavior normal.        Thought Content: Thought content normal.        Judgment: Judgment normal.      Lab Results:  CBC    Component Value Date/Time   WBC 4.3  11/19/2018 1218   WBC 3.4 (L) 07/04/2016 0502   RBC 5.15 11/19/2018 1218   RBC 4.19 (L) 07/04/2016 0502   HGB 16.6 11/19/2018 1218   HCT 46.6 11/19/2018 1218   PLT 299 11/19/2018 1218   MCV 91 11/19/2018 1218   MCV 91 09/19/2011 0243   MCH 32.2 11/19/2018 1218   MCH 30.2 07/04/2016 0502   MCHC 35.6 11/19/2018 1218   MCHC 34.2 07/04/2016 0502   RDW 13.2 11/19/2018 1218   RDW 13.4 09/19/2011 0243   LYMPHSABS 1.0 06/25/2018 1410   LYMPHSABS 1.5 09/19/2011 0243   MONOABS 0.4 06/27/2016 1631   MONOABS 0.6 09/19/2011 0243   EOSABS 0.1 06/25/2018 1410   EOSABS 0.2 09/19/2011 0243  BASOSABS 0.0 06/25/2018 1410   BASOSABS 0.0 09/19/2011 0243    BMET    Component Value Date/Time   NA 139 11/19/2018 1218   NA 142 09/19/2011 0243   K 5.2 11/19/2018 1218   K 3.9 09/19/2011 0243   CL 103 11/19/2018 1218   CL 108 (H) 09/19/2011 0243   CO2 22 11/19/2018 1218   CO2 26 09/19/2011 0243   GLUCOSE 89 11/19/2018 1218   GLUCOSE 92 07/04/2016 0502   GLUCOSE 92 09/19/2011 0243   BUN 13 11/19/2018 1218   BUN 14 09/19/2011 0243   CREATININE 1.08 11/19/2018 1218   CREATININE 0.97 09/19/2011 0243   CALCIUM 9.5 11/19/2018 1218   CALCIUM 8.4 (L) 09/19/2011 0243   GFRNONAA 76 11/19/2018 1218   GFRNONAA >60 09/19/2011 0243   GFRAA 88 11/19/2018 1218   GFRAA >60 09/19/2011 0243    BNP No results found for: BNP  ProBNP No results found for: PROBNP  Imaging: Ct Chest Wo Contrast  Result Date: 11/18/2018 CLINICAL DATA:  Lung nodule follow-up EXAM: CT CHEST WITHOUT CONTRAST TECHNIQUE: Multidetector CT imaging of the chest was performed following the standard protocol without IV contrast. COMPARISON:  CT dated May 22, 2018 FINDINGS: Cardiovascular: Aortic calcifications are noted. Again noted is a thoracic aortic aneurysm of the ascending aorta measuring approximately 4.1 cm, stable from prior study. Coronary artery calcifications are noted. There is no significant pericardial effusion. The  heart size is normal. Mediastinum/Nodes: --No mediastinal or hilar lymphadenopathy. --No axillary lymphadenopathy. --No supraclavicular lymphadenopathy. --Normal thyroid gland. --The esophagus is unremarkable Lungs/Pleura: Moderate severe emphysematous changes are noted bilaterally. Again identified is a branching opacity in the left upper lobe which is stable from prior study and is favored to represent inspissated mucous. There is a stable perifissural nodule in the right middle lobe. There is a stable peripheral pulmonary nodule measuring approximately 5 mm in the right middle lobe. There is a stable peripheral pulmonary nodule in the right lower lobe measuring approximately 5 mm. There is scarring versus atelectasis at the lung bases. There is no new pulmonary nodule identified on this exam. No pneumothorax. No large pleural effusion. Scattered calcified granulomas are noted bilaterally. Upper Abdomen: Again noted is a partially visualized abdominal aortic aneurysm, better appreciated on prior CT. There are multiple non obstructing calcified stones in the upper pole of the right kidney. There may be a nonobstructing partially visualized stone in the interpolar region of the left kidney. Musculoskeletal: No chest wall abnormality. No acute or significant osseous findings. Review of the MIP images confirms the above findings. IMPRESSION: 1. Stable bilateral pulmonary nodules as detailed above. 2. Stable ascending thoracic aortic aneurysm currently measuring approximately 4.1 cm. Recommend annual imaging followup by CTA or MRA. This recommendation follows 2010 ACCF/AHA/AATS/ACR/ASA/SCA/SCAI/SIR/STS/SVM Guidelines for the Diagnosis and Management of Patients with Thoracic Aortic Disease. Circulation. 2010; 121: O676-H209. Aortic aneurysm NOS (ICD10-I71.9) Aortic Atherosclerosis (ICD10-I70.0) and Emphysema (ICD10-J43.9). Electronically Signed   By: Constance Holster M.D.   On: 11/18/2018 18:31     Assessment &  Plan:   COPD, group D, by GOLD 2017 classification ([place]) - Feels breathing is worse, reports increased wheezing and shortness of breath - Stop Anoro; Start Trelegy 1 puff daily - Start Combivent inhaler - 1 puff every 6 hours for sob/wheezing - Continue to encourage that you quit smoking   Abnormal CT of the chest - CT chest 11/18/18 showed stable bilateral pulmonary nodules, moderate to severe emphysematous changes - Plan repeat CT chest wo  contrast in 6 months re: pulmonary nodules  Tobacco use disorder - He is still smoking, no interest in quitting at this time - Continue to encourage smoking cessation    Martyn Ehrich, NP 12/01/2018

## 2018-11-21 NOTE — Patient Instructions (Signed)
Pleasure meeting you today Clinton Walsh  Recommendations: Stop Anoro Start Trelegy 1 puff daily Start Combivent inhaler - 1 puff every 6 hours for sob/wheezing Continue to encourage that you quit smoking   Orders: CT chest wo contrast in 6 months re: pulmonary nodules  Follow-up: 6 months with Dr. Mortimer Fries after CT scan     Steps to Quit Smoking Smoking tobacco is the leading cause of preventable death. It can affect almost every organ in the body. Smoking puts you and people around you at risk for many serious, long-lasting (chronic) diseases. Quitting smoking can be hard, but it is one of the best things that you can do for your health. It is never too late to quit. How do I get ready to quit? When you decide to quit smoking, make a plan to help you succeed. Before you quit:  Pick a date to quit. Set a date within the next 2 weeks to give you time to prepare.  Write down the reasons why you are quitting. Keep this list in places where you will see it often.  Tell your family, friends, and co-workers that you are quitting. Their support is important.  Talk with your doctor about the choices that may help you quit.  Find out if your health insurance will pay for these treatments.  Know the people, places, things, and activities that make you want to smoke (triggers). Avoid them. What first steps can I take to quit smoking?  Throw away all cigarettes at home, at work, and in your car.  Throw away the things that you use when you smoke, such as ashtrays and lighters.  Clean your car. Make sure to empty the ashtray.  Clean your home, including curtains and carpets. What can I do to help me quit smoking? Talk with your doctor about taking medicines and seeing a counselor at the same time. You are more likely to succeed when you do both.  If you are pregnant or breastfeeding, talk with your doctor about counseling or other ways to quit smoking. Do not take medicine to help you  quit smoking unless your doctor tells you to do so. To quit smoking: Quit right away  Quit smoking totally, instead of slowly cutting back on how much you smoke over a period of time.  Go to counseling. You are more likely to quit if you go to counseling sessions regularly. Take medicine You may take medicines to help you quit. Some medicines need a prescription, and some you can buy over-the-counter. Some medicines may contain a drug called nicotine to replace the nicotine in cigarettes. Medicines may:  Help you to stop having the desire to smoke (cravings).  Help to stop the problems that come when you stop smoking (withdrawal symptoms). Your doctor may ask you to use:  Nicotine patches, gum, or lozenges.  Nicotine inhalers or sprays.  Non-nicotine medicine that is taken by mouth. Find resources Find resources and other ways to help you quit smoking and remain smoke-free after you quit. These resources are most helpful when you use them often. They include:  Online chats with a Social worker.  Phone quitlines.  Printed Furniture conservator/restorer.  Support groups or group counseling.  Text messaging programs.  Mobile phone apps. Use apps on your mobile phone or tablet that can help you stick to your quit plan. There are many free apps for mobile phones and tablets as well as websites. Examples include Quit Guide from the [employer] and smokefree.gov  What  things can I do to make it easier to quit?   Talk to your family and friends. Ask them to support and encourage you.  Call a phone quitline (1-800-QUIT-NOW), reach out to support groups, or work with a Social worker.  Ask people who smoke to not smoke around you.  Avoid places that make you want to smoke, such as: ? Bars. ? Parties. ? Smoke-break areas at work.  Spend time with people who do not smoke.  Lower the stress in your life. Stress can make you want to smoke. Try these things to help your stress: ? Getting regular exercise.  ? Doing deep-breathing exercises. ? Doing yoga. ? Meditating. ? Doing a body scan. To do this, close your eyes, focus on one area of your body at a time from head to toe. Notice which parts of your body are tense. Try to relax the muscles in those areas. How will I feel when I quit smoking? Day 1 to 3 weeks Within the first 24 hours, you may start to have some problems that come from quitting tobacco. These problems are very bad 2-3 days after you quit, but they do not often last for more than 2-3 weeks. You may get these symptoms:  Mood swings.  Feeling restless, nervous, angry, or annoyed.  Trouble concentrating.  Dizziness.  Strong desire for high-sugar foods and nicotine.  Weight gain.  Trouble pooping (constipation).  Feeling like you may vomit (nausea).  Coughing or a sore throat.  Changes in how the medicines that you take for other issues work in your body.  Depression.  Trouble sleeping (insomnia). Week 3 and afterward After the first 2-3 weeks of quitting, you may start to notice more positive results, such as:  Better sense of smell and taste.  Less coughing and sore throat.  Slower heart rate.  Lower blood pressure.  Clearer skin.  Better breathing.  Fewer sick days. Quitting smoking can be hard. Do not give up if you fail the first time. Some people need to try a few times before they succeed. Do your best to stick to your quit plan, and talk with your doctor if you have any questions or concerns. Summary  Smoking tobacco is the leading cause of preventable death. Quitting smoking can be hard, but it is one of the best things that you can do for your health.  When you decide to quit smoking, make a plan to help you succeed.  Quit smoking right away, not slowly over a period of time.  When you start quitting, seek help from your doctor, family, or friends. This information is not intended to replace advice given to you by your health care  provider. Make sure you discuss any questions you have with your health care provider. Document Released: 10/14/2008 Document Revised: 03/07/2018 Document Reviewed: 03/08/2018 Elsevier Patient Education  2020 [employer].

## 2018-12-01 ENCOUNTER — Encounter: Payer: Self-pay | Admitting: Primary Care

## 2018-12-01 DIAGNOSIS — J449 Chronic obstructive pulmonary disease, unspecified: Secondary | ICD-10-CM | POA: Insufficient documentation

## 2018-12-01 DIAGNOSIS — J439 Emphysema, unspecified: Secondary | ICD-10-CM | POA: Insufficient documentation

## 2018-12-01 NOTE — Assessment & Plan Note (Signed)
-   He is still smoking, no interest in quitting at this time - Continue to encourage smoking cessation

## 2018-12-01 NOTE — Assessment & Plan Note (Addendum)
-   Feels breathing is worse, reports increased wheezing and shortness of breath - Stop Anoro; Start Trelegy 1 puff daily - Start Combivent inhaler - 1 puff every 6 hours for sob/wheezing - Continue to encourage that you quit smoking

## 2018-12-01 NOTE — Assessment & Plan Note (Addendum)
-   CT chest 11/18/18 showed stable bilateral pulmonary nodules, moderate to severe emphysematous changes - Plan repeat CT chest wo contrast in 6 months re: pulmonary nodules

## 2018-12-19 ENCOUNTER — Ambulatory Visit (INDEPENDENT_AMBULATORY_CARE_PROVIDER_SITE_OTHER): Payer: Managed Care, Other (non HMO) | Admitting: Family Medicine

## 2018-12-19 ENCOUNTER — Encounter: Payer: Self-pay | Admitting: Family Medicine

## 2018-12-19 ENCOUNTER — Other Ambulatory Visit: Payer: Self-pay

## 2018-12-19 VITALS — BP 144/100 | HR 72 | Ht 65.0 in | Wt 118.5 lb

## 2018-12-19 DIAGNOSIS — I10 Essential (primary) hypertension: Secondary | ICD-10-CM | POA: Diagnosis not present

## 2018-12-19 DIAGNOSIS — I5022 Chronic systolic (congestive) heart failure: Secondary | ICD-10-CM

## 2018-12-19 DIAGNOSIS — I25119 Atherosclerotic heart disease of native coronary artery with unspecified angina pectoris: Secondary | ICD-10-CM | POA: Diagnosis not present

## 2018-12-19 DIAGNOSIS — E785 Hyperlipidemia, unspecified: Secondary | ICD-10-CM

## 2018-12-19 DIAGNOSIS — I714 Abdominal aortic aneurysm, without rupture, unspecified: Secondary | ICD-10-CM
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
## 2018-12-19 NOTE — Patient Instructions (Signed)
Medication Instructions:  Your physician recommends that you continue on your current medications as directed. Please refer to the Current Medication list given to you today.  *If you need a refill on your cardiac medications before your next appointment, please call your pharmacy*  Lab Work: None ordered  If you have labs (blood work) drawn today and your tests are completely normal, you will receive your results only by: Marland Kitchen MyChart Message (if you have MyChart) OR . A paper copy in the mail If you have any lab test that is abnormal or we need to change your treatment, we will call you to review the results.  Testing/Procedures: None ordered   Follow-Up: At [facility], you and your health needs are our priority.  As part of our continuing mission to provide you with exceptional heart care, we have created designated Provider Care Teams.  These Care Teams include your primary Cardiologist (physician) and Advanced Practice Providers (APPs -  Physician Assistants and Nurse Practitioners) who all work together to provide you with the care you need, when you need it.  Your next appointment:   6 month(s)  The format for your next appointment:   In Person  Provider:    You may see Dr. Saunders Revel or one of the following Advanced Practice Providers on your designated Care Team:    Murray Hodgkins, NP  Christell Faith, PA-C  Marrianne Mood, PA-C

## 2019-02-05 ENCOUNTER — Other Ambulatory Visit: Payer: Self-pay | Admitting: Internal Medicine

## 2019-02-20 ENCOUNTER — Other Ambulatory Visit: Payer: Self-pay | Admitting: Internal Medicine

## 2019-05-19 ENCOUNTER — Other Ambulatory Visit: Payer: Self-pay

## 2019-05-19 ENCOUNTER — Ambulatory Visit
Admission: RE | Admit: 2019-05-19 | Discharge: 2019-05-19 | Disposition: A | Discharge status: Home / Self Care | Payer: Managed Care, Other (non HMO) | Source: Ambulatory Visit | Attending: Primary Care | Admitting: Primary Care

## 2019-05-19 DIAGNOSIS — R918 Other nonspecific abnormal finding of lung field: Secondary | ICD-10-CM | POA: Diagnosis not present

## 2019-05-19 DIAGNOSIS — J984 Other disorders of lung: Secondary | ICD-10-CM | POA: Diagnosis not present

## 2019-05-19 NOTE — Progress Notes (Signed)
Please let patient know CT chest showed evidence of New/increased patchy airspace opacities in both lower lobes, suspicious for pneumonia.   Can you please make him a visit with a provider this week to review results and evaluate him

## 2019-05-20 NOTE — Progress Notes (Signed)
Patient identification verified. Results of recent CT reviewed.  Per Derl Barrow, NP, please let patient know CT chest showed evidence of new/increased patchy airspace opacities in both lower lobes, suspicious for pneumonia.   Can you please make him a visit with a provider this week to review results and evaluate him ?  Appointment made with Rexene Edison NP on 03/29/2019

## 2019-05-22 ENCOUNTER — Ambulatory Visit: Payer: Managed Care, Other (non HMO) | Admitting: Adult Health

## 2019-05-25 ENCOUNTER — Encounter: Payer: Self-pay | Admitting: Internal Medicine

## 2019-05-25 ENCOUNTER — Ambulatory Visit (INDEPENDENT_AMBULATORY_CARE_PROVIDER_SITE_OTHER): Payer: Managed Care, Other (non HMO) | Admitting: Internal Medicine

## 2019-05-25 ENCOUNTER — Other Ambulatory Visit: Payer: Self-pay

## 2019-05-25 ENCOUNTER — Ambulatory Visit: Payer: Self-pay

## 2019-05-25 ENCOUNTER — Telehealth: Payer: Managed Care, Other (non HMO) | Admitting: Internal Medicine

## 2019-05-25 VITALS — BP 102/58 | HR 80 | Temp 97.5°F | Ht 65.0 in | Wt 116.0 lb

## 2019-05-25 DIAGNOSIS — J441 Chronic obstructive pulmonary disease with (acute) exacerbation: Secondary | ICD-10-CM

## 2019-05-25 DIAGNOSIS — I716 Thoracoabdominal aortic aneurysm, without rupture, unspecified: Secondary | ICD-10-CM

## 2019-05-25 DIAGNOSIS — J189 Pneumonia, unspecified organism: Secondary | ICD-10-CM | POA: Diagnosis not present

## 2019-05-25 MED ORDER — AZITHROMYCIN 250 MG PO TABS: ORAL_TABLET | ORAL | 0 refills | Status: AC

## 2019-05-25 MED ORDER — BENZONATATE 100 MG PO CAPS: 100.0000 mg | ORAL_CAPSULE | Freq: Three times a day (TID) | ORAL | 0 refills | Status: DC
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
## 2019-05-25 NOTE — Telephone Encounter (Signed)
Pt. Reports he is having cough and shortness of breath. States "they said I have pneumonia on my CT scan, but didn't put me on an antibiotic." Requesting an antibiotic be sent to his pharmacy. Agent made pt. An a virtual visit for this afternoon. Pt. States "I need the antibiotic now." Answer Assessment - Initial Assessment Questions 1. RESPIRATORY STATUS: "Describe your breathing?" (e.g., wheezing, shortness of breath, unable to speak, severe coughing)      Shortness of breath 2. ONSET: "When did this breathing problem begin?"      Started last Sunday 3. PATTERN "Does the difficult breathing come and go, or has it been constant since it started?"      Constant 4. SEVERITY: "How bad is your breathing?" (e.g., mild, moderate, severe)    - MILD: No SOB at rest, mild SOB with walking, speaks normally in sentences, can lay down, no retractions, pulse < 100.    - MODERATE: SOB at rest, SOB with minimal exertion and prefers to sit, cannot lie down flat, speaks in phrases, mild retractions, audible wheezing, pulse 100-120.    - SEVERE: Very SOB at rest, speaks in single words, struggling to breathe, sitting hunched forward, retractions, pulse > 120      Moderate 5. RECURRENT SYMPTOM: "Have you had difficulty breathing before?" If so, ask: "When was the last time?" and "What happened that time?"      Yes - COPD 6. CARDIAC HISTORY: "Do you have any history of heart disease?" (e.g., heart attack, angina, bypass surgery, angioplasty)      Yes 7. LUNG HISTORY: "Do you have any history of lung disease?"  (e.g., pulmonary embolus, asthma, emphysema)     COPD 8. CAUSE: "What do you think is causing the breathing problem?"      Pneumonia 9. OTHER SYMPTOMS: "Do you have any other symptoms? (e.g., dizziness, runny nose, cough, chest pain, fever)     Non-productive 10. PREGNANCY: "Is there any chance you are pregnant?" "When was your last menstrual period?"       n/a 11. TRAVEL: "Have you traveled out of the  country in the last month?" (e.g., travel history, exposures)       No  Protocols used: BREATHING DIFFICULTY-A-AH

## 2019-05-29 ENCOUNTER — Ambulatory Visit (INDEPENDENT_AMBULATORY_CARE_PROVIDER_SITE_OTHER): Payer: Managed Care, Other (non HMO) | Admitting: Adult Health

## 2019-05-29 ENCOUNTER — Encounter: Payer: Self-pay | Admitting: Adult Health

## 2019-05-29 ENCOUNTER — Other Ambulatory Visit: Payer: Self-pay

## 2019-05-29 DIAGNOSIS — Z720 Tobacco use: Secondary | ICD-10-CM

## 2019-05-29 DIAGNOSIS — J189 Pneumonia, unspecified organism: Secondary | ICD-10-CM | POA: Insufficient documentation

## 2019-05-29 DIAGNOSIS — Z87898 Personal history of other specified conditions: Secondary | ICD-10-CM

## 2019-05-29 DIAGNOSIS — J449 Chronic obstructive pulmonary disease, unspecified: Secondary | ICD-10-CM

## 2019-05-29 MED ORDER — PREDNISONE 20 MG PO TABS: 40.0000 mg | ORAL_TABLET | Freq: Every day | ORAL | 0 refills | Status: DC

## 2019-05-29 MED ORDER — ALBUTEROL SULFATE HFA 108 (90 BASE) MCG/ACT IN AERS: 1.0000 | INHALATION_SPRAY | Freq: Four times a day (QID) | RESPIRATORY_TRACT | 1 refills | Status: DC | PRN

## 2019-05-29 MED ORDER — LEVOFLOXACIN 500 MG PO TABS: 500.0000 mg | ORAL_TABLET | Freq: Every day | ORAL | 0 refills | Status: AC

## 2019-05-29 NOTE — Patient Instructions (Addendum)
Please set up for COVID-19 testing Levaquin 500mg  daily for 7 days , take with food.  Prednisone 40mg  daily for 5 days .  Mucinex DM Twice daily  As needed  Cough/congestion  Your oxygen level was okay , no need to start oxygen at this time.  Please check oxygen levels at home and call if <90% or go to ER .  Continue on TRELEGY 1 puff daily  Albuterol inhaler 2 puffs every 4hrs As needed  For wheezing  Mucinex DM Twice daily  As needed cough/congestion  Work on not smoking .  Follow up with Dr. Mortimer Fries in 1-2 weeks with chest xray .  Please contact office for sooner follow up if symptoms do not improve or worsen or seek emergency care

## 2019-05-29 NOTE — Assessment & Plan Note (Signed)
Smoking cessation is encouraged

## 2019-05-29 NOTE — Assessment & Plan Note (Addendum)
Bilateral patchy opacities suspicious for pneumonia on CT chest.  Associated cough congestion and shortness of breath.  Patient needs COVID-19 testing immediately. We will change to more broad-spectrum antibiotic with Levaquin.  Short course of prednisone.  Close follow-up in 1 to 2 weeks.  Pending COVID-19 testing Patient did not have any desaturations on room air or walking.  Plan Patient Instructions  Please set up for COVID-19 testing Levaquin 500mg  daily for 7 days , take with food.  Prednisone 40mg  daily for 5 days .  Mucinex DM Twice daily  As needed  Cough/congestion  Your oxygen level was okay , no need to start oxygen at this time.  Please check oxygen levels at home and call if <90% or go to ER .  Continue on TRELEGY 1 puff daily  Albuterol inhaler 2 puffs every 4hrs As needed  For wheezing  Mucinex DM Twice daily  As needed cough/congestion  Work on not smoking .  Follow up with Dr. Mortimer Fries in 1-2 weeks with chest xray .  Please contact office for sooner follow up if symptoms do not improve or worsen or seek emergency care

## 2019-05-29 NOTE — Progress Notes (Signed)
@Patient  ID: Clinton Walsh, male    DOB: 02-15-1960, 59 y.o.   MRN: [id number]  Chief Complaint  Patient presents with  . Follow-up    COPD     Referring provider: Glean Hess, MD  HPI: 59 year old male active smoker followed for gold D COPD with severe emphysema, pulmonary nodules  TEST/EVENTS :  PET scan on 04/26/16  Interpretation:LUL nodular scarring with SUV 2.7 mediastinal adenopathy  Repeat CT chest on 06/28/16 shows a stable persistent left upper lobe nodule No significant change in size  Repeat CT chest 04/03/2017 Shows stable nodules No increase in size Severe emphysema noted  CT chest 05/2018 1. No acute abnormality detected. 2. Multiple pulmonary nodules are again identified bilaterally, all of which are stable from prior study. No new worrisome pulmonary nodules identified on today's exam. 3. Ectatic ascending thoracic aorta measuring 4.3 cm (previously measuring 4 cm). Recommend annual imaging followup by CTA or MRA  Pulmonary testing: Office spiro shows moderate to severe obstruction ratio48% fev1 2L 63% fef25/75 0.8L 29% ONO+hypoxia  05/29/2019 Follow up  : COPD , O2 RF  Patient presents for a follow-up visit.  Patient complains of ongoing COPD symptoms with cough shortness of breath, cough, congestion , weakness and low oxygen levels.  Patient remains on Trelegy daily.   Patient has known pulmonary nodules is being followed serially by CT chest.  Most recent CT chest on May 19, 2019 showed a new increased patchy airspace opacities in both bilateral lobes.  New irregular 8 mm nodule in the right lower lobe.  Additional small right lower lobe nodule.  Patient was seen by his primary care provider and given a Z-Pak.  Patient says he took the last dose this morning has not seen any improvement in symptoms.  Patient says he has been using a portable concentrator that he got from a friend when he feels short of breath. Today O2 saturation is on  room air 98%, walking O2 saturation is 93% on room air Patient continues to smoke 1 PPD .  Smoking cessation encouraged No recent COVID-19 testing.  Has not received the Covid vaccine.  Patient was very angry during visit today.  Declined putting on mask stating how stupid this was and how stupid doctors were dealing with Covid and what a joke this has been "  Patient education attempted for patient and his family or friend member regarding seriousness of his condition and also COVID-19 precautions   Allergies  Allergen Reactions  . Other Rash  . Sulfa Antibiotics Rash    There is no immunization history for the selected administration types on file for this patient.  Past Medical History:  Diagnosis Date  . CAD (coronary artery disease)    a.  2008 Cath: nonobs dzs, NL LV;  b. 03/2011 NSTEMI/Cath: 100% D1 (2.25x18 MiniVision BMS), otw nonobs dzs, EF 45-50%;  c. 04/14/11 NSTEMI/PCI: D1 100% in setting of noncompliance (thrombectomy & PTCA);  d. 04/2016 Inf STEMI: RCA 6m (3.0x34 Resolute Integrity DES), otw nonobs dzs; e. 07/2016 PCI: LAD 60 (FFR 0.78-->2.5x23 Xience Alpine DES), D1 30ost, 20p ISR, LCX 50p/m, OM2 70, RCA patent stent.  . HFrEF (heart failure with reduced ejection fraction) ([place])    a. 04/2016 Echo: EF 30-35%, diff HK, Gr1 DD, triv AI, mild to mod MR;  b. 07/2016 LV gram: EF 35-40%; c. 11/2017 Echo: EF 40-45%, Gr2 DD, mild AI/MR, mild to mod TR.  Marland Kitchen Hyperlipidemia   . Ischemic cardiomyopathy  a. 04/2016 Echo: EF 30-35%, diff HK, Gr1 DD;  b. 07/2016 LV gram: EF 35-40%; c. 11/2017 Echo: EF 40-45%.  . Mediastinal adenopathy    a. 04/2017 CT chest: stable mediastinal and hilar lymphadenopathy w/ speckled Ca2+. Suspicious for sarcoidosis.  . Polymyalgia rheumatica ([place]) 02/2015  . Pulmonary nodules    a. nonspecific nodules in the right middle lobe and right lower lobe by CT; b. 04/2017 CT Chest: Stable RML/RLL nodules - followed by pulm.  . Thoracic ascending aortic aneurysm ([place])     a. 06/2016 CTA: stable 4 cm TAA rec annual f/u.  Marland Kitchen TIA (transient ischemic attack)    a. 10/2017 possible TIA - transient R sided wkns; b. 11/2017 Carotid U/S: nl.  . Tobacco abuse     Tobacco History: Social History   Tobacco Use  Smoking Status Current Every Day Smoker  . Packs/day: 0.50  . Years: 30.00  . Pack years: 15.00  . Types: E-cigarettes, Cigarettes  . Last attempt to quit: 07/2015  . Years since quitting: 3.9  Smokeless Tobacco Never Used   Ready to quit: No Counseling given: Yes   Outpatient Medications Prior to Visit  Medication Sig Dispense Refill  . aspirin EC 81 MG tablet Take 1 tablet (81 mg total) by mouth daily. 30 tablet 0  . atorvastatin (LIPITOR) 80 MG tablet Take 1 tablet (80 mg total) by mouth daily. 90 tablet 3  . atorvastatin (LIPITOR) 80 MG tablet TAKE 1 TABLET BY MOUTH EVERY DAY 90 tablet 0  . azithromycin (ZITHROMAX Z-PAK) 250 MG tablet UAD 6 each 0  . Fluticasone-Umeclidin-Vilant (TRELEGY ELLIPTA) 100-62.5-25 MCG/INH AEPB Inhale 1 puff into the lungs daily.    . nitroGLYCERIN (NITROSTAT) 0.4 MG SL tablet Place 0.4 mg under the tongue every 5 (five) minutes as needed for chest pain.    . benzonatate (TESSALON PERLES) 100 MG capsule Take 1 capsule (100 mg total) by mouth 3 (three) times daily. (Patient not taking: Reported on 05/29/2019) 30 capsule 0   No facility-administered medications prior to visit.     Review of Systems:   Constitutional:   No  weight loss, night sweats,  Fevers, chills, ++ fatigue, or  lassitude.  HEENT:   No headaches,  Difficulty swallowing,  Tooth/dental problems, or  Sore throat,                No sneezing, itching, ear ache, nasal congestion, post nasal drip,   CV:  No chest pain,  Orthopnea, PND, swelling in lower extremities, anasarca, dizziness, palpitations, syncope.   GI  No heartburn, indigestion, abdominal pain, nausea, vomiting, diarrhea, change in bowel habits, loss of appetite, bloody stools.   Resp:     No chest wall deformity  Skin: no rash or lesions.  GU: no dysuria, change in color of urine, no urgency or frequency.  No flank pain, no hematuria   MS:  No joint pain or swelling.  No decreased range of motion.  No back pain.    Physical Exam  BP 120/60 (BP Location: Left Arm, Cuff Size: Normal)   Pulse 81   Temp (!) 97.1 F (36.2 C) (Oral)   Ht 5\' 5"  (1.651 m)   Wt 115 lb 12.8 oz (52.5 kg)   SpO2 96%   BMI 19.27 kg/m   GEN: A/Ox3; pleasant , NAD, well nourished    HEENT:  [place]/AT,    NOSE-clear, THROAT-clear, no lesions, no postnasal drip or exudate noted.   NECK:  Supple w/ fair  ROM; no JVD; normal carotid impulses w/o bruits; no thyromegaly or nodules palpated; no lymphadenopathy.    RESP  Clear  P & A; w/o, wheezes/ rales/ or rhonchi. no accessory muscle use, no dullness to percussion  CARD:  RRR, no m/r/g, no peripheral edema, pulses intact, no cyanosis or clubbing.  GI:   Soft & nt; nml bowel sounds; no organomegaly or masses detected.   Musco: Warm bil, no deformities or joint swelling noted.   Neuro: alert, no focal deficits noted.    Skin: Warm, no lesions or rashes    Lab Results:  CBC  BMET  BNP No results found for: BNP  ProBNP No results found for: PROBNP  Imaging: CT Chest Wo Contrast  Result Date: 05/19/2019 CLINICAL DATA:  Follow up lung nodules. Current smoker. EXAM: CT CHEST WITHOUT CONTRAST TECHNIQUE: Multidetector CT imaging of the chest was performed following the standard protocol without IV contrast. COMPARISON:  Prior CTs 11/18/2018, 05/22/2018 and 04/03/2017. FINDINGS: Cardiovascular: Atherosclerosis of the aorta, great vessels and coronary arteries with possible coronary artery stents. Mild dilatation of the ascending aorta to 4.3 cm appears similar to the previous study. The heart size is normal. There is no pericardial effusion. Mediastinum/Nodes: There are no enlarged mediastinal, hilar or axillary lymph nodes.There are small  calcified hilar lymph nodes bilaterally. The thyroid gland, trachea and esophagus demonstrate no significant findings. Lungs/Pleura: No pleural effusion or pneumothorax. Again demonstrated are changes of moderate to severe centrilobular and paraseptal emphysema. There is stable chronic partially calcified scarring in the left upper lobe. Subpleural right lower lobe nodule measuring 5 mm on image 137/3 is stable. There are new/increased patchy airspace opacities in both lower lobes, especially anteriorly in the left upper lobe, suspicious for pneumonia. There is diffuse central airway thickening, especially in the lower lobes. There is a new irregular 8 mm nodule in the right lower lobe on image 119/3 which could be inflammatory. Likewise, there is mildly increased density associated with a previously demonstrated cystic air space, measuring 2.0 cm on image 102/3. Upper abdomen: The visualized upper abdomen appears stable without acute findings. The abdominal aorta is dilated to at least 3.9 cm, similar to abdominal CT 07/14/2018. Musculoskeletal/Chest wall: There is no chest wall mass or suspicious osseous finding. Stable chronic superior endplate compression deformity at L1 with ankylosis of the posterior elements at L1-2. IMPRESSION: 1. New/increased patchy airspace opacities in both lower lobes, suspicious for pneumonia. Radiographic follow-up recommended. 2. New irregular 8 mm nodule and increased density associated with a previously demonstrated cystic air space in the right lower lobe which could be inflammatory. Additional small right lower lobe pulmonary nodule is stable. Continued CT follow-up in 6-12 months recommended. 3. Partially calcified scarring in the left upper lobe. 4. Stable dilatation of the ascending thoracic and abdominal aorta. 5. Aortic Atherosclerosis (ICD10-I70.0) and Emphysema (ICD10-J43.9). Electronically Signed   By: Richardean Sale M.D.   On: 05/19/2019 16:50      No flowsheet  data found.  No results found for: NITRICOXIDE      Assessment & Plan:   COPD, group D, by GOLD 2017 classification ([place]) Acute slow to resolve exacerbation with associated bilateral pneumonia.   Patient needs COVID-19 testing.  Plan  Patient Instructions  Levaquin 500mg  daily for 7 days , take with food.  Mucinex DM Twice daily  As needed  Cough/congestion   Follow up with Dr. Mortimer Fries in 2 weeks with chest xray .  Please contact office for sooner follow up  if symptoms do not improve or worsen or seek emergency care       Pneumonia Bilateral patchy opacities suspicious for pneumonia on CT chest.  Associated cough congestion and shortness of breath.  Patient needs COVID-19 testing immediately. We will change to more broad-spectrum antibiotic with Levaquin.  Short course of prednisone.  Close follow-up in 1 to 2 weeks.  Pending COVID-19 testing Patient did not have any desaturations on room air or walking.  Plan Patient Instructions  Please set up for COVID-19 testing Levaquin 500mg  daily for 7 days , take with food.  Prednisone 40mg  daily for 5 days .  Mucinex DM Twice daily  As needed  Cough/congestion  Your oxygen level was okay , no need to start oxygen at this time.  Please check oxygen levels at home and call if <90% or go to ER .  Continue on TRELEGY 1 puff daily  Albuterol inhaler 2 puffs every 4hrs As needed  For wheezing  Mucinex DM Twice daily  As needed cough/congestion  Work on not smoking .  Follow up with Dr. Mortimer Fries in 1-2 weeks with chest xray .  Please contact office for sooner follow up if symptoms do not improve or worsen or seek emergency care       Tobacco abuse Smoking cessation is encouraged  History of multiple pulmonary nodules Continued pulmonary nodules noted with new areas of patchy patient will follow up in 1 to 2 weeks.  We will discuss follow-up CT at that visit.   Total patient care time 51 minutes  Tammy Parrett, NP 05/29/2019

## 2019-05-29 NOTE — Assessment & Plan Note (Signed)
Continued pulmonary nodules noted with new areas of patchy patient will follow up in 1 to 2 weeks.  We will discuss follow-up CT at that visit.

## 2019-05-29 NOTE — Assessment & Plan Note (Signed)
Acute slow to resolve exacerbation with associated bilateral pneumonia.   Patient needs COVID-19 testing.  Plan  Patient Instructions  Levaquin 500mg  daily for 7 days , take with food.  Mucinex DM Twice daily  As needed  Cough/congestion   Follow up with Dr. Mortimer Fries in 2 weeks with chest xray .  Please contact office for sooner follow up if symptoms do not improve or worsen or seek emergency care

## 2019-06-15 ENCOUNTER — Ambulatory Visit (INDEPENDENT_AMBULATORY_CARE_PROVIDER_SITE_OTHER): Payer: Managed Care, Other (non HMO) | Admitting: Primary Care

## 2019-06-15 ENCOUNTER — Other Ambulatory Visit: Payer: Self-pay

## 2019-06-15 ENCOUNTER — Ambulatory Visit
Admission: RE | Admit: 2019-06-15 | Discharge: 2019-06-15 | Disposition: A | Discharge status: Home / Self Care | Payer: Managed Care, Other (non HMO) | Source: Ambulatory Visit | Attending: Primary Care | Admitting: Primary Care

## 2019-06-15 ENCOUNTER — Encounter: Payer: Self-pay | Admitting: Primary Care

## 2019-06-15 VITALS — BP 130/80 | HR 72 | Temp 97.5°F | Ht 66.0 in | Wt 115.4 lb

## 2019-06-15 DIAGNOSIS — Z720 Tobacco use: Secondary | ICD-10-CM

## 2019-06-15 DIAGNOSIS — J449 Chronic obstructive pulmonary disease, unspecified: Secondary | ICD-10-CM | POA: Diagnosis not present

## 2019-06-15 DIAGNOSIS — J441 Chronic obstructive pulmonary disease with (acute) exacerbation: Secondary | ICD-10-CM | POA: Diagnosis not present

## 2019-06-15 DIAGNOSIS — I2510 Atherosclerotic heart disease of native coronary artery without angina pectoris: Secondary | ICD-10-CM | POA: Diagnosis not present

## 2019-06-15 DIAGNOSIS — J189 Pneumonia, unspecified organism: Secondary | ICD-10-CM | POA: Insufficient documentation

## 2019-06-15 DIAGNOSIS — R9389 Abnormal findings on diagnostic imaging of other specified body structures: Secondary | ICD-10-CM

## 2019-06-15 DIAGNOSIS — I729 Aneurysm of unspecified site: Secondary | ICD-10-CM

## 2019-06-15 DIAGNOSIS — J439 Emphysema, unspecified: Secondary | ICD-10-CM | POA: Diagnosis not present

## 2019-06-15 DIAGNOSIS — J9811 Atelectasis: Secondary | ICD-10-CM | POA: Diagnosis not present

## 2019-06-15 DIAGNOSIS — Z87898 Personal history of other specified conditions: Secondary | ICD-10-CM | POA: Diagnosis not present

## 2019-06-15 DIAGNOSIS — J9611 Chronic respiratory failure with hypoxia: Secondary | ICD-10-CM

## 2019-06-15 MED ORDER — OMEPRAZOLE 20 MG PO CPDR: 20.0000 mg | DELAYED_RELEASE_CAPSULE | Freq: Every day | ORAL | 5 refills | Status: DC

## 2019-06-15 MED ORDER — TRELEGY ELLIPTA 100-62.5-25 MCG/INH IN AEPB: 1.0000 | INHALATION_SPRAY | Freq: Every day | RESPIRATORY_TRACT | 2 refills | Status: DC

## 2019-06-15 NOTE — Progress Notes (Signed)
@Patient  ID: Clinton Walsh, male    DOB: 03/25/1960, 59 y.o.   MRN: [id number]  Chief Complaint  Patient presents with  . Follow-up    congestion in chest.  coughing up chunks  white and some brownish white.  Sats droping down into the 70 on RA at home.    Referring provider: Glean Hess, MD  HPI: 59 year old male, current every day smoker/ marijuana use (hx vaping). PMH significant for COPD GOLD D, solitary LUL pulmonary nodule, CAD. Patient of Dr. Mortimer Fries, last seen by him on 05/29/18. CT chest 05/22/18 showed multiple pulmonary nodules bilaterally which are all stable. Plan ongoing CT scan every 6 months, if change in appearance of nodule he will need EBUS bronchoscopy. Anoro changed to Trelegy.   Previous [facility] pulmonary encounters: 11/21/2018 Patient presents today for 6 month follow-up. Repeat CT chest showed stable bilateral pulmonary nodules, moderate to severe emphysematous changes. Feels his breathing is worse. Reports productive cough with thick white mucus. He has associated chest tightness and shortness of breath with activity. Taking Anoro as prescribed. Not wearing oxygen at night anymore, returned it. He is still smoking, no interest in quitting at this time. CAT score 27.   05/29/19- Tammy Parrett, NP Ongoing COPD symptoms with cough, shortness of breath, weakness and low oxygen levels. Remains on Trelegy. CT chest May 18th 2021 showed increased patchy airspace opacities in both bilateral lobes. O2 saturation in office 98% with walking O2 93% on room air. Continues to smoke 1ppd. Has not received covid vaccine. Rx Levaquin 500mg  x 1 week. Mucinex DM twice daily. Needs repeat CXR in 2 weeks.   06/15/2019 Patient presents today for 2 week follow-up visit. Needs repeat CXR today. Completed course of oral levaquin and prednisone. He did not get tested for COVI. He reports feeling better. He has a productive cough, sputum is mostly white. Reports his mucus has cleared up. He  still feels congested. His breathing is about the same, states that it is still hard to breath some days. He is still smoking 1/2 ppd. He quit vaping a year. He smokes marijuana once a day. He wears 6L oxygen mostly on exertion or when its hot/humid outside. States that he is not on it all the time, only when he can not catch his breath. He is compliant with Trelegy one puff daily in the morning. He rinses he mouth after use. He has only used Albuterol hfa once in the last two weeks. He has a nebulizer but states that it doesn't work. He has left sided chest/ axillary pain every day. This is not new and was documented in cardiology's last note (he was seen in December 2020). He has not needed to take nitroglycerin. He has a history of gastritis, not currently taking PPI.    Imaging: PET scan on 04/26/16 - LUL nodular scarring with SUV 2.7 mediastinal adenopathy  Repeat CT chest on 06/28/16 shows a stable persistent left upper lobe nodule. No significant change in size  Repeat CT chest 04/03/2017- Shows stable nodules, no increase in size Severe emphysema noted  CT chest 05/2018- No acute abnormality detected. Multiple pulmonary nodules are again identified bilaterally, all of which are stable from prior study. No new worrisome pulmonary nodules identified on today's exam. Ectatic ascending thoracic aorta measuring 4.3 cm (previously measuring 4 cm). Recommend annual imaging followup by CTA or MRA  Pulmonary testing: Office spirometry showed moderate to severe obstruction Ratio48%; FEV1 2L 63%; fef25/75 0.8L 29% ONO+hypoxia  Allergies  Allergen Reactions  . Other Rash  . Sulfa Antibiotics Rash    There is no immunization history for the selected administration types on file for this patient.  Past Medical History:  Diagnosis Date  . CAD (coronary artery disease)    a.  2008 Cath: nonobs dzs, NL LV;  b. 03/2011 NSTEMI/Cath: 100% D1 (2.25x18 MiniVision BMS), otw nonobs dzs, EF 45-50%;  c.  04/14/11 NSTEMI/PCI: D1 100% in setting of noncompliance (thrombectomy & PTCA);  d. 04/2016 Inf STEMI: RCA 86m (3.0x34 Resolute Integrity DES), otw nonobs dzs; e. 07/2016 PCI: LAD 60 (FFR 0.78-->2.5x23 Xience Alpine DES), D1 30ost, 20p ISR, LCX 50p/m, OM2 70, RCA patent stent.  . HFrEF (heart failure with reduced ejection fraction) ([place])    a. 04/2016 Echo: EF 30-35%, diff HK, Gr1 DD, triv AI, mild to mod MR;  b. 07/2016 LV gram: EF 35-40%; c. 11/2017 Echo: EF 40-45%, Gr2 DD, mild AI/MR, mild to mod TR.  Marland Kitchen Hyperlipidemia   . Ischemic cardiomyopathy    a. 04/2016 Echo: EF 30-35%, diff HK, Gr1 DD;  b. 07/2016 LV gram: EF 35-40%; c. 11/2017 Echo: EF 40-45%.  . Mediastinal adenopathy    a. 04/2017 CT chest: stable mediastinal and hilar lymphadenopathy w/ speckled Ca2+. Suspicious for sarcoidosis.  . Polymyalgia rheumatica ([place]) 02/2015  . Pulmonary nodules    a. nonspecific nodules in the right middle lobe and right lower lobe by CT; b. 04/2017 CT Chest: Stable RML/RLL nodules - followed by pulm.  . Thoracic ascending aortic aneurysm ([place])    a. 06/2016 CTA: stable 4 cm TAA rec annual f/u.  Marland Kitchen TIA (transient ischemic attack)    a. 10/2017 possible TIA - transient R sided wkns; b. 11/2017 Carotid U/S: nl.  . Tobacco abuse     Tobacco History: Social History   Tobacco Use  Smoking Status Current Every Day Smoker  . Packs/day: 0.50  . Years: 30.00  . Pack years: 15.00  . Types: E-cigarettes, Cigarettes  . Last attempt to quit: 07/2015  . Years since quitting: 3.9  Smokeless Tobacco Never Used   Ready to quit: Not Answered Counseling given: Not Answered   Outpatient Medications Prior to Visit  Medication Sig Dispense Refill  . albuterol (VENTOLIN HFA) 108 (90 Base) MCG/ACT inhaler Inhale 1-2 puffs into the lungs every 6 (six) hours as needed for wheezing or shortness of breath. 1 g 1  . aspirin EC 81 MG tablet Take 1 tablet (81 mg total) by mouth daily. 30 tablet 0  . benzonatate (TESSALON  PERLES) 100 MG capsule Take 1 capsule (100 mg total) by mouth 3 (three) times daily. 30 capsule 0  . nitroGLYCERIN (NITROSTAT) 0.4 MG SL tablet Place 0.4 mg under the tongue every 5 (five) minutes as needed for chest pain.    Marland Kitchen Fluticasone-Umeclidin-Vilant (TRELEGY ELLIPTA) 100-62.5-25 MCG/INH AEPB Inhale 1 puff into the lungs daily.    Marland Kitchen atorvastatin (LIPITOR) 80 MG tablet Take 1 tablet (80 mg total) by mouth daily. (Patient not taking: Reported on 06/15/2019) 90 tablet 3  . atorvastatin (LIPITOR) 80 MG tablet TAKE 1 TABLET BY MOUTH EVERY DAY 90 tablet 0  . predniSONE (DELTASONE) 20 MG tablet Take 2 tablets (40 mg total) by mouth daily with breakfast. (Patient not taking: Reported on 06/15/2019) 10 tablet 0   No facility-administered medications prior to visit.   Review of Systems  Review of Systems  Respiratory: Positive for cough and shortness of breath.   Cardiovascular: Positive for chest  pain.   Physical Exam  BP 130/80 (BP Location: Right Arm, Cuff Size: Normal)   Pulse 72   Temp (!) 97.5 F (36.4 C) (Oral)   Ht 5\' 6"  (1.676 m)   Wt 115 lb 6.4 oz (52.3 kg)   SpO2 98%   BMI 18.63 kg/m  Physical Exam Constitutional:      Appearance: Normal appearance.  Cardiovascular:     Rate and Rhythm: Normal rate and regular rhythm.  Pulmonary:     Effort: Pulmonary effort is normal.     Breath sounds: Normal breath sounds.     Comments: LS diminished, faint rhonchi left mid-lobe Neurological:     Mental Status: He is alert.      Lab Results:  CBC    Component Value Date/Time   WBC 4.3 11/19/2018 1218   WBC 3.4 (L) 07/04/2016 0502   RBC 5.15 11/19/2018 1218   RBC 4.19 (L) 07/04/2016 0502   HGB 16.6 11/19/2018 1218   HCT 46.6 11/19/2018 1218   PLT 299 11/19/2018 1218   MCV 91 11/19/2018 1218   MCV 91 09/19/2011 0243   MCH 32.2 11/19/2018 1218   MCH 30.2 07/04/2016 0502   MCHC 35.6 11/19/2018 1218   MCHC 34.2 07/04/2016 0502   RDW 13.2 11/19/2018 1218   RDW 13.4  09/19/2011 0243   LYMPHSABS 1.0 06/25/2018 1410   LYMPHSABS 1.5 09/19/2011 0243   MONOABS 0.4 06/27/2016 1631   MONOABS 0.6 09/19/2011 0243   EOSABS 0.1 06/25/2018 1410   EOSABS 0.2 09/19/2011 0243   BASOSABS 0.0 06/25/2018 1410   BASOSABS 0.0 09/19/2011 0243    BMET    Component Value Date/Time   NA 139 11/19/2018 1218   NA 142 09/19/2011 0243   K 5.2 11/19/2018 1218   K 3.9 09/19/2011 0243   CL 103 11/19/2018 1218   CL 108 (H) 09/19/2011 0243   CO2 22 11/19/2018 1218   CO2 26 09/19/2011 0243   GLUCOSE 89 11/19/2018 1218   GLUCOSE 92 07/04/2016 0502   GLUCOSE 92 09/19/2011 0243   BUN 13 11/19/2018 1218   BUN 14 09/19/2011 0243   CREATININE 1.08 11/19/2018 1218   CREATININE 0.97 09/19/2011 0243   CALCIUM 9.5 11/19/2018 1218   CALCIUM 8.4 (L) 09/19/2011 0243   GFRNONAA 76 11/19/2018 1218   GFRNONAA >60 09/19/2011 0243   GFRAA 88 11/19/2018 1218   GFRAA >60 09/19/2011 0243    BNP No results found for: BNP  ProBNP No results found for: PROBNP  Imaging: DG Chest 2 View  Result Date: 06/15/2019 CLINICAL DATA:  Previous opacity left upper lobe. History of coronary artery disease EXAM: CHEST - 2 VIEW COMPARISON:  April 10, 2016; chest CT May 19, 2019 FINDINGS: Underlying emphysematous change again noted. Scarring left upper lobe, stable. No edema or airspace opacity. There is interstitial thickening throughout the mid and lower lung zone regions, in part due to redistribution of blood flow to viable segments of lung and in part due to chronic scarring. Ill-defined opacity in the left lower lung region appears due to atelectasis and scarring based on the CT appearance. No new opacity evident. Heart size is normal. Pulmonary vascularity reflects underlying emphysema. No adenopathy. No bone lesions. IMPRESSION: Underlying emphysematous change with areas of scarring on the left. Interstitial thickening in multiple areas is likely due to a combination of chronic inflammation and  redistribution of blood flow to viable segments. No frank edema or airspace opacity. Cardiac silhouette normal. Pulmonary vascularity reflects underlying  emphysema. Emphysema (ICD10-J43.9). Electronically Signed   By: Lowella Grip III M.D.   On: 06/15/2019 11:33   CT Chest Wo Contrast  Result Date: 05/19/2019 CLINICAL DATA:  Follow up lung nodules. Current smoker. EXAM: CT CHEST WITHOUT CONTRAST TECHNIQUE: Multidetector CT imaging of the chest was performed following the standard protocol without IV contrast. COMPARISON:  Prior CTs 11/18/2018, 05/22/2018 and 04/03/2017. FINDINGS: Cardiovascular: Atherosclerosis of the aorta, great vessels and coronary arteries with possible coronary artery stents. Mild dilatation of the ascending aorta to 4.3 cm appears similar to the previous study. The heart size is normal. There is no pericardial effusion. Mediastinum/Nodes: There are no enlarged mediastinal, hilar or axillary lymph nodes.There are small calcified hilar lymph nodes bilaterally. The thyroid gland, trachea and esophagus demonstrate no significant findings. Lungs/Pleura: No pleural effusion or pneumothorax. Again demonstrated are changes of moderate to severe centrilobular and paraseptal emphysema. There is stable chronic partially calcified scarring in the left upper lobe. Subpleural right lower lobe nodule measuring 5 mm on image 137/3 is stable. There are new/increased patchy airspace opacities in both lower lobes, especially anteriorly in the left upper lobe, suspicious for pneumonia. There is diffuse central airway thickening, especially in the lower lobes. There is a new irregular 8 mm nodule in the right lower lobe on image 119/3 which could be inflammatory. Likewise, there is mildly increased density associated with a previously demonstrated cystic air space, measuring 2.0 cm on image 102/3. Upper abdomen: The visualized upper abdomen appears stable without acute findings. The abdominal aorta is  dilated to at least 3.9 cm, similar to abdominal CT 07/14/2018. Musculoskeletal/Chest wall: There is no chest wall mass or suspicious osseous finding. Stable chronic superior endplate compression deformity at L1 with ankylosis of the posterior elements at L1-2. IMPRESSION: 1. New/increased patchy airspace opacities in both lower lobes, suspicious for pneumonia. Radiographic follow-up recommended. 2. New irregular 8 mm nodule and increased density associated with a previously demonstrated cystic air space in the right lower lobe which could be inflammatory. Additional small right lower lobe pulmonary nodule is stable. Continued CT follow-up in 6-12 months recommended. 3. Partially calcified scarring in the left upper lobe. 4. Stable dilatation of the ascending thoracic and abdominal aorta. 5. Aortic Atherosclerosis (ICD10-I70.0) and Emphysema (ICD10-J43.9). Electronically Signed   By: Richardean Sale M.D.   On: 05/19/2019 16:50     Assessment & Plan:   Pneumonia - Clinically improved after completing course of oral Levaquin x 1 week - Routine CT chest on 05/19/19 showed new/increased patchy airspace opacities in both lower lobes,suspicious for pneumonia.  - Repeat CXR today showed no residual opacities, scarring left base and emphysema   COPD, group D, by GOLD 2017 classification ([place]) - GOLD D, increased symptom burden but continues to smoke  - Continue Trelegy 1 puff daily (rinse mouth) - Take mucinex 600mg  twice daily - Provided with flutter valve today, encouraged that he use this twice daily to help with congestion - Use albuterol rescue inhaler 2 puffs every 4-6 hours for shortness of breath or wheezing  Abnormal CT of the chest - CT chest in May showed new irregular 8 mm nodule and increased density in the right lower lobe which could be inflammatory. Additional small right lower lobe pulmonary nodule is stable. Partially calcified scarring in the left upper lobe. - Needs repeat CTA w/wo  contrast in 5-6 months to follow-up on lung nodule and aortic aneurysm    Chronic respiratory failure with hypoxia (HCC) - Ambulatory walk on  05/29/19 showed O2 93% on room air  - Continue supplemental oxygen to keep O2 goal O2 88-92%   Tobacco abuse - Smoking 1/2 ppd with daily marijuana use. Hx vaping, quit 1-2 yeas ago - Continue to encourage complete smoking cessation, patient is not ready to quit   FU in 3-6 months with Dr. Mortimer Fries only  Martyn Ehrich, NP 06/15/2019

## 2019-06-15 NOTE — Progress Notes (Signed)
Please let patient know CXR showed underlying emphysema, areas of scarring on left. NO frank edema or airspace opacity. No additional abx needed.   Follow up CTA chest as ordered in several months

## 2019-06-15 NOTE — Assessment & Plan Note (Signed)
-   Ambulatory walk on 05/29/19 showed O2 93% on room air  - Continue supplemental oxygen to keep O2 goal O2 88-92%

## 2019-06-15 NOTE — Patient Instructions (Addendum)
Recommendations: - Take mucinex twice daily - Use flutter valve twice daily - Continue Trelegy 1 puff daily (rinse mouth) - Use albuterol rescue inhaler 2 puffs every 4-6 hours for shortness of breath or wheezing - Recommend you resume omeprazole for GERD/reflux/chest pain   Orders: CXR re: PNA  CTA w/wo contrast in 5 months re: pulmonary nodules/ascending aortic aneurysm   Schedule an appointment with cardiology, due to be seen for 6 month follow up this month   Follow-up: 3-6 months with Dr. Mortimer Fries only

## 2019-06-15 NOTE — Assessment & Plan Note (Addendum)
-   Clinically improved after completing course of oral Levaquin x 1 week - Routine CT chest on 05/19/19 showed new/increased patchy airspace opacities in both lower lobes,suspicious for pneumonia.  - Repeat CXR today showed no residual opacities, scarring left base and emphysema

## 2019-06-15 NOTE — Assessment & Plan Note (Signed)
-   CT chest in May showed new irregular 8 mm nodule and increased density in the right lower lobe which could be inflammatory. Additional small right lower lobe pulmonary nodule is stable. Partially calcified scarring in the left upper lobe. - Needs repeat CTA w/wo contrast in 5-6 months to follow-up on lung nodule and aortic aneurysm

## 2019-06-15 NOTE — Assessment & Plan Note (Addendum)
-   GOLD D, increased symptom burden but continues to smoke  - Continue Trelegy 1 puff daily (rinse mouth) - Take mucinex 600mg  twice daily - Provided with flutter valve today, encouraged that he use this twice daily to help with congestion - Use albuterol rescue inhaler 2 puffs every 4-6 hours for shortness of breath or wheezing

## 2019-06-15 NOTE — Assessment & Plan Note (Signed)
-   Smoking 1/2 ppd with daily marijuana use. Hx vaping, quit 1-2 yeas ago - Continue to encourage complete smoking cessation, patient is not ready to quit

## 2019-06-16 ENCOUNTER — Telehealth: Payer: Self-pay | Admitting: Internal Medicine

## 2019-06-16 NOTE — Telephone Encounter (Signed)
Martyn Ehrich, NP  06/15/2019 1:23 PM EDT     Please let patient know CXR showed underlying emphysema, areas of scarring on left. NO frank edema or airspace opacity. No additional abx needed.   Follow up CTA chest as ordered in several months   Pt is aware of results and voiced his understanding.  Nothing further is needed.

## 2019-06-22 ENCOUNTER — Encounter (INDEPENDENT_AMBULATORY_CARE_PROVIDER_SITE_OTHER): Payer: Self-pay | Admitting: Vascular Surgery

## 2019-06-22 ENCOUNTER — Ambulatory Visit (INDEPENDENT_AMBULATORY_CARE_PROVIDER_SITE_OTHER): Payer: Managed Care, Other (non HMO) | Admitting: Vascular Surgery

## 2019-06-22 ENCOUNTER — Other Ambulatory Visit: Payer: Self-pay

## 2019-06-22 VITALS — BP 138/82 | HR 79 | Resp 16 | Wt 115.4 lb

## 2019-06-22 DIAGNOSIS — I25119 Atherosclerotic heart disease of native coronary artery with unspecified angina pectoris: Secondary | ICD-10-CM

## 2019-06-22 DIAGNOSIS — J9611 Chronic respiratory failure with hypoxia: Secondary | ICD-10-CM

## 2019-06-22 DIAGNOSIS — I714 Abdominal aortic aneurysm, without rupture, unspecified: Secondary | ICD-10-CM

## 2019-06-22 DIAGNOSIS — K219 Gastro-esophageal reflux disease without esophagitis: Secondary | ICD-10-CM

## 2019-06-22 DIAGNOSIS — I716 Thoracoabdominal aortic aneurysm, without rupture, unspecified: Secondary | ICD-10-CM

## 2019-06-22 DIAGNOSIS — E785 Hyperlipidemia, unspecified: Secondary | ICD-10-CM | POA: Diagnosis not present
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
## 2019-07-23 ENCOUNTER — Other Ambulatory Visit (INDEPENDENT_AMBULATORY_CARE_PROVIDER_SITE_OTHER): Payer: Managed Care, Other (non HMO)

## 2019-07-23 ENCOUNTER — Ambulatory Visit (INDEPENDENT_AMBULATORY_CARE_PROVIDER_SITE_OTHER): Payer: Managed Care, Other (non HMO) | Admitting: Vascular Surgery

## 2019-07-24 ENCOUNTER — Other Ambulatory Visit: Payer: Self-pay | Admitting: Family Medicine

## 2019-08-27 ENCOUNTER — Other Ambulatory Visit: Payer: Self-pay

## 2019-08-27 MED ORDER — ATORVASTATIN CALCIUM 80 MG PO TABS: 80.0000 mg | ORAL_TABLET | Freq: Every day | ORAL | 0 refills | Status: DC

## 2019-09-14 DIAGNOSIS — Z8673 Personal history of transient ischemic attack (TIA), and cerebral infarction without residual deficits: Secondary | ICD-10-CM | POA: Diagnosis not present

## 2019-09-14 DIAGNOSIS — I2510 Atherosclerotic heart disease of native coronary artery without angina pectoris: Secondary | ICD-10-CM | POA: Diagnosis not present

## 2019-09-14 DIAGNOSIS — F17210 Nicotine dependence, cigarettes, uncomplicated: Secondary | ICD-10-CM | POA: Diagnosis not present

## 2019-09-14 DIAGNOSIS — Z882 Allergy status to sulfonamides status: Secondary | ICD-10-CM | POA: Diagnosis not present

## 2019-09-14 DIAGNOSIS — Z7982 Long term (current) use of aspirin: Secondary | ICD-10-CM | POA: Diagnosis not present

## 2019-09-14 DIAGNOSIS — Z955 Presence of coronary angioplasty implant and graft: Secondary | ICD-10-CM | POA: Diagnosis not present

## 2019-09-14 DIAGNOSIS — S99912A Unspecified injury of left ankle, initial encounter: Secondary | ICD-10-CM | POA: Diagnosis not present

## 2019-09-14 DIAGNOSIS — S92345A Nondisplaced fracture of fourth metatarsal bone, left foot, initial encounter for closed fracture: Secondary | ICD-10-CM | POA: Diagnosis not present

## 2019-09-14 DIAGNOSIS — S99922A Unspecified injury of left foot, initial encounter: Secondary | ICD-10-CM | POA: Diagnosis not present

## 2019-09-14 DIAGNOSIS — I493 Ventricular premature depolarization: Secondary | ICD-10-CM | POA: Diagnosis not present

## 2019-09-14 DIAGNOSIS — Z7952 Long term (current) use of systemic steroids: Secondary | ICD-10-CM | POA: Diagnosis not present

## 2019-09-14 DIAGNOSIS — J449 Chronic obstructive pulmonary disease, unspecified: Secondary | ICD-10-CM | POA: Diagnosis not present

## 2019-09-14 DIAGNOSIS — M79672 Pain in left foot: Secondary | ICD-10-CM | POA: Diagnosis not present

## 2019-09-14 DIAGNOSIS — M353 Polymyalgia rheumatica: Secondary | ICD-10-CM | POA: Diagnosis not present

## 2019-09-16 ENCOUNTER — Ambulatory Visit (INDEPENDENT_AMBULATORY_CARE_PROVIDER_SITE_OTHER): Payer: Managed Care, Other (non HMO) | Admitting: Internal Medicine

## 2019-09-16 ENCOUNTER — Encounter: Payer: Self-pay | Admitting: Internal Medicine

## 2019-09-16 ENCOUNTER — Other Ambulatory Visit: Payer: Self-pay

## 2019-09-16 VITALS — BP 140/82 | HR 78 | Ht 65.0 in | Wt 119.0 lb

## 2019-09-16 DIAGNOSIS — I255 Ischemic cardiomyopathy: Secondary | ICD-10-CM | POA: Diagnosis not present

## 2019-09-16 DIAGNOSIS — I493 Ventricular premature depolarization: Secondary | ICD-10-CM | POA: Diagnosis not present

## 2019-09-16 DIAGNOSIS — I10 Essential (primary) hypertension: Secondary | ICD-10-CM | POA: Diagnosis not present

## 2019-09-16 DIAGNOSIS — R0602 Shortness of breath: Secondary | ICD-10-CM | POA: Diagnosis not present

## 2019-09-16 DIAGNOSIS — I2510 Atherosclerotic heart disease of native coronary artery without angina pectoris: Secondary | ICD-10-CM

## 2019-09-16 DIAGNOSIS — I712 Thoracic aortic aneurysm, without rupture, unspecified: Secondary | ICD-10-CM

## 2019-09-16 DIAGNOSIS — R911 Solitary pulmonary nodule: Secondary | ICD-10-CM

## 2019-09-16 DIAGNOSIS — E785 Hyperlipidemia, unspecified: Secondary | ICD-10-CM | POA: Diagnosis not present

## 2019-09-16 MED ORDER — ASPIRIN EC 81 MG PO TBEC: 81.0000 mg | DELAYED_RELEASE_TABLET | Freq: Every day | ORAL | 3 refills | Status: DC

## 2019-09-16 NOTE — Progress Notes (Addendum)
Follow-up Outpatient Visit Date: 09/16/2019  Primary Care Provider: Glean Hess, [address] [place] [address]  Chief Complaint: Follow-up CAD  HPI:  Clinton Walsh is a 59 y.o. male with history of coronary artery disease status post PCI to diagonal in [id number] complicated by acute stent thrombosis,high risk NSTEMI with subtotal thrombotic occlusion of the proximal/mid RCA in 04/2016, and FFR-guided PCI to mid LAD due to recurrent angina in 07/2016, ischemic cardiomyopathy, TAA (4.3 cm by CT in 05/2019), lung massfollowed by pulmonology, and prior tobacco use, who presents for follow-up of coronary artery disease. He was last seen in our office in 12/2018 by Levell July, NP. At that time, he continued to complain of intermittent chest pain and shortness of breath. This did not seem to improve much with addition of metoprolol at our prior visit in November; he wind up stopping this medicine due to fatigue. He refused isosorbide mononitrate.  Today, Clinton Walsh reports that he has been feeling well.  His chronic exertional dyspnea is mild and stable.  He denies chest pain, palpitations, lightheadedness, edema, and claudication.  He is tolerating his current medications well.  He fractured bones in his left foot earlier this month after losing his balance while dismounting from his hoverboard.  He is wearing a rigid boot at this time.  --------------------------------------------------------------------------------------------------  Cardiovascular History & Procedures: Cardiovascular Problems:  Coronary artery disease  Ischemic cardiomyopathy  Risk Factors:  Known coronary artery disease, male gender, and history of tobacco use  Cath/PCI:  LHC/PCI (07/03/16): LMCA normal. LAD with 30% proximal and 60% mid stenosis. LCx with 50% mid stenosis and OM 2 with 70% stenosis. RCA with 30% ostial disease and widely patent proximal/mid stent. FFR of mid LAD was  0.78. Mid LAD successfully treated with Xience Alpine 2.5 x 23 mm drug-eluting stent. LVEF 35 to 40% with diffusemid and apical hypokinesis.  LHC/PCI (04/10/16): LMCA normal. LAD with 30% mid vessel disease. D1 with 30% ostial stenosis and patent proximal stent with 20% in-stent restenosis. LCx with 50% proximal/mid disease as well as 40% proximal OM 2 stenosis. Large, dominant RCA with 30% ostial and 90% proximal/mid stenosis with intraluminal thrombus. Successful IVUS-guided PCI to the proximal/mid RCA with placement of a Resolute Integrity 3.0 x 34 mm drug-eluting stent. LVEDP 13 mmHg  CV Surgery:  None  EP Procedures and Devices:  None  Non-Invasive Evaluation(s):  Carotid Doppler (11/06/17): No significant stenosis in either carotid artery.  Antegrade vertebral artery flow bilaterally.  Normal subclavian artery flow.  TTE (11/06/17): Normal LV size with LVEF 40-45% and lateral/inferolateral hypokinesis.  Grade 2 diastolic dysfunction.  Mild aortic and mitral regurgitation.  Mild to moderate tricuspid regurgitation.  TTE (04/10/16): Normal LV size with LVEF of 30-35% and hypokinesis of the inferior and inferolateral myocardium. Grade 1 diastolic dysfunction. Trivial aortic regurgitation. Mild to moderate mitral regurgitation. Mildly reduced right ventricular contraction. Normal pulmonary artery pressure.  Recent CV Pertinent Labs: Lab Results  Component Value Date   CHOL 154 11/19/2018   CHOL 192 04/14/2011   HDL 36 (L) 11/19/2018   HDL 31 (L) 04/14/2011   LDLCALC 104 (H) 11/19/2018   LDLCALC 134 (H) 04/14/2011   TRIG 69 11/19/2018   TRIG 137 04/14/2011   CHOLHDL 4.3 11/19/2018   CHOLHDL 5.4 04/11/2016   INR 1.00 06/27/2016   K 5.2 11/19/2018   K 3.9 09/19/2011   MG 1.8 04/15/2011   BUN 13 11/19/2018   BUN 14 09/19/2011  CREATININE 1.08 11/19/2018   CREATININE 0.97 09/19/2011    Past medical and surgical history were reviewed and updated in EPIC.  Current Meds    Medication Sig  . albuterol (VENTOLIN HFA) 108 (90 Base) MCG/ACT inhaler Inhale 1-2 puffs into the lungs every 6 (six) hours as needed for wheezing or shortness of breath.  Marland Kitchen aspirin 325 MG tablet Take 325 mg by mouth daily.  Marland Kitchen atorvastatin (LIPITOR) 80 MG tablet Take 1 tablet (80 mg total) by mouth daily.  . Fluticasone-Umeclidin-Vilant (TRELEGY ELLIPTA) 100-62.5-25 MCG/INH AEPB Inhale 1 puff into the lungs daily.  Marland Kitchen HYDROcodone-acetaminophen (NORCO/VICODIN) 5-325 MG tablet Take by mouth.  . nitroGLYCERIN (NITROSTAT) 0.4 MG SL tablet Place 0.4 mg under the tongue every 5 (five) minutes as needed for chest pain.  Marland Kitchen omeprazole (PRILOSEC) 20 MG capsule Take 1 capsule (20 mg total) by mouth daily.  Marland Kitchen oxyCODONE-acetaminophen (PERCOCET/ROXICET) 5-325 MG tablet Take by mouth.    Allergies: Other and Sulfa antibiotics  Social History   Tobacco Use  . Smoking status: Current Every Day Smoker    Packs/day: 0.50    Years: 30.00    Pack years: 15.00    Types: E-cigarettes, Cigarettes    Last attempt to quit: 07/2015    Years since quitting: 4.2  . Smokeless tobacco: Never Used  Vaping Use  . Vaping Use: Former  . Start date: 07/03/2016  Substance Use Topics  . Alcohol use: No    Alcohol/week: 0.0 standard drinks    Comment: none x 10y  . Drug use: No    Family History  Problem Relation Age of Onset  . Other Father        "Heart problems" pacemaker  . Prostate cancer Father   . Heart attack Father   . Other Mother        "heart problems"  . Heart attack Mother   . Other Maternal Grandfather        "heart exploded" in his 18s    Review of Systems: A 12-system review of systems was performed and was negative except as noted in the HPI.  --------------------------------------------------------------------------------------------------  Physical Exam: BP 140/82 (BP Location: Right Arm, Patient Position: Sitting, Cuff Size: Normal)   Pulse 78   Ht 5\' 5"  (1.651 m)   Wt 119 lb  (54 kg)   BMI 19.80 kg/m   General: NAD. Neck: No JVD or HJR. Lungs: Mildly diminished throughout with coarse breath sounds.  No wheezes or crackles. Heart: RRR w/o murmurs, rubs, or gallops. Abd: Soft, NT/ND. Ext: No LE edema.  EKG:  NSR with frequent PVC's, LAFB, and borderline LVH.  PVC's are new from prior tracing on 12/19/2018.  Otherwise, no significant change.  Lab Results  Component Value Date   WBC 4.3 11/19/2018   HGB 16.6 11/19/2018   HCT 46.6 11/19/2018   MCV 91 11/19/2018   PLT 299 11/19/2018    Lab Results  Component Value Date   NA 139 11/19/2018   K 5.2 11/19/2018   CL 103 11/19/2018   CO2 22 11/19/2018   BUN 13 11/19/2018   CREATININE 1.08 11/19/2018   GLUCOSE 89 11/19/2018   ALT 10 11/19/2018    Lab Results  Component Value Date   CHOL 154 11/19/2018   HDL 36 (L) 11/19/2018   LDLCALC 104 (H) 11/19/2018   TRIG 69 11/19/2018   CHOLHDL 4.3 11/19/2018    --------------------------------------------------------------------------------------------------  ASSESSMENT AND PLAN: Coronary artery disease: No chest pain reported; DOE that is likely  multifactorial is stable.  We will continue his current medications for secondary prevention, including aspirin and atorvastatin, though aspirin will be decreased from 325 mg daily to 81 mg daily.  I will check a CBC and CMP today.  Ischemic cardiomyopathy: Most recent echo in 11/2017 showed moderately reduced LVEF and mild valvular regurgitation.  Clinton Walsh has been intolerant of and/or reluctant to take beta blockers and ACEI/ARB's in the past.  We have agreed to repeat an echocardiogram at his convenience to reassess his LVEF.  If the LVEF has not improved, we will need to be more aggressive about adding GDMT.  I will check a CMP today.  Thoracic aortic aneurysm: Stable on most recent chest CT in 05/2019.  Continue current medications and annual surveillance.  PVC's: Frequent PVC's noted on EKG today  (asymptomatic).  We will check labs today as well as an echocardiogram.  Hypertension: Blood pressure is borderline elevated today.  We discussed adding a beta-blocker and ACEI/ARB but have agreed to defer this pending the aforementioned echocardiogram.  In the meantime, I have encouraged Clinton Walsh to minimize his sodium intake.  Hyperlipidemia: Continue atorvastatin 80 mg daily.  We will check a lipid panel and CMP today.  Pulmonary nodule: Continue surveillance per pulmonary.  Follow-up: Return to clinic in 6 months.  [name], MD 09/16/2019 2:06 PM

## 2019-09-16 NOTE — Patient Instructions (Signed)
Medication Instructions:  Your physician has recommended you make the following change in your medication:  1- DECREASE Aspirin to 81 mg by mouth once a day.  *If you need a refill on your cardiac medications before your next appointment, please call your pharmacy*   Lab Work: Your physician recommends that you return for lab work in: TODAY - CBC, CMET, LIPID, Mg.  If you have labs (blood work) drawn today and your tests are completely normal, you will receive your results only by: Marland Kitchen MyChart Message (if you have MyChart) OR . A paper copy in the mail If you have any lab test that is abnormal or we need to change your treatment, we will call you to review the results.   Testing/Procedures: Your physician has requested that you have an echocardiogram. Echocardiography is a painless test that uses sound waves to create images of your heart. It provides your doctor with information about the size and shape of your heart and how well your heart's chambers and valves are working. This procedure takes approximately one hour. There are no restrictions for this procedure. You may get an IV, if needed, to receive an ultrasound enhancing agent through to better visualize your heart.   Follow-Up: At [facility], you and your health needs are our priority.  As part of our continuing mission to provide you with exceptional heart care, we have created designated Provider Care Teams.  These Care Teams include your primary Cardiologist (physician) and Advanced Practice Providers (APPs -  Physician Assistants and Nurse Practitioners) who all work together to provide you with the care you need, when you need it.  We recommend signing up for the patient portal called "MyChart".  Sign up information is provided on this After Visit Summary.  MyChart is used to connect with patients for Virtual Visits (Telemedicine).  Patients are able to view lab/test results, encounter notes, upcoming appointments, etc.   Non-urgent messages can be sent to your provider as well.   To learn more about what you can do with MyChart, go to [email or web address].    Your next appointment:   6 month(s)  The format for your next appointment:   In Person  Provider:    You may see DR Harrell Gave END or one of the following Advanced Practice Providers on your designated Care Team:    Murray Hodgkins, NP  Christell Faith, PA-C  Marrianne Mood, PA-C

## 2019-09-17 DIAGNOSIS — I10 Essential (primary) hypertension: Secondary | ICD-10-CM | POA: Insufficient documentation

## 2019-09-17 DIAGNOSIS — I712 Thoracic aortic aneurysm, without rupture, unspecified: Secondary | ICD-10-CM | POA: Insufficient documentation

## 2019-09-17 LAB — COMPREHENSIVE METABOLIC PANEL
ALT: 16 IU/L (ref 0–44)
AST: 21 IU/L (ref 0–40)
Albumin/Globulin Ratio: 1.4 (ref 1.2–2.2)
Albumin: 4.1 g/dL (ref 3.8–4.9)
Alkaline Phosphatase: 85 IU/L (ref 44–121)
BUN/Creatinine Ratio: 14 (ref 9–20)
BUN: 15 mg/dL (ref 6–24)
Bilirubin Total: 0.3 mg/dL (ref 0.0–1.2)
CO2: 25 mmol/L (ref 20–29)
Calcium: 9.2 mg/dL (ref 8.7–10.2)
Chloride: 102 mmol/L (ref 96–106)
Creatinine, Ser: 1.05 mg/dL (ref 0.76–1.27)
GFR calc Af Amer: 91 mL/min/{1.73_m2} (ref >59)
GFR calc non Af Amer: 78 mL/min/{1.73_m2} (ref >59)
Globulin, Total: 3 g/dL (ref 1.5–4.5)
Glucose: 94 mg/dL (ref 65–99)
Potassium: 4 mmol/L (ref 3.5–5.2)
Sodium: 140 mmol/L (ref 134–144)
Total Protein: 7.1 g/dL (ref 6.0–8.5)

## 2019-09-17 LAB — LIPID PANEL
Chol/HDL Ratio: 3.3 ratio (ref 0.0–5.0)
Cholesterol, Total: 118 mg/dL (ref 100–199)
HDL: 36 mg/dL — ABNORMAL LOW (ref >39)
LDL Chol Calc (NIH): 65 mg/dL (ref 0–99)
Triglycerides: 88 mg/dL (ref 0–149)
VLDL Cholesterol Cal: 17 mg/dL (ref 5–40)

## 2019-09-17 LAB — CBC
Hematocrit: 43.4 % (ref 37.5–51.0)
Hemoglobin: 15.4 g/dL (ref 13.0–17.7)
MCH: 32.2 pg (ref 26.6–33.0)
MCHC: 35.5 g/dL (ref 31.5–35.7)
MCV: 91 fL (ref 79–97)
Platelets: 304 10*3/uL (ref 150–450)
RBC: 4.78 x10E6/uL (ref 4.14–5.80)
RDW: 13.4 % (ref 11.6–15.4)
WBC: 6.1 10*3/uL (ref 3.4–10.8)

## 2019-09-17 LAB — MAGNESIUM: Magnesium: 2.1 mg/dL (ref 1.6–2.3)

## 2019-09-21 ENCOUNTER — Other Ambulatory Visit: Payer: Self-pay | Admitting: Nurse Practitioner

## 2019-09-22 DIAGNOSIS — M79672 Pain in left foot: Secondary | ICD-10-CM | POA: Diagnosis not present

## 2019-09-22 DIAGNOSIS — S92345A Nondisplaced fracture of fourth metatarsal bone, left foot, initial encounter for closed fracture: Secondary | ICD-10-CM | POA: Diagnosis not present

## 2019-09-30 ENCOUNTER — Other Ambulatory Visit: Payer: Self-pay | Admitting: *Deleted

## 2019-09-30 MED ORDER — ATORVASTATIN CALCIUM 80 MG PO TABS: 80.0000 mg | ORAL_TABLET | Freq: Every day | ORAL | 2 refills | Status: DC

## 2019-10-08 ENCOUNTER — Ambulatory Visit (INDEPENDENT_AMBULATORY_CARE_PROVIDER_SITE_OTHER): Payer: Managed Care, Other (non HMO)

## 2019-10-08 ENCOUNTER — Other Ambulatory Visit: Payer: Self-pay

## 2019-10-08 DIAGNOSIS — I255 Ischemic cardiomyopathy: Secondary | ICD-10-CM

## 2019-10-08 DIAGNOSIS — I2510 Atherosclerotic heart disease of native coronary artery without angina pectoris: Secondary | ICD-10-CM

## 2019-10-09 DIAGNOSIS — S92322A Displaced fracture of second metatarsal bone, left foot, initial encounter for closed fracture: Secondary | ICD-10-CM | POA: Diagnosis not present

## 2019-10-09 DIAGNOSIS — S92345A Nondisplaced fracture of fourth metatarsal bone, left foot, initial encounter for closed fracture: Secondary | ICD-10-CM | POA: Diagnosis not present

## 2019-10-09 DIAGNOSIS — S92302A Fracture of unspecified metatarsal bone(s), left foot, initial encounter for closed fracture: Secondary | ICD-10-CM | POA: Diagnosis not present

## 2019-10-09 LAB — ECHOCARDIOGRAM COMPLETE
Area-P 1/2: 2.45 cm2
MV M vel: 5.04 m/s
MV Peak grad: 101.6 mmHg
P 1/2 time: 476 msec
Radius: 0.5 cm
S' Lateral: 3.9 cm

## 2019-10-12 ENCOUNTER — Telehealth: Payer: Self-pay | Admitting: *Deleted

## 2019-10-12 DIAGNOSIS — I255 Ischemic cardiomyopathy: Secondary | ICD-10-CM

## 2019-10-12 DIAGNOSIS — I10 Essential (primary) hypertension: Secondary | ICD-10-CM

## 2019-10-12 NOTE — Telephone Encounter (Signed)
No answer. Left message to call back.   

## 2019-10-12 NOTE — Telephone Encounter (Signed)
-----   Message from [name], MD sent at 10/12/2019  7:56 AM EDT ----- Please let Clinton Walsh know that his echocardiogram shows worsening in his left ventricular contraction compared with 2019 (down to 30-35% from 40-45%).  I recommend that we initiate goal-directed medical therapy in a stepwise fashion.  If he is agreeable, I suggest that Clinton Walsh begin taking losartan 25 mg daily with follow-up basic metabolic panel in 2 weeks and office visit with me or an APP in about a month to assess his response to therapy and discuss repeat ischemia evaluation and further escalation of evidence-based heart failure therapy.

## 2019-10-13 NOTE — Telephone Encounter (Signed)
No answer. Left message to call back.   

## 2019-10-14 ENCOUNTER — Encounter: Payer: Self-pay | Admitting: *Deleted

## 2019-10-14 ENCOUNTER — Telehealth: Payer: Self-pay | Admitting: *Deleted

## 2019-10-14 MED ORDER — LOSARTAN POTASSIUM 25 MG PO TABS: 25.0000 mg | ORAL_TABLET | Freq: Every day | ORAL | 3 refills | Status: DC

## 2019-10-14 NOTE — Telephone Encounter (Signed)
No answer. Left message to call back with patient, wife, and daughter. This was the 3rd attempt. Letter with results mailed to patient.

## 2019-10-14 NOTE — Telephone Encounter (Signed)
Error

## 2019-10-14 NOTE — Addendum Note (Signed)
Addended by: Annia Belt on: 10/14/2019 04:38 PM   Modules accepted: Orders

## 2019-10-14 NOTE — Telephone Encounter (Signed)
Patient calling back. The patient has been notified of the result and verbalized understanding.  All questions (if any) were answered. He is aware to start Losartan 25 mg daily, get lab work at [facility] in 2 weeks and he is aware of follow up appointment as scheduled. Rx sent to pharmacy.

## 2019-11-06 DIAGNOSIS — S99922D Unspecified injury of left foot, subsequent encounter: Secondary | ICD-10-CM | POA: Diagnosis not present

## 2019-11-06 DIAGNOSIS — S92322D Displaced fracture of second metatarsal bone, left foot, subsequent encounter for fracture with routine healing: Secondary | ICD-10-CM | POA: Diagnosis not present

## 2019-11-06 DIAGNOSIS — S92302D Fracture of unspecified metatarsal bone(s), left foot, subsequent encounter for fracture with routine healing: Secondary | ICD-10-CM | POA: Diagnosis not present

## 2019-11-06 DIAGNOSIS — S92342D Displaced fracture of fourth metatarsal bone, left foot, subsequent encounter for fracture with routine healing: Secondary | ICD-10-CM | POA: Diagnosis not present
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
## 2019-11-18 ENCOUNTER — Ambulatory Visit: Payer: Managed Care, Other (non HMO) | Admitting: Physician Assistant
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
## 2019-11-20 ENCOUNTER — Encounter: Payer: Self-pay | Admitting: Physician Assistant

## 2019-11-20 ENCOUNTER — Other Ambulatory Visit: Payer: Self-pay

## 2019-11-20 ENCOUNTER — Ambulatory Visit (INDEPENDENT_AMBULATORY_CARE_PROVIDER_SITE_OTHER): Payer: Managed Care, Other (non HMO) | Admitting: Physician Assistant

## 2019-11-20 ENCOUNTER — Telehealth: Payer: Self-pay | Admitting: *Deleted

## 2019-11-20 VITALS — BP 134/80 | HR 55 | Ht 65.0 in | Wt 119.0 lb

## 2019-11-20 DIAGNOSIS — Z01812 Encounter for preprocedural laboratory examination: Secondary | ICD-10-CM

## 2019-11-20 DIAGNOSIS — I2510 Atherosclerotic heart disease of native coronary artery without angina pectoris: Secondary | ICD-10-CM | POA: Diagnosis not present

## 2019-11-20 DIAGNOSIS — I25118 Atherosclerotic heart disease of native coronary artery with other forms of angina pectoris: Secondary | ICD-10-CM

## 2019-11-20 DIAGNOSIS — I712 Thoracic aortic aneurysm, without rupture, unspecified: Secondary | ICD-10-CM

## 2019-11-20 DIAGNOSIS — I255 Ischemic cardiomyopathy: Secondary | ICD-10-CM | POA: Diagnosis not present

## 2019-11-20 DIAGNOSIS — E785 Hyperlipidemia, unspecified: Secondary | ICD-10-CM

## 2019-11-20 DIAGNOSIS — I5022 Chronic systolic (congestive) heart failure: Secondary | ICD-10-CM

## 2019-11-20 DIAGNOSIS — I493 Ventricular premature depolarization: Secondary | ICD-10-CM

## 2019-11-20 DIAGNOSIS — R911 Solitary pulmonary nodule: Secondary | ICD-10-CM

## 2019-11-20 DIAGNOSIS — Z720 Tobacco use: Secondary | ICD-10-CM

## 2019-11-20 DIAGNOSIS — I10 Essential (primary) hypertension: Secondary | ICD-10-CM

## 2019-11-20 NOTE — Patient Instructions (Addendum)
Medication Instructions:  Your physician recommends that you continue on your current medications as directed. Please refer to the Current Medication list given to you today.  *If you need a refill on your cardiac medications before your next appointment, please call your pharmacy*   Lab Work:  Your physician recommends that you have lab work TODAY:  Bmet, CBC  If you have labs (blood work) drawn today and your tests are completely normal, you will receive your results only by: Marland Kitchen MyChart Message (if you have MyChart) OR . A paper copy in the mail If you have any lab test that is abnormal or we need to change your treatment, we will call you to review the results.   Testing/Procedures:  Your physician has requested that you have a cardiac catheterization. Cardiac catheterization is used to diagnose and/or treat various heart conditions. Doctors may recommend this procedure for a number of different reasons. The most common reason is to evaluate chest pain. Chest pain can be a symptom of coronary artery disease (CAD), and cardiac catheterization can show whether plaque is narrowing or blocking your heart's arteries. This procedure is also used to evaluate the valves, as well as measure the blood flow and oxygen levels in different parts of your heart. For further information please visit [email or web address]. Please follow instruction sheet, as given.     Follow-Up: At [facility], you and your health needs are our priority.  As part of our continuing mission to provide you with exceptional heart care, we have created designated Provider Care Teams.  These Care Teams include your primary Cardiologist (physician) and Advanced Practice Providers (APPs -  Physician Assistants and Nurse Practitioners) who all work together to provide you with the care you need, when you need it.  We recommend signing up for the patient portal called "MyChart".  Sign up information is provided on this After  Visit Summary.  MyChart is used to connect with patients for Virtual Visits (Telemedicine).  Patients are able to view lab/test results, encounter notes, upcoming appointments, etc.  Non-urgent messages can be sent to your provider as well.   To learn more about what you can do with MyChart, go to [email or web address].    Your next appointment:   3 week(s)  The format for your next appointment:   In Person  Provider:   You may see [name], MD or one of the following Advanced Practice Providers on your designated Care Team:    Murray Hodgkins, NP  Christell Faith, PA-C  Marrianne Mood, PA-C  Cadence Kathlen Mody, [place]  Laurann Montana, NP    Other Instructions   [facility] Cardiac Cath Instructions   You are scheduled for a Cardiac Cath on: Tuesday 12/01/19  Please arrive at _______am on the day of your procedure  Please expect a call from our [facility] to pre-register you  Do not eat/drink anything after midnight  Someone will need to drive you home  It is recommended someone be with you for the first 24 hours after your procedure  Wear clothes that are easy to get on/off and wear slip on shoes if possible   Medications bring a current list of all medications with you  _X__ You may take all of your medications the morning of your procedure with enough water to swallow safely   Day of your procedure: Arrive at the [facility] entrance.  Free valet service is available.  After entering the [place] [facility] please check-in at the registration desk (  1st desk on your right) to receive your armband. After receiving your armband someone will escort you to the cardiac cath/special procedures waiting area.  The usual length of stay after your procedure is about 2 to 3 hours.  This can vary.  If you have any questions, please call our office at [phone], or you may call the cardiac cath lab at [facility] directly at [phone]    **COVID PRE- TEST: You  will need a COVID TEST prior to the procedure:  LOCATION: [facility] site.  DATE/TIME:  Monday 11/30/19 between [place]

## 2019-11-20 NOTE — Telephone Encounter (Signed)
Attempted to call pt to confirm arrival time is 8:30 AM for heart cath @ [facility] 12/01/19. Procedure scheduled in office today while pt here but we did not have arrival time. Told pt we would call him back to confirm arrival time. Pt has been given all other instructions regarding procedure. Confirm arrival of 8:30 AM @ [facility] when pt returns call.

## 2019-11-21 LAB — CBC
Hematocrit: 45.6 % (ref 37.5–51.0)
Hemoglobin: 15.6 g/dL (ref 13.0–17.7)
MCH: 32 pg (ref 26.6–33.0)
MCHC: 34.2 g/dL (ref 31.5–35.7)
MCV: 93 fL (ref 79–97)
Platelets: 304 10*3/uL (ref 150–450)
RBC: 4.88 x10E6/uL (ref 4.14–5.80)
RDW: 13.3 % (ref 11.6–15.4)
WBC: 4.5 10*3/uL (ref 3.4–10.8)

## 2019-11-21 LAB — BASIC METABOLIC PANEL
BUN/Creatinine Ratio: 16 (ref 9–20)
BUN: 18 mg/dL (ref 6–24)
CO2: 22 mmol/L (ref 20–29)
Calcium: 9 mg/dL (ref 8.7–10.2)
Chloride: 105 mmol/L (ref 96–106)
Creatinine, Ser: 1.1 mg/dL (ref 0.76–1.27)
GFR calc Af Amer: 86 mL/min/{1.73_m2} (ref >59)
GFR calc non Af Amer: 74 mL/min/{1.73_m2} (ref >59)
Glucose: 85 mg/dL (ref 65–99)
Potassium: 4.5 mmol/L (ref 3.5–5.2)
Sodium: 141 mmol/L (ref 134–144)

## 2019-11-23 NOTE — Telephone Encounter (Signed)
Attempted to call pt regarding his schedule appointment on 11/30 @ 08:30am for heart cath and to arrive at [place], advised pt to please call clinic to confirm he received a VM regarding his upcoming appt.

## 2019-11-24 NOTE — Telephone Encounter (Signed)
Spoke to pt and confirmed arrival time for heart cath 11/30 is at 8:30 AM at the [facility]. Pt verbalized understanding. No further questions at this time.

## 2019-11-30 ENCOUNTER — Other Ambulatory Visit
Admission: RE | Admit: 2019-11-30 | Discharge: 2019-11-30 | Disposition: A | Discharge status: Home / Self Care | Payer: Managed Care, Other (non HMO) | Source: Ambulatory Visit | Attending: Internal Medicine | Admitting: Internal Medicine

## 2019-11-30 ENCOUNTER — Other Ambulatory Visit: Payer: Self-pay

## 2019-11-30 DIAGNOSIS — Z20822 Contact with and (suspected) exposure to covid-19: Secondary | ICD-10-CM | POA: Insufficient documentation

## 2019-11-30 DIAGNOSIS — Z01818 Encounter for other preprocedural examination: Secondary | ICD-10-CM | POA: Insufficient documentation

## 2019-11-30 LAB — SARS CORONAVIRUS 2 (TAT 6-24 HRS): SARS Coronavirus 2: NEGATIVE

## 2019-12-01 ENCOUNTER — Telehealth: Payer: Self-pay | Admitting: *Deleted

## 2019-12-01 ENCOUNTER — Encounter
Admission: RE | Disposition: A | Discharge status: Home / Self Care | Payer: Managed Care, Other (non HMO) | Source: Home / Self Care | Attending: Internal Medicine

## 2019-12-01 ENCOUNTER — Ambulatory Visit
Admission: RE | Admit: 2019-12-01 | Discharge: 2019-12-01 | Disposition: A | Discharge status: Home / Self Care | Payer: Managed Care, Other (non HMO) | Attending: Internal Medicine | Admitting: Internal Medicine

## 2019-12-01 DIAGNOSIS — I200 Unstable angina: Secondary | ICD-10-CM

## 2019-12-01 SURGERY — LEFT HEART CATH AND CORONARY ANGIOGRAPHY
Anesthesia: Moderate Sedation | Laterality: Left

## 2019-12-01 NOTE — Telephone Encounter (Signed)
Patient at [facility] this morning for left heart cath by Dr End. Scheduled by mistake for today and Dr End not in hospital today.  Spoke with patient and apologized for the scheduling mistake. Rescheduled patient for left heart cath on Thursday, December 2 with Dr End at [facility]. Patient agreeable and verbalized understanding to arrive at the [facility] main entrance of [place] at 11:30 am.  Routing to Dr Saunders Revel and Thurmond Butts to make them aware.

## 2019-12-02 ENCOUNTER — Telehealth: Payer: Self-pay | Admitting: *Deleted

## 2019-12-02 NOTE — Telephone Encounter (Signed)
Pt contacted pre-catheterization scheduled at [facility] for: Thursday December 03, 2019 1:30 PM Verified arrival time and place: [facility]) at: 11:30 AM   No solid food after midnight prior to cath, clear liquids until 5 AM day of procedure.  AM meds can be  taken pre-cath with sips of water including: ASA 81 mg   Confirmed patient has responsible adult to drive home post procedure and be with patient first 24 hours after arriving home: yes  You are allowed ONE visitor in the waiting room during the time you are at the hospital for your procedure. Both you and your visitor must wear a mask once you enter the hospital.       COVID-19 Pre-Screening Questions:  . In the past 14 days have you had any symptoms concerning for COVID-19 infection (fever, chills, cough, or new shortness of breath)? no . In the past 14 days have you been around anyone with known Covid 19? no  Reviewed procedure/mask/visitor instructions, COVID-19 questions with patient.

## 2019-12-03 ENCOUNTER — Ambulatory Visit (HOSPITAL_COMMUNITY)
Admission: RE | Admit: 2019-12-03 | Discharge: 2019-12-04 | Disposition: A | Discharge status: Home / Self Care | Payer: Managed Care, Other (non HMO) | Attending: Internal Medicine | Admitting: Internal Medicine

## 2019-12-03 ENCOUNTER — Other Ambulatory Visit: Payer: Self-pay

## 2019-12-03 ENCOUNTER — Ambulatory Visit (HOSPITAL_COMMUNITY): Payer: Managed Care, Other (non HMO)

## 2019-12-03 ENCOUNTER — Encounter (HOSPITAL_COMMUNITY)
Admission: RE | Disposition: A | Discharge status: Home / Self Care | Payer: Self-pay | Source: Home / Self Care | Attending: Internal Medicine

## 2019-12-03 ENCOUNTER — Encounter (HOSPITAL_COMMUNITY): Payer: Self-pay | Admitting: Internal Medicine

## 2019-12-03 DIAGNOSIS — I2510 Atherosclerotic heart disease of native coronary artery without angina pectoris: Secondary | ICD-10-CM

## 2019-12-03 DIAGNOSIS — Z79899 Other long term (current) drug therapy: Secondary | ICD-10-CM | POA: Insufficient documentation

## 2019-12-03 DIAGNOSIS — N132 Hydronephrosis with renal and ureteral calculous obstruction: Secondary | ICD-10-CM

## 2019-12-03 DIAGNOSIS — R001 Bradycardia, unspecified: Secondary | ICD-10-CM | POA: Insufficient documentation

## 2019-12-03 DIAGNOSIS — Z7982 Long term (current) use of aspirin: Secondary | ICD-10-CM | POA: Diagnosis not present

## 2019-12-03 DIAGNOSIS — G459 Transient cerebral ischemic attack, unspecified: Secondary | ICD-10-CM | POA: Diagnosis present

## 2019-12-03 DIAGNOSIS — J449 Chronic obstructive pulmonary disease, unspecified: Secondary | ICD-10-CM | POA: Diagnosis not present

## 2019-12-03 DIAGNOSIS — E785 Hyperlipidemia, unspecified: Secondary | ICD-10-CM | POA: Insufficient documentation

## 2019-12-03 DIAGNOSIS — N1330 Unspecified hydronephrosis: Secondary | ICD-10-CM | POA: Insufficient documentation

## 2019-12-03 DIAGNOSIS — Z882 Allergy status to sulfonamides status: Secondary | ICD-10-CM | POA: Insufficient documentation

## 2019-12-03 DIAGNOSIS — Z955 Presence of coronary angioplasty implant and graft: Secondary | ICD-10-CM

## 2019-12-03 DIAGNOSIS — I200 Unstable angina: Secondary | ICD-10-CM | POA: Diagnosis present

## 2019-12-03 DIAGNOSIS — I110 Hypertensive heart disease with heart failure: Secondary | ICD-10-CM | POA: Insufficient documentation

## 2019-12-03 DIAGNOSIS — R0602 Shortness of breath: Secondary | ICD-10-CM

## 2019-12-03 DIAGNOSIS — I712 Thoracic aortic aneurysm, without rupture: Secondary | ICD-10-CM | POA: Insufficient documentation

## 2019-12-03 DIAGNOSIS — F17200 Nicotine dependence, unspecified, uncomplicated: Secondary | ICD-10-CM | POA: Diagnosis present

## 2019-12-03 DIAGNOSIS — I5022 Chronic systolic (congestive) heart failure: Secondary | ICD-10-CM | POA: Insufficient documentation

## 2019-12-03 DIAGNOSIS — I252 Old myocardial infarction: Secondary | ICD-10-CM | POA: Diagnosis not present

## 2019-12-03 DIAGNOSIS — I25110 Atherosclerotic heart disease of native coronary artery with unstable angina pectoris: Secondary | ICD-10-CM | POA: Insufficient documentation

## 2019-12-03 DIAGNOSIS — I255 Ischemic cardiomyopathy: Secondary | ICD-10-CM | POA: Diagnosis not present

## 2019-12-03 DIAGNOSIS — M353 Polymyalgia rheumatica: Secondary | ICD-10-CM | POA: Diagnosis not present

## 2019-12-03 HISTORY — PX: INTRAVASCULAR ULTRASOUND/IVUS: CATH118244

## 2019-12-03 HISTORY — PX: LEFT HEART CATH AND CORONARY ANGIOGRAPHY: CATH118249

## 2019-12-03 HISTORY — PX: CORONARY STENT INTERVENTION: CATH118234

## 2019-12-03 LAB — POCT ACTIVATED CLOTTING TIME
Activated Clotting Time: 321 seconds
Activated Clotting Time: 273 seconds
Activated Clotting Time: 303 seconds

## 2019-12-03 SURGERY — LEFT HEART CATH AND CORONARY ANGIOGRAPHY
Anesthesia: LOCAL

## 2019-12-03 MED ORDER — SODIUM CHLORIDE 0.9 % IV SOLN: INTRAVENOUS | Status: DC

## 2019-12-03 MED ORDER — LABETALOL HCL 5 MG/ML IV SOLN: 10.0000 mg | INTRAVENOUS | Status: AC | PRN

## 2019-12-03 MED ORDER — ASPIRIN 81 MG PO CHEW: 81.0000 mg | CHEWABLE_TABLET | ORAL | Status: DC

## 2019-12-03 MED ORDER — HEPARIN (PORCINE) IN NACL 1000-0.9 UT/500ML-% IV SOLN
INTRAVENOUS | Status: DC | PRN
  Administered 2019-12-03 (×2): 500 mL

## 2019-12-03 MED ORDER — FUROSEMIDE 10 MG/ML IJ SOLN
20.0000 mg | Freq: Once | INTRAMUSCULAR | Status: AC
  Administered 2019-12-03: 20 mg via INTRAVENOUS
  Filled 2019-12-03: qty 2

## 2019-12-03 MED ORDER — ONDANSETRON HCL 4 MG/2ML IJ SOLN: 4.0000 mg | Freq: Four times a day (QID) | INTRAMUSCULAR | Status: DC | PRN

## 2019-12-03 MED ORDER — FLUTICASONE-UMECLIDIN-VILANT 100-62.5-25 MCG/INH IN AEPB: 1.0000 | INHALATION_SPRAY | Freq: Every day | RESPIRATORY_TRACT | Status: DC

## 2019-12-03 MED ORDER — TICAGRELOR 90 MG PO TABS
ORAL_TABLET | ORAL | Status: AC
  Filled 2019-12-03: qty 2

## 2019-12-03 MED ORDER — MIDAZOLAM HCL 2 MG/2ML IJ SOLN
INTRAMUSCULAR | Status: DC | PRN
  Administered 2019-12-03 (×2): 1 mg via INTRAVENOUS

## 2019-12-03 MED ORDER — FENTANYL CITRATE (PF) 100 MCG/2ML IJ SOLN
INTRAMUSCULAR | Status: DC | PRN
  Administered 2019-12-03 (×2): 25 ug via INTRAVENOUS

## 2019-12-03 MED ORDER — ENOXAPARIN SODIUM 40 MG/0.4ML ~~LOC~~ SOLN
40.0000 mg | SUBCUTANEOUS | Status: DC
  Administered 2019-12-04: 40 mg via SUBCUTANEOUS
  Filled 2019-12-03: qty 0.4

## 2019-12-03 MED ORDER — NITROGLYCERIN 1 MG/10 ML FOR IR/CATH LAB
INTRA_ARTERIAL | Status: AC
  Filled 2019-12-03: qty 10

## 2019-12-03 MED ORDER — HEPARIN SODIUM (PORCINE) 1000 UNIT/ML IJ SOLN
INTRAMUSCULAR | Status: DC | PRN
  Administered 2019-12-03: 2000 [IU] via INTRAVENOUS
  Administered 2019-12-03: 3000 [IU] via INTRAVENOUS
  Administered 2019-12-03: 5000 [IU] via INTRAVENOUS

## 2019-12-03 MED ORDER — ALBUTEROL SULFATE HFA 108 (90 BASE) MCG/ACT IN AERS
1.0000 | INHALATION_SPRAY | Freq: Four times a day (QID) | RESPIRATORY_TRACT | Status: DC | PRN
  Filled 2019-12-03: qty 6.7

## 2019-12-03 MED ORDER — NITROGLYCERIN 1 MG/10 ML FOR IR/CATH LAB
INTRA_ARTERIAL | Status: DC | PRN
  Administered 2019-12-03 (×4): 200 ug via INTRACORONARY

## 2019-12-03 MED ORDER — TICAGRELOR 90 MG PO TABS: 90.0000 mg | ORAL_TABLET | Freq: Two times a day (BID) | ORAL | Status: DC

## 2019-12-03 MED ORDER — SODIUM CHLORIDE 0.9% FLUSH: 3.0000 mL | INTRAVENOUS | Status: DC | PRN

## 2019-12-03 MED ORDER — SODIUM CHLORIDE 0.9 % IV SOLN: 250.0000 mL | INTRAVENOUS | Status: DC | PRN

## 2019-12-03 MED ORDER — SODIUM CHLORIDE 0.9% FLUSH
3.0000 mL | Freq: Two times a day (BID) | INTRAVENOUS | Status: DC
  Administered 2019-12-04: 3 mL via INTRAVENOUS

## 2019-12-03 MED ORDER — HEPARIN (PORCINE) IN NACL 1000-0.9 UT/500ML-% IV SOLN
INTRAVENOUS | Status: AC
  Filled 2019-12-03: qty 1000

## 2019-12-03 MED ORDER — VERAPAMIL HCL 2.5 MG/ML IV SOLN
INTRAVENOUS | Status: DC | PRN
  Administered 2019-12-03: 10 mL via INTRA_ARTERIAL

## 2019-12-03 MED ORDER — FENTANYL CITRATE (PF) 100 MCG/2ML IJ SOLN
INTRAMUSCULAR | Status: AC
  Filled 2019-12-03: qty 2

## 2019-12-03 MED ORDER — HYDRALAZINE HCL 20 MG/ML IJ SOLN: 10.0000 mg | INTRAMUSCULAR | Status: AC | PRN

## 2019-12-03 MED ORDER — ASPIRIN EC 81 MG PO TBEC
81.0000 mg | DELAYED_RELEASE_TABLET | Freq: Every day | ORAL | Status: DC
  Administered 2019-12-04: 81 mg via ORAL
  Filled 2019-12-03: qty 1

## 2019-12-03 MED ORDER — HEPARIN SODIUM (PORCINE) 1000 UNIT/ML IJ SOLN
INTRAMUSCULAR | Status: AC
  Filled 2019-12-03: qty 1

## 2019-12-03 MED ORDER — MIDAZOLAM HCL 2 MG/2ML IJ SOLN
INTRAMUSCULAR | Status: AC
  Filled 2019-12-03: qty 2

## 2019-12-03 MED ORDER — IOHEXOL 350 MG/ML SOLN
INTRAVENOUS | Status: DC | PRN
  Administered 2019-12-03: 70 mL

## 2019-12-03 MED ORDER — TICAGRELOR 90 MG PO TABS
ORAL_TABLET | ORAL | Status: DC | PRN
  Administered 2019-12-03: 180 mg via ORAL

## 2019-12-03 MED ORDER — LOSARTAN POTASSIUM 25 MG PO TABS
25.0000 mg | ORAL_TABLET | Freq: Every day | ORAL | Status: DC
  Administered 2019-12-04: 25 mg via ORAL
  Filled 2019-12-03: qty 1

## 2019-12-03 MED ORDER — ATORVASTATIN CALCIUM 80 MG PO TABS
80.0000 mg | ORAL_TABLET | Freq: Every day | ORAL | Status: DC
  Administered 2019-12-04: 80 mg via ORAL
  Filled 2019-12-03: qty 1

## 2019-12-03 MED ORDER — VERAPAMIL HCL 2.5 MG/ML IV SOLN
INTRAVENOUS | Status: AC
  Filled 2019-12-03: qty 2

## 2019-12-03 MED ORDER — ACETAMINOPHEN 325 MG PO TABS: 650.0000 mg | ORAL_TABLET | ORAL | Status: DC | PRN

## 2019-12-03 MED ORDER — LIDOCAINE HCL (PF) 1 % IJ SOLN
INTRAMUSCULAR | Status: AC
  Filled 2019-12-03: qty 30

## 2019-12-03 MED ORDER — NITROGLYCERIN 0.4 MG SL SUBL: 0.4000 mg | SUBLINGUAL_TABLET | SUBLINGUAL | Status: DC | PRN

## 2019-12-03 MED ORDER — LIDOCAINE HCL (PF) 1 % IJ SOLN
INTRAMUSCULAR | Status: DC | PRN
  Administered 2019-12-03: 2 mL

## 2019-12-03 MED ORDER — ALPRAZOLAM 0.25 MG PO TABS
0.2500 mg | ORAL_TABLET | Freq: Every evening | ORAL | Status: DC | PRN
  Administered 2019-12-03: 0.25 mg via ORAL
  Filled 2019-12-03: qty 1

## 2019-12-03 MED ORDER — SODIUM CHLORIDE 0.9% FLUSH
3.0000 mL | Freq: Two times a day (BID) | INTRAVENOUS | Status: DC
  Administered 2019-12-03 – 2019-12-04 (×2): 3 mL via INTRAVENOUS

## 2019-12-03 MED ORDER — FLUTICASONE FUROATE-VILANTEROL 100-25 MCG/INH IN AEPB
1.0000 | INHALATION_SPRAY | Freq: Every day | RESPIRATORY_TRACT | Status: DC
  Filled 2019-12-03: qty 28

## 2019-12-03 MED ORDER — UMECLIDINIUM BROMIDE 62.5 MCG/INH IN AEPB
1.0000 | INHALATION_SPRAY | Freq: Every day | RESPIRATORY_TRACT | Status: DC
  Filled 2019-12-03: qty 7

## 2019-12-03 SURGICAL SUPPLY — 22 items
BALLN SAPPHIRE 2.5X12 (BALLOONS) ×2
BALLN SAPPHIRE ~~LOC~~ 2.5X12 (BALLOONS) ×4 IMPLANT
BALLN SAPPHIRE ~~LOC~~ 3.25X15 (BALLOONS) ×2 IMPLANT
BALLOON SAPPHIRE 2.5X12 (BALLOONS) ×1 IMPLANT
CATH INFINITI 5FR MULTPACK ANG (CATHETERS) ×2 IMPLANT
CATH OPTICROSS HD (CATHETERS) ×2 IMPLANT
CATH VISTA GUIDE 6FR XBLAD3.5 (CATHETERS) ×2 IMPLANT
DEVICE RAD COMP TR BAND LRG (VASCULAR PRODUCTS) ×2 IMPLANT
GLIDESHEATH SLEND A-KIT 6F 22G (SHEATH) ×2 IMPLANT
GUIDEWIRE INQWIRE 1.5J.035X260 (WIRE) ×1 IMPLANT
INQWIRE 1.5J .035X260CM (WIRE) ×2
KIT ENCORE 26 ADVANTAGE (KITS) ×2 IMPLANT
KIT HEART LEFT (KITS) ×2 IMPLANT
PACK CARDIAC CATHETERIZATION (CUSTOM PROCEDURE TRAY) ×2 IMPLANT
SLED PULL BACK IVUS (MISCELLANEOUS) ×2 IMPLANT
STENT RESOLUTE ONYX 2.25X12 (Permanent Stent) ×2 IMPLANT
STENT RESOLUTE ONYX 2.75X26 (Permanent Stent) ×2 IMPLANT
TRANSDUCER W/STOPCOCK (MISCELLANEOUS) ×2 IMPLANT
TUBING ART PRESS 72  MALE/FEM (TUBING) ×1
TUBING ART PRESS 72 MALE/FEM (TUBING) ×1 IMPLANT
TUBING CIL FLEX 10 FLL-RA (TUBING) ×2 IMPLANT
WIRE RUNTHROUGH .014X180CM (WIRE) ×2 IMPLANT

## 2019-12-03 NOTE — Interval H&P Note (Signed)
History and Physical Interval Note:  12/03/2019 2:41 PM  Clinton Walsh  has presented today for surgery, with the diagnosis of unstable angina.  The various methods of treatment have been discussed with the patient and family. After consideration of risks, benefits and other options for treatment, the patient has consented to  Procedure(s): LEFT HEART CATH AND CORONARY ANGIOGRAPHY (N/A) as a surgical intervention.  The patient's history has been reviewed, patient examined, no change in status, stable for surgery.  I have reviewed the patient's chart and labs.  Questions were answered to the patient's satisfaction.    Cath Lab Visit (complete for each Cath Lab visit)  Clinical Evaluation Leading to the Procedure:   ACS: No.  Non-ACS:    Anginal Classification: CCS IV  Anti-ischemic medical therapy: No Therapy  Non-Invasive Test Results: No non-invasive testing performed (LVEF 30-35% by echo -> high risk)  Prior CABG: No previous CABG  Crytal Pensinger

## 2019-12-03 NOTE — Brief Op Note (Signed)
BRIEF CARDIAC CATHETERIZATION NOTE  12/03/2019  4:25 PM  PATIENT:  Clinton Walsh  59 y.o. male  PRE-OPERATIVE DIAGNOSIS:  Unstable angina  POST-OPERATIVE DIAGNOSIS:  Unstable angina  PROCEDURE:  Procedure(s): LEFT HEART CATH AND CORONARY ANGIOGRAPHY (N/A) CORONARY STENT INTERVENTION (N/A) Intravascular Ultrasound/IVUS (N/A)  SURGEON:  Surgeon(s) and Role:    * Pariss Hommes, Harrell Gave, MD - Primary  FINDINGS: 1. 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 2. Patent D1 stent with minimal ISR. 3. Patent proximal RCA stent with focal 25% in-stent restenosis. 4. Normal left ventricular filling pressure. 5. Successful PCI to mid/distal LAD using Resolute Onyx 2.75 x 26 mm (proximal) and 2.25 x 12 mm (distal) drug-eluting stents that cover previously placed LAD stent with 0% residual stenosis and TIMI-3 flow.  Recommendations: 1. Dual antiplatelet therapy with aspirin and ticagrelor for at least 6 months, ideally longer. 2. Aggressive secondary prevention and optimization of evidence-based heart failure therapy.  [name], MD [facility]

## 2019-12-03 NOTE — Plan of Care (Signed)

## 2019-12-04 ENCOUNTER — Encounter (HOSPITAL_COMMUNITY): Payer: Self-pay | Admitting: Internal Medicine

## 2019-12-04 DIAGNOSIS — Z7982 Long term (current) use of aspirin: Secondary | ICD-10-CM | POA: Diagnosis not present

## 2019-12-04 DIAGNOSIS — I25110 Atherosclerotic heart disease of native coronary artery with unstable angina pectoris: Secondary | ICD-10-CM | POA: Diagnosis not present

## 2019-12-04 DIAGNOSIS — I712 Thoracic aortic aneurysm, without rupture: Secondary | ICD-10-CM | POA: Diagnosis not present

## 2019-12-04 DIAGNOSIS — I110 Hypertensive heart disease with heart failure: Secondary | ICD-10-CM | POA: Diagnosis not present

## 2019-12-04 DIAGNOSIS — I252 Old myocardial infarction: Secondary | ICD-10-CM | POA: Diagnosis not present

## 2019-12-04 DIAGNOSIS — N1330 Unspecified hydronephrosis: Secondary | ICD-10-CM | POA: Diagnosis not present

## 2019-12-04 DIAGNOSIS — I200 Unstable angina: Secondary | ICD-10-CM | POA: Diagnosis not present

## 2019-12-04 DIAGNOSIS — E785 Hyperlipidemia, unspecified: Secondary | ICD-10-CM | POA: Diagnosis not present

## 2019-12-04 DIAGNOSIS — R001 Bradycardia, unspecified: Secondary | ICD-10-CM | POA: Diagnosis not present

## 2019-12-04 DIAGNOSIS — M353 Polymyalgia rheumatica: Secondary | ICD-10-CM | POA: Diagnosis not present

## 2019-12-04 DIAGNOSIS — Z955 Presence of coronary angioplasty implant and graft: Secondary | ICD-10-CM | POA: Diagnosis not present

## 2019-12-04 DIAGNOSIS — Z882 Allergy status to sulfonamides status: Secondary | ICD-10-CM | POA: Diagnosis not present

## 2019-12-04 DIAGNOSIS — I5022 Chronic systolic (congestive) heart failure: Secondary | ICD-10-CM | POA: Diagnosis not present

## 2019-12-04 DIAGNOSIS — I255 Ischemic cardiomyopathy: Secondary | ICD-10-CM | POA: Diagnosis not present

## 2019-12-04 DIAGNOSIS — Z79899 Other long term (current) drug therapy: Secondary | ICD-10-CM | POA: Diagnosis not present

## 2019-12-04 LAB — CBC
HCT: 43.7 % (ref 39.0–52.0)
Hemoglobin: 14.9 g/dL (ref 13.0–17.0)
MCH: 31.2 pg (ref 26.0–34.0)
MCHC: 34.1 g/dL (ref 30.0–36.0)
MCV: 91.6 fL (ref 80.0–100.0)
Platelets: 256 10*3/uL (ref 150–400)
RBC: 4.77 MIL/uL (ref 4.22–5.81)
RDW: 13.7 % (ref 11.5–15.5)
WBC: 5.8 10*3/uL (ref 4.0–10.5)
nRBC: 0 % (ref 0.0–0.2)

## 2019-12-04 LAB — BASIC METABOLIC PANEL
Anion gap: 12 (ref 5–15)
BUN: 13 mg/dL (ref 6–20)
CO2: 22 mmol/L (ref 22–32)
Calcium: 9 mg/dL (ref 8.9–10.3)
Chloride: 104 mmol/L (ref 98–111)
Creatinine, Ser: 1.01 mg/dL (ref 0.61–1.24)
GFR, Estimated: >60 mL/min (ref >60)
Glucose, Bld: 87 mg/dL (ref 70–99)
Potassium: 3.7 mmol/L (ref 3.5–5.1)
Sodium: 138 mmol/L (ref 135–145)

## 2019-12-04 MED ORDER — CLOPIDOGREL BISULFATE 75 MG PO TABS: 75.0000 mg | ORAL_TABLET | Freq: Every day | ORAL | 11 refills | Status: DC

## 2019-12-04 MED ORDER — CLOPIDOGREL BISULFATE 75 MG PO TABS: 75.0000 mg | ORAL_TABLET | Freq: Every day | ORAL | Status: DC

## 2019-12-04 MED ORDER — CLOPIDOGREL BISULFATE 300 MG PO TABS
300.0000 mg | ORAL_TABLET | Freq: Once | ORAL | Status: AC
  Administered 2019-12-04: 300 mg via ORAL
  Filled 2019-12-04: qty 1

## 2019-12-04 NOTE — Discharge Instructions (Signed)
Radial Site Care  This sheet gives you information about how to care for yourself after your procedure. Your health care provider may also give you more specific instructions. If you have problems or questions, contact your health care provider. What can I expect after the procedure? After the procedure, it is common to have:  Bruising and tenderness at the catheter insertion area. Follow these instructions at home: Medicines  Take over-the-counter and prescription medicines only as told by your health care provider. Insertion site care  Follow instructions from your health care provider about how to take care of your insertion site. Make sure you: ? Wash your hands with soap and water before you change your bandage (dressing). If soap and water are not available, use hand sanitizer. ? Change your dressing as told by your health care provider. ? Leave stitches (sutures), skin glue, or adhesive strips in place. These skin closures may need to stay in place for 2 weeks or longer. If adhesive strip edges start to loosen and curl up, you may trim the loose edges. Do not remove adhesive strips completely unless your health care provider tells you to do that.  Check your insertion site every day for signs of infection. Check for: ? Redness, swelling, or pain. ? Fluid or blood. ? Pus or a bad smell. ? Warmth.  Do not take baths, swim, or use a hot tub until your health care provider approves.  You may shower 24-48 hours after the procedure, or as directed by your health care provider. ? Remove the dressing and gently wash the site with plain soap and water. ? Pat the area dry with a clean towel. ? Do not rub the site. That could cause bleeding.  Do not apply powder or lotion to the site. Activity   For 24 hours after the procedure, or as directed by your health care provider: ? Do not flex or bend the affected arm. ? Do not push or pull heavy objects with the affected arm. ? Do not  drive yourself home from the hospital or clinic. You may drive 24 hours after the procedure unless your health care provider tells you not to. ? Do not operate machinery or power tools.  Do not lift anything that is heavier than 10 lb (4.5 kg), or the limit that you are told, until your health care provider says that it is safe.  Ask your health care provider when it is okay to: ? Return to work or school. ? Resume usual physical activities or sports. ? Resume sexual activity. General instructions  If the catheter site starts to bleed, raise your arm and put firm pressure on the site. If the bleeding does not stop, get help right away. This is a medical emergency.  If you went home on the same day as your procedure, a responsible adult should be with you for the first 24 hours after you arrive home.  Keep all follow-up visits as told by your health care provider. This is important. Contact a health care provider if:  You have a fever.  You have redness, swelling, or yellow drainage around your insertion site. Get help right away if:  You have unusual pain at the radial site.  The catheter insertion area swells very fast.  The insertion area is bleeding, and the bleeding does not stop when you hold steady pressure on the area.  Your arm or hand becomes pale, cool, tingly, or numb. These symptoms may represent a serious problem   that is an emergency. Do not wait to see if the symptoms will go away. Get medical help right away. Call your local emergency services (911 in the [place] Do not drive yourself to the hospital. Summary  After the procedure, it is common to have bruising and tenderness at the site.  Follow instructions from your health care provider about how to take care of your radial site wound. Check the wound every day for signs of infection.  Do not lift anything that is heavier than 10 lb (4.5 kg), or the limit that you are told, until your health care provider says  that it is safe. This information is not intended to replace advice given to you by your health care provider. Make sure you discuss any questions you have with your health care provider. Document Revised: 01/23/2017 Document Reviewed: 01/23/2017 Elsevier Patient Education  2020 [employer]  

## 2019-12-04 NOTE — Progress Notes (Signed)
RN went over discharge information with patient. Patient's wife present. RN gave discharge info to patient and his wife to take home with them. RN removed L PIV. Belongings sent with patient/patient's wife. Vital signs stable at discharge. RN asked if patient had any questions and addressed any concerns. Patient was discharged by NT via wheelchair to private vehicle (wife driving).

## 2019-12-04 NOTE — Discharge Summary (Addendum)
Discharge Summary    Patient ID: JC VERON MRN: [id number]; DOB: 10-28-1960  Admit date: 12/03/2019 Discharge date: 12/04/2019  Primary Care Provider: Glean Hess, MD  Primary Cardiologist: [name], MD   Discharge Diagnoses    Principal Problem:   Unstable angina [facility]) Active Problems:   Hyperlipidemia LDL goal <70   Tobacco use disorder   Bradycardia   Ischemic cardiomyopathy   Dilated left renal pelvis   Diagnostic Studies/Procedures   CORONARY STENT INTERVENTION  Intravascular Ultrasound/IVUS  LEFT HEART CATH AND CORONARY ANGIOGRAPHY  Conclusion  Conclusions: 1. Severe single-vessel coronary artery disease with up to 95% in-stent restenosis in the mid LAD as well as de novo disease of up to 70% flanking the stent. 2. Patent D1 and proximal RCA stents with mild in-stent restenosis. 3. Moderate, nonobstructive distal LCx disease; this is a relatively small branch. 4. Upper normal to mildly elevated left ventricular filling pressure. 5. Successful PCI to mid/distal LAD with Resolute Onyx 2.75 x 26 mm (proximal) and 2.25 x 12 mm (distal) drug-eluting stents with 0% residual stenosis and TIMI-3 flow.  The stents completely cover the old LAD stent. 6. Incidental note of dilated left renal pelvis, incompletely characterized on this study.  Recommendations: 1. Overnight extended recovery. 2. Dual antiplatelet therapy with aspirin and ticagrelor for at least 6 months, ideally longer. 3. Aggressive secondary prevention. 4. Gentle diuresis should dyspnea or other evidence of volume overload develop. 5. Obtain renal ultrasound given incidental finding of dilated left renal pelvis during today's angiogram.  Diagnostic Dominance: Right  Intervention      History of Present Illness     Clinton Walsh is a 59 y.o. male with history of CAD status post multiple PCIs, chronic systolic CHF 2nd to ICM, polymyalgia rheumatica, thoracic ascending  aortic aneurysm measuring 4.3 cm by CT in 05/2019, hypertension, hyperlipidemia, pulmonary nodules, COPD (followed by pulmonary), TIA and tobacco use admitted after outpatient cath for observation.   He has thoracic aortic aneurysm and abdominal aortic aneurysm >> followed by [facility].   Patient has chronic dyspnea and chest discomfort which has worsen. Echo 10/2019 showed LVEF of 30-35% (down from 40-45% in 2019), global hypokinesis, grade 2 diastolic dysfunction, normal RV systolic function with mildly enlarged RV cavity size, normal PASP, mild to moderate mitral regurgitation, and trivial aortic insufficiency. Did not tolerated BB in past 2nd to bradycardia and fatigue. Started on Losartan. Due to ongoing symptoms recommended cardiac catheterization.   Hospital Course     Consultants: None  1. CAD - Cardiac cath showed severe single-vessel coronary artery disease with up to 95% in-stent restenosis in the mid LAD as well as de novo disease of up to 70% flanking the stent s/p Successful PCI to mid/distal LAD with Resolute Onyx 2.75 x 26 mm (proximal) and 2.25 x 12 mm (distal) drug-eluting stents covering old stent. Medical therapy for other non obstructive CAD. He was given loading dose of Brilinta but had severe dyspnea afterwards. No overnight complications. He declined Brilinta in AM. Given loading dose of Plavix 300mg  and then 75mg  starting tomorrow. Continue ASA and statin. No chest pain. Cath site stable. Renal function and hemoglobin stable.   2. ICM - Appears Euvolemic - No BB due to bradycardia and fatigue in past, discussed retrial but declined.  - Continue Losartan, up titrate as outpatient  3.Dilated left renal pelvis/ Bilateral hydronephrosis - Incidental finding of dilated left renal pelvis during cath.  -Renal US showed bilateral hydronephrosis  left greater than right. Bladder trabeculation is noted which may be related to outlet obstruction which would account  for hydronephrosis. Correlate with laboratory values. - Patient is asymptomatic - Prior CT of abdomen in 07/2018 did showed "Stable distended urinary bladder with diffuse wall thickening and trabeculation, consistent with chronic bladder outlet obstruction". - Discussed with nephrologist who recommended urology evaluation  - Spoke with Urology who recommended outpatient work up and will arrange outpatient Urology follow up at [facility]   Did the patient have an acute coronary syndrome (MI, NSTEMI, STEMI, etc) this admission?:  No                               Did the patient have a percutaneous coronary intervention (stent / angioplasty)?:  Yes.     Cath/PCI Registry Performance & Quality Measures: 1. Aspirin prescribed? - Yes 2. ADP Receptor Inhibitor (Plavix/Clopidogrel, Brilinta/Ticagrelor or Effient/Prasugrel) prescribed (includes medically managed patients)? - Yes 3. High Intensity Statin (Lipitor 40-80mg  or Crestor 20-40mg ) prescribed? - Yes 4. For EF <40%, was ACEI/ARB prescribed? - Yes 5. For EF <40%, Aldosterone Antagonist (Spironolactone or Eplerenone) prescribed? - No - Reason:  Consider as outpatient  6. Cardiac Rehab Phase II ordered? - Yes   Discharge Vitals Blood pressure 132/80, pulse (!) 56, temperature 97.8 F (36.6 C), temperature source Oral, resp. rate 14, height 5\' 5"  (1.651 m), weight 53.1 kg, SpO2 93 %.  Filed Weights   12/03/19 1202  Weight: 53.1 kg   Physical Exam Constitutional:      Appearance: Normal appearance.  HENT:     Head: Normocephalic.     Nose: Nose normal.  Eyes:     Extraocular Movements: Extraocular movements intact.     Pupils: Pupils are equal, round, and reactive to light.  Cardiovascular:     Rate and Rhythm: Normal rate and regular rhythm.     Heart sounds: Normal heart sounds.     Comments: Left radial cath site without hematoma or ecchymosis Pulmonary:     Effort: Pulmonary effort is normal.     Breath sounds: Normal breath  sounds.  Abdominal:     General: Abdomen is flat. Bowel sounds are normal.     Palpations: Abdomen is soft.  Musculoskeletal:        General: Normal range of motion.  Skin:    General: Skin is warm and dry.  Neurological:     General: No focal deficit present.     Mental Status: He is alert and oriented to person, place, and time.  Psychiatric:        Mood and Affect: Mood normal.        Behavior: Behavior normal.    Labs & Radiologic Studies    CBC Recent Labs    12/04/19 0057  WBC 5.8  HGB 14.9  HCT 43.7  MCV 91.6  PLT 400   Basic Metabolic Panel Recent Labs    12/04/19 0057  NA 138  K 3.7  CL 104  CO2 22  GLUCOSE 87  BUN 13  CREATININE 1.01  CALCIUM 9.0  _____________  CARDIAC CATHETERIZATION  Result Date: 12/03/2019 Conclusions: 1. Severe single-vessel coronary artery disease with up to 95% in-stent restenosis in the mid LAD as well as de novo disease of up to 70% flanking the stent. 2. Patent D1 and proximal RCA stents with mild in-stent restenosis. 3. Moderate, nonobstructive distal LCx disease; this is a relatively small branch.  4. Upper normal to mildly elevated left ventricular filling pressure. 5. Successful PCI to mid/distal LAD with Resolute Onyx 2.75 x 26 mm (proximal) and 2.25 x 12 mm (distal) drug-eluting stents with 0% residual stenosis and TIMI-3 flow.  The stents completely cover the old LAD stent. 6. Incidental note of dilated left renal pelvis, incompletely characterized on this study. Recommendations: 1. Overnight extended recovery. 2. Dual antiplatelet therapy with aspirin and ticagrelor for at least 6 months, ideally longer. 3. Aggressive secondary prevention. 4. Gentle diuresis should dyspnea or other evidence of volume overload develop. 5. Obtain renal ultrasound given incidental finding of dilated left renal pelvis during today's angiogram. [name], MD [facility]   US RENAL  Result Date: 12/03/2019 CLINICAL DATA:  Hydronephrosis  seen on the left on recent cardiac catheterization EXAM: RENAL / URINARY TRACT ULTRASOUND COMPLETE COMPARISON:  07/14/2018 FINDINGS: Right Kidney: Renal measurements: 120 = volume: Mild hydronephrosis is noted. ML. Echogenicity within normal limits. No mass or hydronephrosis visualized. Left Kidney: Renal measurements: 11.0 x 6.3 x 3.9 cm. = volume: 141 mL. Moderate hydronephrosis is noted on the left with prominent extrarenal pelvis similar to that seen on recent cardiac catheterization. Bladder: Bladder is well distended with trabeculation which may be related to bladder outlet obstruction. Other: None. IMPRESSION: Bilateral hydronephrosis left greater than right. Bladder trabeculation is noted which may be related to outlet obstruction which would account for hydronephrosis. Correlate with laboratory values. Electronically Signed   By: Inez Catalina M.D.   On: 12/03/2019 22:42   DG CHEST PORT 1 VIEW  Result Date: 12/03/2019 CLINICAL DATA:  Shortness of breath EXAM: PORTABLE CHEST 1 VIEW COMPARISON:  06/15/2019 FINDINGS: Cardiac shadow is within normal limits. Changes of prior coronary stenting are again seen. Aortic calcifications are noted. Mild emphysematous changes are seen. Some partially calcified scarring in the right apex is again seen and stable. No new focal infiltrate or effusion is noted. No bony is seen. IMPRESSION: Chronic scarring in the left apex. COPD without acute abnormality. Electronically Signed   By: Inez Catalina M.D.   On: 12/03/2019 20:13   Disposition   Pt is being discharged home today in good condition.  Follow-up Plans & Appointments     Follow-up Information    End, Harrell Gave, MD. Go on 12/16/2019.   Specialty: Cardiology Why: @10 :40am for hospital follow up  Contact information: [address] [place] [id number] [phone]              Discharge Instructions    AMB Referral to Cardiac Rehabilitation - Phase II   Complete by: As  directed    Diagnosis: Coronary Stents   After initial evaluation and assessments completed: Virtual Based Care may be provided alone or in conjunction with Phase 2 Cardiac Rehab based on patient barriers.: Yes   Ambulatory referral to Urology   Complete by: As directed    For hydronephrosis and bladder outlet obstruction.      Discharge Medications   Allergies as of 12/04/2019      Reactions   Sulfa Antibiotics Rash      Medication List    STOP taking these medications   omeprazole 20 MG capsule Commonly known as: PRILOSEC     TAKE these medications   albuterol 108 (90 Base) MCG/ACT inhaler Commonly known as: VENTOLIN HFA Inhale 1-2 puffs into the lungs every 6 (six) hours as needed for wheezing or shortness of breath.   aspirin EC 81 MG tablet Take 1  tablet (81 mg total) by mouth daily. Swallow whole.   atorvastatin 80 MG tablet Commonly known as: LIPITOR Take 1 tablet (80 mg total) by mouth daily.   clopidogrel 75 MG tablet Commonly known as: PLAVIX Take 1 tablet (75 mg total) by mouth daily. Start taking on: December 05, 2019   losartan 25 MG tablet Commonly known as: COZAAR Take 1 tablet (25 mg total) by mouth daily.   nitroGLYCERIN 0.4 MG SL tablet Commonly known as: NITROSTAT Place 0.4 mg under the tongue every 5 (five) minutes as needed for chest pain.   Trelegy Ellipta 100-62.5-25 MCG/INH Aepb Generic drug: Fluticasone-Umeclidin-Vilant Inhale 1 puff into the lungs daily.        Outstanding Labs/Studies   None  Duration of Discharge Encounter   Greater than 30 minutes including physician time.  Jarrett Soho, PA 12/04/2019, 8:57 AM   Patient seen and examined with Robbie Lis PA.  Agree as above, with the following exceptions and changes as noted below. Patient feels well and is ready to go home. Gen: NAD, CV: irregular rhythm, no murmurs, Lungs: clear, Abd: soft, Extrem: Warm, well perfused, no edema, Neuro/Psych: alert and  oriented x 3, normal mood and affect. All available labs, radiology testing, previous records reviewed.  I have instructed the patient that dual antiplatelet therapy should be taken for 1 year without interruption.  We have discussed the consequences of interrupted dual antiplatelet therapy and the risk for in-stent thrombosis. He did not tolerate brilinta due to SOB, so transitioned and reloaded with plavix. Continue ASA. No BB due to patient preference, fatigue and bradycardia in past.   Urology recommends outpatient follow up for bilateral hydronephrosis.   Follow up Dr. Saunders Revel 12/16/19.  Elouise Munroe, MD 12/04/19 10:13 AM

## 2019-12-04 NOTE — Progress Notes (Signed)
CARDIAC REHAB PHASE I   PRE:  Rate/Rhythm: 56 SB, brief trigeminy    BP: sitting 138/76    SaO2: 95 RA  MODE:  Ambulation: 400 ft   POST:  Rate/Rhythm: 84 SR with PVCs, at rest bigeminy 63    BP: sitting 158/78     SaO2: 97 RA  Pt donned his foot boot then ambulated hall. SOB toward end (his norm). Denied CP, sts he hasn't had any of his PTA CP since stent. Significant PVCs at times. Discussed stent, Plavix, restrictions, smoking cessation, diet, exercise, NTG and CRPII. Pt wants to quit smoking and is contemplating. He has one cigarette in his car. Encouraged him to break it, gave strategies for quitting. Pt sts he is active but does not formally exercise, not interested. Also not interested in CRPII as he tried it in the past and his BP was elevated. Will place referral but does not want to be called. Gave low sodium diets. Pt sts he does not have fluid retention issues. [place], ACSM 12/04/2019 9:21 AM

## 2019-12-08 ENCOUNTER — Ambulatory Visit (INDEPENDENT_AMBULATORY_CARE_PROVIDER_SITE_OTHER): Payer: Managed Care, Other (non HMO) | Admitting: Urology

## 2019-12-08 ENCOUNTER — Other Ambulatory Visit: Payer: Self-pay

## 2019-12-08 ENCOUNTER — Other Ambulatory Visit: Payer: Self-pay | Admitting: Primary Care

## 2019-12-08 ENCOUNTER — Encounter: Payer: Self-pay | Admitting: Urology

## 2019-12-08 VITALS — BP 136/79 | HR 69 | Ht 65.0 in | Wt 117.0 lb

## 2019-12-08 DIAGNOSIS — N400 Enlarged prostate without lower urinary tract symptoms: Secondary | ICD-10-CM

## 2019-12-08 DIAGNOSIS — N1330 Unspecified hydronephrosis: Secondary | ICD-10-CM | POA: Diagnosis not present

## 2019-12-08 DIAGNOSIS — R339 Retention of urine, unspecified: Secondary | ICD-10-CM

## 2019-12-08 LAB — BLADDER SCAN AMB NON-IMAGING: Scan Result: 801
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
## 2019-12-08 NOTE — Progress Notes (Signed)
Continuous Intermittent Catheterization  Due to urinary retention patient is present today for a teaching of self I & O Catheterization. Patient was given detailed verbal and printed instructions of self catheterization. Patient was cleaned and prepped in a sterile fashion.  With instruction and assistance patient declined in office demonstration, ok per Dr. Erlene Quan. Patient was given a sample bag with supplies to take home.  Instructions were given per Dr. Erlene Quan for patient to cath 4 times daily.  An order was placed with [employer] for catheters to be sent to the patient's home. Patient is to follow up 1 month with PSA and RUS prior  Performed by: Verlene Mayer, CMA  Additional Notes: one month

## 2019-12-08 NOTE — Patient Instructions (Signed)

## 2019-12-09 LAB — URINALYSIS, COMPLETE
Bilirubin, UA: NEGATIVE
Glucose, UA: NEGATIVE
Ketones, UA: NEGATIVE
Leukocytes,UA: NEGATIVE
Nitrite, UA: NEGATIVE
Protein,UA: NEGATIVE
RBC, UA: NEGATIVE
Specific Gravity, UA: 1.02 (ref 1.005–1.030)
Urobilinogen, Ur: 0.2 mg/dL (ref 0.2–1.0)
pH, UA: 6 (ref 5.0–7.5)

## 2019-12-09 LAB — MICROSCOPIC EXAMINATION: Bacteria, UA: NONE SEEN

## 2019-12-10 ENCOUNTER — Ambulatory Visit: Payer: Managed Care, Other (non HMO) | Admitting: Internal Medicine

## 2019-12-13 DIAGNOSIS — R339 Retention of urine, unspecified: Secondary | ICD-10-CM | POA: Diagnosis not present

## 2019-12-15 ENCOUNTER — Encounter: Payer: Self-pay | Admitting: Internal Medicine
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
## 2019-12-16 ENCOUNTER — Ambulatory Visit (INDEPENDENT_AMBULATORY_CARE_PROVIDER_SITE_OTHER): Payer: PRIVATE HEALTH INSURANCE | Admitting: Internal Medicine

## 2019-12-16 ENCOUNTER — Encounter: Payer: Self-pay | Admitting: Internal Medicine

## 2019-12-16 ENCOUNTER — Other Ambulatory Visit: Payer: Self-pay

## 2019-12-16 ENCOUNTER — Encounter: Payer: Self-pay | Admitting: Primary Care

## 2019-12-16 VITALS — BP 120/70 | HR 66 | Ht 65.0 in | Wt 121.5 lb

## 2019-12-16 DIAGNOSIS — I712 Thoracic aortic aneurysm, without rupture, unspecified: Secondary | ICD-10-CM

## 2019-12-16 DIAGNOSIS — I714 Abdominal aortic aneurysm, without rupture, unspecified: Secondary | ICD-10-CM

## 2019-12-16 DIAGNOSIS — I2510 Atherosclerotic heart disease of native coronary artery without angina pectoris: Secondary | ICD-10-CM | POA: Diagnosis not present

## 2019-12-16 DIAGNOSIS — I10 Essential (primary) hypertension: Secondary | ICD-10-CM

## 2019-12-16 DIAGNOSIS — R339 Retention of urine, unspecified: Secondary | ICD-10-CM

## 2019-12-16 DIAGNOSIS — R911 Solitary pulmonary nodule: Secondary | ICD-10-CM | POA: Diagnosis not present

## 2019-12-16 NOTE — Patient Instructions (Signed)
Medication Instructions:  Your physician recommends that you continue on your current medications as directed. Please refer to the Current Medication list given to you today.  *If you need a refill on your cardiac medications before your next appointment, please call your pharmacy*   Lab Work: none If you have labs (blood work) drawn today and your tests are completely normal, you will receive your results only by: Marland Kitchen MyChart Message (if you have MyChart) OR . A paper copy in the mail If you have any lab test that is abnormal or we need to change your treatment, we will call you to review the results.   Testing/Procedures: Non-Cardiac CT Angiography (CTA) of the chest, abdomen and pelvic, is a special type of CT scan that uses a computer to produce multi-dimensional views of major blood vessels throughout the body. In CT angiography, a contrast material is injected through an IV to help visualize the blood vessels  I will call you with the date, time and location.    Follow-Up: At [facility], you and your health needs are our priority.  As part of our continuing mission to provide you with exceptional heart care, we have created designated Provider Care Teams.  These Care Teams include your primary Cardiologist (physician) and Advanced Practice Providers (APPs -  Physician Assistants and Nurse Practitioners) who all work together to provide you with the care you need, when you need it.  We recommend signing up for the patient portal called "MyChart".  Sign up information is provided on this After Visit Summary.  MyChart is used to connect with patients for Virtual Visits (Telemedicine).  Patients are able to view lab/test results, encounter notes, upcoming appointments, etc.  Non-urgent messages can be sent to your provider as well.   To learn more about what you can do with MyChart, go to [email or web address].    Your next appointment:   3 month(s)  The format for your next  appointment:   In Person  Provider:   You may see [name], MD or one of the following Advanced Practice Providers on your designated Care Team:    Murray Hodgkins, NP  Christell Faith, PA-C  Marrianne Mood, PA-C  Cadence [place], [place]  Laurann Montana, NP

## 2019-12-17 ENCOUNTER — Encounter: Payer: Self-pay | Admitting: Internal Medicine

## 2019-12-17 DIAGNOSIS — R339 Retention of urine, unspecified: Secondary | ICD-10-CM | POA: Insufficient documentation

## 2019-12-18 ENCOUNTER — Telehealth: Payer: Self-pay | Admitting: *Deleted

## 2019-12-18 DIAGNOSIS — S92342D Displaced fracture of fourth metatarsal bone, left foot, subsequent encounter for fracture with routine healing: Secondary | ICD-10-CM | POA: Diagnosis not present

## 2019-12-18 DIAGNOSIS — S99922D Unspecified injury of left foot, subsequent encounter: Secondary | ICD-10-CM | POA: Diagnosis not present

## 2019-12-18 DIAGNOSIS — M79672 Pain in left foot: Secondary | ICD-10-CM | POA: Diagnosis not present

## 2019-12-18 DIAGNOSIS — S92302D Fracture of unspecified metatarsal bone(s), left foot, subsequent encounter for fracture with routine healing: Secondary | ICD-10-CM | POA: Diagnosis not present

## 2019-12-18 NOTE — Telephone Encounter (Signed)
Patient is scheduled for CTA chest/abd/pelvis on January 06, 2020 at the [facility]. Patient has authorization on file.  Patient will need to arrive at 8:30 am and have nothing to eat or drink for 4 hours prior.  Attempted to call patient to give the information.  Phone went straight to voicemail and no VM set up, unable to leave a message.

## 2019-12-21 NOTE — Telephone Encounter (Signed)
Spoke to patient and he verbalized understanding of the date, time and pre procedural instructions.

## 2020-01-06 ENCOUNTER — Other Ambulatory Visit: Payer: Self-pay

## 2020-01-06 ENCOUNTER — Ambulatory Visit
Admission: RE | Admit: 2020-01-06 | Discharge: 2020-01-06 | Disposition: A | Discharge status: Home / Self Care | Payer: Medicare Other | Source: Ambulatory Visit | Attending: Internal Medicine | Admitting: Internal Medicine

## 2020-01-06 DIAGNOSIS — I714 Abdominal aortic aneurysm, without rupture, unspecified: Secondary | ICD-10-CM

## 2020-01-06 DIAGNOSIS — I517 Cardiomegaly: Secondary | ICD-10-CM | POA: Diagnosis not present

## 2020-01-06 DIAGNOSIS — I712 Thoracic aortic aneurysm, without rupture, unspecified: Secondary | ICD-10-CM

## 2020-01-06 DIAGNOSIS — R911 Solitary pulmonary nodule: Secondary | ICD-10-CM | POA: Diagnosis not present

## 2020-01-06 DIAGNOSIS — I708 Atherosclerosis of other arteries: Secondary | ICD-10-CM | POA: Diagnosis not present

## 2020-01-06 DIAGNOSIS — J9811 Atelectasis: Secondary | ICD-10-CM | POA: Diagnosis not present

## 2020-01-06 DIAGNOSIS — I701 Atherosclerosis of renal artery: Secondary | ICD-10-CM | POA: Diagnosis not present

## 2020-01-06 DIAGNOSIS — I774 Celiac artery compression syndrome: Secondary | ICD-10-CM | POA: Diagnosis not present

## 2020-01-06 MED ORDER — IOHEXOL 350 MG/ML SOLN
100.0000 mL | Freq: Once | INTRAVENOUS | Status: AC | PRN
  Administered 2020-01-06: 100 mL via INTRAVENOUS

## 2020-01-07 ENCOUNTER — Telehealth: Payer: Self-pay | Admitting: Internal Medicine

## 2020-01-07 ENCOUNTER — Encounter: Payer: Self-pay | Admitting: *Deleted

## 2020-01-07 NOTE — Telephone Encounter (Signed)
[name], MD  01/07/2020 2:04 PM EST      Please let Mr. Throne know that his thoracic aortic aneurysm is stable in size. His abdominal aortic aneurysm has grown slightly and will need to continue to be followed with serial examinations on an annual basis. There is a new small nodule in his lungs, which should be monitored by Dr. Mortimer Fries. I will forward these results to him for his review as well. For now, Mr. Carsten should continue his current medications and f/u as previously recommended.

## 2020-01-07 NOTE — Telephone Encounter (Signed)
Attempted to call the patient x 2 tries. No answer- no voice mail box set up.   Phone note started.

## 2020-01-08 ENCOUNTER — Other Ambulatory Visit: Payer: Medicare Other

## 2020-01-08 ENCOUNTER — Other Ambulatory Visit: Payer: Self-pay

## 2020-01-08 DIAGNOSIS — R339 Retention of urine, unspecified: Secondary | ICD-10-CM

## 2020-01-08 NOTE — Telephone Encounter (Signed)
The patient has been notified of the result and verbalized understanding.  All questions (if any) were answered. Clinton Ivory Ciarra Braddy, RN 01/08/2020 9:18 AM  Results released to My Chart.

## 2020-01-09 LAB — PSA: Prostate Specific Ag, Serum: 0.6 ng/mL (ref 0.0–4.0)

## 2020-01-12 ENCOUNTER — Telehealth: Payer: Self-pay | Admitting: *Deleted

## 2020-01-12 NOTE — Telephone Encounter (Signed)
Unable to leave VM, called to inform patient to get RUS as discussed previously last week.

## 2020-01-13 ENCOUNTER — Ambulatory Visit: Payer: Medicare Other | Admitting: Urology

## 2020-01-13 ENCOUNTER — Other Ambulatory Visit: Payer: Self-pay

## 2020-01-18 ENCOUNTER — Emergency Department: Payer: Medicare Other

## 2020-01-18 ENCOUNTER — Other Ambulatory Visit: Payer: Self-pay

## 2020-01-18 ENCOUNTER — Encounter: Payer: Self-pay | Admitting: Emergency Medicine

## 2020-01-18 ENCOUNTER — Encounter: Payer: Self-pay | Admitting: Urology

## 2020-01-18 ENCOUNTER — Emergency Department
Admission: EM | Admit: 2020-01-18 | Discharge: 2020-01-18 | Disposition: A | Discharge status: Home / Self Care | Payer: Medicare Other | Attending: Emergency Medicine | Admitting: Emergency Medicine

## 2020-01-18 DIAGNOSIS — J449 Chronic obstructive pulmonary disease, unspecified: Secondary | ICD-10-CM | POA: Diagnosis not present

## 2020-01-18 DIAGNOSIS — I10 Essential (primary) hypertension: Secondary | ICD-10-CM | POA: Diagnosis not present

## 2020-01-18 DIAGNOSIS — Z955 Presence of coronary angioplasty implant and graft: Secondary | ICD-10-CM | POA: Diagnosis not present

## 2020-01-18 DIAGNOSIS — I2510 Atherosclerotic heart disease of native coronary artery without angina pectoris: Secondary | ICD-10-CM | POA: Diagnosis not present

## 2020-01-18 DIAGNOSIS — I712 Thoracic aortic aneurysm, without rupture: Secondary | ICD-10-CM | POA: Diagnosis not present

## 2020-01-18 DIAGNOSIS — Z5321 Procedure and treatment not carried out due to patient leaving prior to being seen by health care provider: Secondary | ICD-10-CM | POA: Insufficient documentation

## 2020-01-18 DIAGNOSIS — N50812 Left testicular pain: Secondary | ICD-10-CM | POA: Insufficient documentation

## 2020-01-18 DIAGNOSIS — Z882 Allergy status to sulfonamides status: Secondary | ICD-10-CM | POA: Diagnosis not present

## 2020-01-18 DIAGNOSIS — Z7902 Long term (current) use of antithrombotics/antiplatelets: Secondary | ICD-10-CM | POA: Diagnosis not present

## 2020-01-18 DIAGNOSIS — K219 Gastro-esophageal reflux disease without esophagitis: Secondary | ICD-10-CM | POA: Diagnosis not present

## 2020-01-18 DIAGNOSIS — Z79899 Other long term (current) drug therapy: Secondary | ICD-10-CM | POA: Diagnosis not present

## 2020-01-18 DIAGNOSIS — I714 Abdominal aortic aneurysm, without rupture: Secondary | ICD-10-CM | POA: Diagnosis not present

## 2020-01-18 DIAGNOSIS — M353 Polymyalgia rheumatica: Secondary | ICD-10-CM | POA: Diagnosis not present

## 2020-01-18 DIAGNOSIS — Z7982 Long term (current) use of aspirin: Secondary | ICD-10-CM | POA: Diagnosis not present

## 2020-01-18 DIAGNOSIS — F17210 Nicotine dependence, cigarettes, uncomplicated: Secondary | ICD-10-CM | POA: Diagnosis not present

## 2020-01-18 DIAGNOSIS — I493 Ventricular premature depolarization: Secondary | ICD-10-CM | POA: Diagnosis not present

## 2020-01-18 DIAGNOSIS — R52 Pain, unspecified: Secondary | ICD-10-CM | POA: Diagnosis not present

## 2020-01-18 DIAGNOSIS — N453 Epididymo-orchitis: Secondary | ICD-10-CM | POA: Diagnosis not present

## 2020-01-18 DIAGNOSIS — N50819 Testicular pain, unspecified: Secondary | ICD-10-CM

## 2020-01-18 NOTE — ED Triage Notes (Signed)
Pt to ED via EMS with c/o testicle pain and swelling x 3 days. Pt states pain worse with urination, pt states feels like a knot is in the inside of his testicles. Pt states. Pt states self caths, however pain is worse with self cathing.

## 2020-01-18 NOTE — ED Triage Notes (Signed)
Pt in via EMS from home with c/o groin and left testicle pain. Denies injuries, has hx of the same pain with it.

## 2020-01-18 NOTE — ED Notes (Signed)
Unable to locate patient (called several times) in lobby. Ultrasound aware.

## 2020-01-20 ENCOUNTER — Encounter: Payer: Self-pay | Admitting: Internal Medicine

## 2020-01-20 ENCOUNTER — Other Ambulatory Visit: Payer: Self-pay

## 2020-01-20 ENCOUNTER — Ambulatory Visit (INDEPENDENT_AMBULATORY_CARE_PROVIDER_SITE_OTHER): Payer: Medicare Other | Admitting: Internal Medicine

## 2020-01-20 VITALS — BP 124/78 | HR 60 | Temp 97.5°F | Ht 65.0 in | Wt 119.6 lb

## 2020-01-20 DIAGNOSIS — J449 Chronic obstructive pulmonary disease, unspecified: Secondary | ICD-10-CM

## 2020-01-20 DIAGNOSIS — R911 Solitary pulmonary nodule: Secondary | ICD-10-CM

## 2020-01-20 DIAGNOSIS — I255 Ischemic cardiomyopathy: Secondary | ICD-10-CM

## 2020-01-20 DIAGNOSIS — F17210 Nicotine dependence, cigarettes, uncomplicated: Secondary | ICD-10-CM | POA: Diagnosis not present

## 2020-01-20 MED ORDER — TRELEGY ELLIPTA 200-62.5-25 MCG/INH IN AEPB: 1.0000 | INHALATION_SPRAY | Freq: Every day | RESPIRATORY_TRACT | 0 refills | Status: DC

## 2020-01-20 MED ORDER — TRELEGY ELLIPTA 200-62.5-25 MCG/INH IN AEPB: 1.0000 | INHALATION_SPRAY | RESPIRATORY_TRACT | 10 refills | Status: AC

## 2020-01-20 NOTE — Progress Notes (Signed)
[facility] Consultation      Date: 01/20/2020,   MRN# [id number] Willaim Mode Tine 01/05/1960     Admission                  Current  Clinton Walsh is a 60 y.o. old male seen in consultation for abnormal CT scan at the request of Dr. Janese Banks.  SYNPOSIS   60 yr old male with prior h/o CAD in 2013. Patient had previous  cardiac catheter and another stent placed in July Patient currently on ASA and Brilanta threapy  Patient had CT chest to assess for PE/dissection and found to have incidental LUL nodule, patient then had follow up PET scan which showed increased SUV uptake  Repeat CT chest on 06/28/16 shows a stable persistent left upper lobe nodule No significant change in size  Repeat CT chest 04/03/2017 Shows stable nodules No increase in size Severe emphysema noted  Office spiro shows moderate to severe obstruction ratio48% fev1 2L 63% fef25/75 0.8L 29% ONO+hypoxia  CT chest 05/2018 1. No acute abnormality detected. 2. Multiple pulmonary nodules are again identified bilaterally, all of which are stable from prior study. No new worrisome pulmonary nodules identified on today's exam. 3. Ectatic ascending thoracic aorta measuring 4.3 cm (previously measuring 4 cm). Recommend annual imaging followup by CTA or MRA. The CT chest  was Independently Reviewed By Me Today      CHIEF COMPLAINT:  FOLLOW UP SEVERE COPD FOLLOW UP ABNORMAL CT CHEST   HPI Chronic shortness of breath and dyspnea exertion Remains stable at this time however continues to smoke and continues to vape Chronic wheezing He refuses to stop smoking to stop vaping  Last CT chest April 2019 showed stable left upper lobe nodular scarring Repeat CT chest January 06, 2020 shows stable left upper lobe nodular scarring however there is a new right upper lobe lung nodule 0.6 cm    No exacerbation at this time No evidence of heart failure at this time No evidence or signs of  infection at this time No respiratory distress No fevers, chills, nausea, vomiting, diarrhea No evidence of lower extremity edema No evidence hemoptysis  Patient has very poor respiratory compliance and very poor respiratory status  He uses oxygen at nighttime  Benefits from therapy at night   Patient has severe ischemic cardiomyopathy and heart disease Continues to smoke Has significant increase in abdominal aortic aneurysm and needs proper follow-up with vascular surgery and cardiology  Smoking Assessment and Cessation Counseling Upon further questioning, Patient smokes 1 PPD I have advised patient to quit/stop smoking as soon as possible due to high risk for multiple medical problems  Patient is NOT willing to quit smoking  I have advised patient that we can assist and have options of Nicotine replacement therapy. I also advised patient on behavioral therapy and can provide oral medication therapy in conjunction with the other therapies Follow up next Office visit  for assessment of smoking cessation Smoking cessation counseling advised for 4 minutes    No signs of infecti    Current Medication:  Current Outpatient Medications:  .  albuterol (VENTOLIN HFA) 108 (90 Base) MCG/ACT inhaler, Inhale 1-2 puffs into the lungs every 6 (six) hours as needed for wheezing or shortness of breath., Disp: 1 g, Rfl: 1 .  aspirin EC 81 MG tablet, Take 1 tablet (81 mg total) by mouth daily. Swallow whole., Disp: 90 tablet, Rfl: 3 .  atorvastatin (LIPITOR) 80 MG tablet, Take  1 tablet (80 mg total) by mouth daily., Disp: 90 tablet, Rfl: 2 .  clopidogrel (PLAVIX) 75 MG tablet, Take 1 tablet (75 mg total) by mouth daily., Disp: 30 tablet, Rfl: 11 .  Fluticasone-Umeclidin-Vilant (TRELEGY ELLIPTA) 100-62.5-25 MCG/INH AEPB, Inhale 1 puff into the lungs daily., Disp: 60 each, Rfl: 2 .  nitroGLYCERIN (NITROSTAT) 0.4 MG SL tablet, Place 0.4 mg under the tongue every 5 (five) minutes as needed for chest  pain., Disp: , Rfl:  .  losartan (COZAAR) 25 MG tablet, Take 1 tablet (25 mg total) by mouth daily., Disp: 90 tablet, Rfl: 3    ALLERGIES   Sulfa antibiotics     REVIEW OF SYSTEMS   Review of Systems  Constitutional: Positive for malaise/fatigue. Negative for chills, diaphoresis, fever and weight loss.  HENT: Negative for congestion and hearing loss.   Eyes: Negative for blurred vision and double vision.  Respiratory: Positive for cough, shortness of breath and wheezing. Negative for hemoptysis and sputum production.   Cardiovascular: Positive for orthopnea. Negative for chest pain and palpitations.  Gastrointestinal: Negative for heartburn.  Skin: Negative for rash.  Neurological: Negative for weakness.  All other systems reviewed and are negative.            IMAGING    I have Independently reviewed images of  PET scan on 04/26/16    Interpretation:LUL nodular scarring with SUV 2.7 mediastinal adenopathy  I have independently reviewed the images of CT scan on 06/28/16 on 07/30/16 which showed persistent left upper lobe nodular scarring that has not changed in size   Follow up CT chest 10/16/16 -Stable 2.6 cm left upper lobe pulmonary nodule compared to prior studies dating back to 6 months ago.  -stable mildly enlarged mediastinal lymph nodes.   ONO +hypoxia I believe the first he is 6 ~5 days who is in the middle of before returning about 10 to 14 days as listed worsening right Follow-up CT chest 04/20/2017 Stable nodule stable adenopathy    CT chest 05/2018 -No acute abnormality detected. -Multiple pulmonary nodules are again identified bilaterally, all of which are stable from prior study. No new worrisome pulmonary nodules identified on today's exam. The CT chest  was Independently Reviewed    CT chest 01/06/2020  Idependently Reviewed By Me Today oblong linear at least 3.9 x 0.9 cm opacity within the left upper lobe unchanged since at least the  04/2016 examination and thus favored to represent an area of scarring.  CT abdomen pelvis January 06, 2020 Increased size and abdominal aortic aneurysm   CT abdomen pelvis January 06, 2020 ASSESSMENT/PLAN     60 year old thin white male with a history of acute MI  With an incidental finding of a left upper lobe lung nodule and adenopathy with severe COPD setting of progressive worsening of COPD with emphysema with active smoking and vaping, with severe ischemic cardiomyopathy with an EF of 40% along with increasing abdominal aortic aneurysm   Chronic shortness of breath and dyspnea exertion Definitely related to severe COPD and cardiomyopathy as well as ongoing tobacco abuse with severely deconditioned state No exacerbation at this time  Severe COPD Gold stage D Continue nebulized therapy as prescribed We will increase Trelegy therapy to 200 Albuterol as needed  Smoking cessation strongly advised All of his diseases are caused by smoking He has abdominal aortic aneurysm as well as severe ischemic heart disease as well as a new right upper lobe lung nodule-all findings are strongly associated with his underlying smoking  Left upper lobe lung nodule with adenopathy-stable over the last 3 years Most likely scar tissue however will need repeated CT chest in 6 months his current CAT scan does not show any significant changes and shows stable appearance since 2000 19 April  CT chest January 06, 2020 Right upper lobe lung nodule 0.6 cm which is new Will need further CT chest in 6 months  AAA There is a aneurysm noted in the descending aorta Patient will need vascular surgery consultation for further assessment and evaluation Patient has previously seen vascular surgery in the past and did not have a pleasant experience Patient is followed by cardiology and will need further evaluation  Patient is at very high risk for malignancy He will need ongoing CT scans of his chest every 6  months Patient is a very high risk for postop pulmonary complications and cardiac complications due to his underlying CAD ischemia COPD and is a very high risk for cardiac arrest and death with any type of procedure  Ischemic cardiomyopathy EF 40% Follow-up cardiology  Patient satisfied with Plan of action and management. All questions answered His overall prognosis is very poor with chronic debilitating respiratory disease in the setting of ongoing tobacco abuse Patient does have order of smelling marijuana during office visit today   COVID-19 EDUCATION: The signs and symptoms of COVID-19 were discussed with the patient and how to seek care for testing.  The importance of social distancing was discussed today. Hand Washing Techniques and avoid touching face was advised.  MEDICATION ADJUSTMENTS/LABS AND TESTS ORDERED:  CURRENT MEDICATIONS REVIEWED AT LENGTH WITH PATIENT TODAY Increase Trelegy inhaler therapy to 200 Follow-up CT chest in 6 months Patient with new right upper lobe lung nodule Recommend smoking cessation Follow-up vascular surgery and cardiology for abdominal aortic aneurysm  Patient satisfied with Plan of action and management. All questions answered Follow up in 6 months   TOTAL TIME SPENT 31 minutes   Corrin Parker, M.D.  [facility]  Medical Director [place] Director [facility] - Closed Cardio-Pulmonary Department

## 2020-01-20 NOTE — Patient Instructions (Signed)
Increase Trelegy inhaler therapy to 200 Follow-up CT chest in 6 months Patient with new right upper lobe lung nodule Recommend smoking cessation Follow-up vascular surgery and cardiology for abdominal aortic aneurysm

## 2020-01-25 ENCOUNTER — Other Ambulatory Visit: Payer: Self-pay

## 2020-01-25 ENCOUNTER — Ambulatory Visit
Admission: RE | Admit: 2020-01-25 | Discharge: 2020-01-25 | Disposition: A | Discharge status: Home / Self Care | Payer: Medicare Other | Source: Ambulatory Visit | Attending: Urology | Admitting: Urology

## 2020-01-25 DIAGNOSIS — N3289 Other specified disorders of bladder: Secondary | ICD-10-CM | POA: Diagnosis not present

## 2020-01-25 DIAGNOSIS — N401 Enlarged prostate with lower urinary tract symptoms: Secondary | ICD-10-CM | POA: Insufficient documentation

## 2020-01-25 DIAGNOSIS — R339 Retention of urine, unspecified: Secondary | ICD-10-CM | POA: Diagnosis not present

## 2020-01-25 DIAGNOSIS — R39198 Other difficulties with micturition: Secondary | ICD-10-CM | POA: Diagnosis not present

## 2020-01-25 DIAGNOSIS — R338 Other retention of urine: Secondary | ICD-10-CM | POA: Insufficient documentation

## 2020-01-25 DIAGNOSIS — Q6101 Congenital single renal cyst: Secondary | ICD-10-CM | POA: Insufficient documentation

## 2020-01-25 DIAGNOSIS — N281 Cyst of kidney, acquired: Secondary | ICD-10-CM | POA: Diagnosis not present

## 2020-02-27 DIAGNOSIS — R1030 Lower abdominal pain, unspecified: Secondary | ICD-10-CM | POA: Diagnosis not present

## 2020-02-27 DIAGNOSIS — I2510 Atherosclerotic heart disease of native coronary artery without angina pectoris: Secondary | ICD-10-CM | POA: Diagnosis not present

## 2020-02-27 DIAGNOSIS — Z20822 Contact with and (suspected) exposure to covid-19: Secondary | ICD-10-CM | POA: Diagnosis not present

## 2020-02-27 DIAGNOSIS — Z681 Body mass index (BMI) 19 or less, adult: Secondary | ICD-10-CM | POA: Diagnosis not present

## 2020-02-27 DIAGNOSIS — J449 Chronic obstructive pulmonary disease, unspecified: Secondary | ICD-10-CM | POA: Diagnosis not present

## 2020-02-27 DIAGNOSIS — N200 Calculus of kidney: Secondary | ICD-10-CM | POA: Diagnosis not present

## 2020-02-27 DIAGNOSIS — R319 Hematuria, unspecified: Secondary | ICD-10-CM | POA: Diagnosis not present

## 2020-02-27 DIAGNOSIS — R310 Gross hematuria: Secondary | ICD-10-CM | POA: Diagnosis not present

## 2020-02-27 DIAGNOSIS — I714 Abdominal aortic aneurysm, without rupture: Secondary | ICD-10-CM | POA: Diagnosis not present

## 2020-02-27 DIAGNOSIS — I10 Essential (primary) hypertension: Secondary | ICD-10-CM | POA: Diagnosis not present

## 2020-02-27 DIAGNOSIS — R339 Retention of urine, unspecified: Secondary | ICD-10-CM | POA: Diagnosis not present

## 2020-02-27 DIAGNOSIS — K219 Gastro-esophageal reflux disease without esophagitis: Secondary | ICD-10-CM | POA: Diagnosis not present

## 2020-02-27 DIAGNOSIS — T83098A Other mechanical complication of other indwelling urethral catheter, initial encounter: Secondary | ICD-10-CM | POA: Diagnosis not present

## 2020-02-27 DIAGNOSIS — E43 Unspecified severe protein-calorie malnutrition: Secondary | ICD-10-CM | POA: Diagnosis not present

## 2020-02-27 DIAGNOSIS — F17210 Nicotine dependence, cigarettes, uncomplicated: Secondary | ICD-10-CM | POA: Diagnosis not present

## 2020-02-27 DIAGNOSIS — Z7982 Long term (current) use of aspirin: Secondary | ICD-10-CM | POA: Diagnosis not present

## 2020-02-27 DIAGNOSIS — N3289 Other specified disorders of bladder: Secondary | ICD-10-CM | POA: Diagnosis not present

## 2020-02-27 DIAGNOSIS — R140 Abdominal distension (gaseous): Secondary | ICD-10-CM | POA: Diagnosis not present

## 2020-02-27 DIAGNOSIS — R9341 Abnormal radiologic findings on diagnostic imaging of renal pelvis, ureter, or bladder: Secondary | ICD-10-CM | POA: Diagnosis not present

## 2020-02-27 DIAGNOSIS — Z7902 Long term (current) use of antithrombotics/antiplatelets: Secondary | ICD-10-CM | POA: Diagnosis not present

## 2020-02-27 DIAGNOSIS — Z882 Allergy status to sulfonamides status: Secondary | ICD-10-CM | POA: Diagnosis not present

## 2020-02-28 DIAGNOSIS — R9341 Abnormal radiologic findings on diagnostic imaging of renal pelvis, ureter, or bladder: Secondary | ICD-10-CM | POA: Diagnosis not present

## 2020-02-28 DIAGNOSIS — T83098A Other mechanical complication of other indwelling urethral catheter, initial encounter: Secondary | ICD-10-CM | POA: Diagnosis not present

## 2020-02-28 DIAGNOSIS — R310 Gross hematuria: Secondary | ICD-10-CM | POA: Diagnosis not present

## 2020-02-28 DIAGNOSIS — R339 Retention of urine, unspecified: Secondary | ICD-10-CM | POA: Diagnosis not present

## 2020-03-01 DIAGNOSIS — R9341 Abnormal radiologic findings on diagnostic imaging of renal pelvis, ureter, or bladder: Secondary | ICD-10-CM | POA: Diagnosis not present

## 2020-03-01 DIAGNOSIS — N200 Calculus of kidney: Secondary | ICD-10-CM | POA: Diagnosis not present

## 2020-03-01 DIAGNOSIS — I714 Abdominal aortic aneurysm, without rupture: Secondary | ICD-10-CM | POA: Diagnosis not present

## 2020-03-08 DIAGNOSIS — R319 Hematuria, unspecified: Secondary | ICD-10-CM | POA: Diagnosis not present

## 2020-03-09 ENCOUNTER — Other Ambulatory Visit: Payer: Self-pay | Admitting: Adult Health

## 2020-03-09 ENCOUNTER — Telehealth: Payer: Self-pay | Admitting: Internal Medicine

## 2020-03-09 NOTE — Telephone Encounter (Signed)
Dr. Saunders Revel This patient had PCI to [facility] 12/03/19. We are asked to hold plavix for resection of bladder tumor. With recent PCI - what is the earliest date he can hold plavix?

## 2020-03-09 NOTE — Telephone Encounter (Signed)
   [facility] Pre-operative Risk Assessment    HEARTCARE STAFF: - Please ensure there is not already an duplicate clearance open for this procedure. - Under Visit Info/Reason for Call, type in Other and utilize the format Clearance MM/DD/YY or Clearance TBD. Do not use dashes or single digits. - If request is for dental extraction, please clarify the # of teeth to be extracted.  Request for surgical clearance:  1. What type of surgery is being performed? Transurethral resection of bladder tumor   2. When is this surgery scheduled? 03/18/20  3. What type of clearance is required (medical clearance vs. Pharmacy clearance to hold med vs. Both)? Both  4. Are there any medications that need to be held prior to surgery and how long? Plavix instructions.  Patient may stay on ASA 81 mg  5. Practice name and name of physician performing surgery? [facility] urology - Dr Montine Circle   6. What is the office phone number? [phone]   7.   What is the office fax number? [phone]  8.   Anesthesia type (None, local, MAC, general) ? Not noted    Clinton Walsh 03/09/2020, 4:07 PM  _________________________________________________________________   (provider comments below)

## 2020-03-10 NOTE — Telephone Encounter (Signed)
Left message to call back [phone] on 03/10/2020.  Patient needs call back.

## 2020-03-10 NOTE — Telephone Encounter (Signed)
Given recent episode of gross hematuria and dx of bladder tumor, I think it is reasonable to hold clopidogrel for 5 days prior to procedure and restart when felt safe to do so by urology, as Mr. Raju is >3 months from his most recent PCI.  Aspirin 81 mg daily should be continued during the perioperative period.  As long as he is not having any active cardiac symptoms, I think it is reasonable for him to proceed as scheduled without additional cardiac testing as this would not further mitigate his perioperative risk.  [name], MD [facility]

## 2020-03-11 NOTE — Telephone Encounter (Signed)
   Primary Cardiologist: [name], MD  Chart reviewed as part of pre-operative protocol coverage. Given past medical history and time since last visit, based on ACC/AHA guidelines, Clinton Walsh would be at acceptable risk for the planned procedure without further cardiovascular testing.   His Plavix may be held for 5 days prior to his procedure.  Please restart Plavix when felt safe to do so by urology.  His aspirin will need to be continued through the procedure.  Patient was advised that if he develops new symptoms prior to surgery to contact our office to arrange a follow-up appointment.  He verbalized understanding.  I will route this recommendation to the requesting party via Epic fax function and remove from pre-op pool.  Please call with questions.  Jossie Ng. Alando Colleran NP-C    03/11/2020, 2:32 PM [facility] [address] [address] Office [phone] Fax [phone]

## 2020-03-11 NOTE — Telephone Encounter (Signed)
Patient returning phone call,  Please advise when able

## 2020-03-16 ENCOUNTER — Other Ambulatory Visit: Payer: Self-pay

## 2020-03-16 ENCOUNTER — Encounter: Payer: Self-pay | Admitting: Internal Medicine

## 2020-03-16 ENCOUNTER — Ambulatory Visit (INDEPENDENT_AMBULATORY_CARE_PROVIDER_SITE_OTHER): Payer: Medicare Other | Admitting: Internal Medicine

## 2020-03-16 VITALS — BP 118/80 | HR 62 | Ht 65.0 in | Wt 118.5 lb

## 2020-03-16 DIAGNOSIS — Z720 Tobacco use: Secondary | ICD-10-CM

## 2020-03-16 DIAGNOSIS — I10 Essential (primary) hypertension: Secondary | ICD-10-CM

## 2020-03-16 DIAGNOSIS — M2550 Pain in unspecified joint: Secondary | ICD-10-CM | POA: Diagnosis not present

## 2020-03-16 DIAGNOSIS — J431 Panlobular emphysema: Secondary | ICD-10-CM | POA: Diagnosis not present

## 2020-03-16 DIAGNOSIS — I714 Abdominal aortic aneurysm, without rupture, unspecified: Secondary | ICD-10-CM

## 2020-03-16 DIAGNOSIS — I712 Thoracic aortic aneurysm, without rupture, unspecified: Secondary | ICD-10-CM

## 2020-03-16 DIAGNOSIS — E785 Hyperlipidemia, unspecified: Secondary | ICD-10-CM | POA: Diagnosis not present

## 2020-03-16 DIAGNOSIS — I2510 Atherosclerotic heart disease of native coronary artery without angina pectoris: Secondary | ICD-10-CM | POA: Diagnosis not present

## 2020-03-16 DIAGNOSIS — R918 Other nonspecific abnormal finding of lung field: Secondary | ICD-10-CM | POA: Diagnosis not present

## 2020-03-16 DIAGNOSIS — I5020 Unspecified systolic (congestive) heart failure: Secondary | ICD-10-CM | POA: Diagnosis not present

## 2020-03-16 NOTE — Patient Instructions (Signed)
Medication Instructions:  Your physician recommends that you continue on your current medications as directed. Please refer to the Current Medication list given to you today.  *If you need a refill on your cardiac medications before your next appointment, please call your pharmacy*  Follow-Up: At [facility], you and your health needs are our priority.  As part of our continuing mission to provide you with exceptional heart care, we have created designated Provider Care Teams.  These Care Teams include your primary Cardiologist (physician) and Advanced Practice Providers (APPs -  Physician Assistants and Nurse Practitioners) who all work together to provide you with the care you need, when you need it.  We recommend signing up for the patient portal called "MyChart".  Sign up information is provided on this After Visit Summary.  MyChart is used to connect with patients for Virtual Visits (Telemedicine).  Patients are able to view lab/test results, encounter notes, upcoming appointments, etc.  Non-urgent messages can be sent to your provider as well.   To learn more about what you can do with MyChart, go to [email or web address].    Your next appointment:   3 month(s)  The format for your next appointment:   In Person  Provider:   You may see [name], MD or one of the following Advanced Practice Providers on your designated Care Team:    Murray Hodgkins, NP  Christell Faith, PA-C  Marrianne Mood, PA-C  Cadence [place], [place]  Laurann Montana, NP
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
## 2020-03-18 ENCOUNTER — Encounter: Payer: Self-pay | Admitting: Internal Medicine

## 2020-03-18 DIAGNOSIS — Z7982 Long term (current) use of aspirin: Secondary | ICD-10-CM | POA: Diagnosis not present

## 2020-03-18 DIAGNOSIS — J431 Panlobular emphysema: Secondary | ICD-10-CM | POA: Diagnosis not present

## 2020-03-18 DIAGNOSIS — I5022 Chronic systolic (congestive) heart failure: Secondary | ICD-10-CM | POA: Diagnosis not present

## 2020-03-18 DIAGNOSIS — I2510 Atherosclerotic heart disease of native coronary artery without angina pectoris: Secondary | ICD-10-CM | POA: Diagnosis not present

## 2020-03-18 DIAGNOSIS — Z882 Allergy status to sulfonamides status: Secondary | ICD-10-CM | POA: Diagnosis not present

## 2020-03-18 DIAGNOSIS — Z981 Arthrodesis status: Secondary | ICD-10-CM | POA: Diagnosis not present

## 2020-03-18 DIAGNOSIS — K219 Gastro-esophageal reflux disease without esophagitis: Secondary | ICD-10-CM | POA: Diagnosis not present

## 2020-03-18 DIAGNOSIS — Z7902 Long term (current) use of antithrombotics/antiplatelets: Secondary | ICD-10-CM | POA: Diagnosis not present

## 2020-03-18 DIAGNOSIS — I252 Old myocardial infarction: Secondary | ICD-10-CM | POA: Diagnosis not present

## 2020-03-18 DIAGNOSIS — Z955 Presence of coronary angioplasty implant and graft: Secondary | ICD-10-CM | POA: Diagnosis not present

## 2020-03-18 DIAGNOSIS — N3289 Other specified disorders of bladder: Secondary | ICD-10-CM | POA: Diagnosis not present

## 2020-03-18 DIAGNOSIS — F17210 Nicotine dependence, cigarettes, uncomplicated: Secondary | ICD-10-CM | POA: Diagnosis not present

## 2020-03-18 DIAGNOSIS — Z79899 Other long term (current) drug therapy: Secondary | ICD-10-CM | POA: Diagnosis not present

## 2020-03-18 DIAGNOSIS — R319 Hematuria, unspecified: Secondary | ICD-10-CM | POA: Diagnosis not present

## 2020-03-18 DIAGNOSIS — I714 Abdominal aortic aneurysm, without rupture: Secondary | ICD-10-CM | POA: Diagnosis not present

## 2020-03-18 DIAGNOSIS — R310 Gross hematuria: Secondary | ICD-10-CM | POA: Diagnosis not present

## 2020-03-18 DIAGNOSIS — I110 Hypertensive heart disease with heart failure: Secondary | ICD-10-CM | POA: Diagnosis not present

## 2020-03-23 DIAGNOSIS — R319 Hematuria, unspecified: Secondary | ICD-10-CM | POA: Diagnosis not present

## 2020-04-19 DIAGNOSIS — Z7902 Long term (current) use of antithrombotics/antiplatelets: Secondary | ICD-10-CM | POA: Diagnosis not present

## 2020-04-19 DIAGNOSIS — R339 Retention of urine, unspecified: Secondary | ICD-10-CM | POA: Diagnosis not present

## 2020-04-19 DIAGNOSIS — Z882 Allergy status to sulfonamides status: Secondary | ICD-10-CM | POA: Diagnosis not present

## 2020-04-19 DIAGNOSIS — I10 Essential (primary) hypertension: Secondary | ICD-10-CM | POA: Diagnosis not present

## 2020-04-19 DIAGNOSIS — Z978 Presence of other specified devices: Secondary | ICD-10-CM | POA: Diagnosis not present

## 2020-04-19 DIAGNOSIS — I2510 Atherosclerotic heart disease of native coronary artery without angina pectoris: Secondary | ICD-10-CM | POA: Diagnosis not present

## 2020-04-19 DIAGNOSIS — F17210 Nicotine dependence, cigarettes, uncomplicated: Secondary | ICD-10-CM | POA: Diagnosis not present

## 2020-04-19 DIAGNOSIS — Z7982 Long term (current) use of aspirin: Secondary | ICD-10-CM | POA: Diagnosis not present

## 2020-04-19 DIAGNOSIS — R319 Hematuria, unspecified: Secondary | ICD-10-CM | POA: Diagnosis not present

## 2020-05-19 DIAGNOSIS — Z466 Encounter for fitting and adjustment of urinary device: Secondary | ICD-10-CM | POA: Diagnosis not present

## 2020-05-19 DIAGNOSIS — R339 Retention of urine, unspecified: Secondary | ICD-10-CM | POA: Diagnosis not present
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
## 2020-06-20 ENCOUNTER — Ambulatory Visit: Payer: Medicare Other | Admitting: Physician Assistant

## 2020-06-24 DIAGNOSIS — R339 Retention of urine, unspecified: Secondary | ICD-10-CM | POA: Diagnosis not present

## 2020-06-24 DIAGNOSIS — Z466 Encounter for fitting and adjustment of urinary device: Secondary | ICD-10-CM | POA: Diagnosis not present

## 2020-07-06 DIAGNOSIS — Z7982 Long term (current) use of aspirin: Secondary | ICD-10-CM | POA: Diagnosis not present

## 2020-07-06 DIAGNOSIS — F17210 Nicotine dependence, cigarettes, uncomplicated: Secondary | ICD-10-CM | POA: Diagnosis not present

## 2020-07-06 DIAGNOSIS — I714 Abdominal aortic aneurysm, without rupture: Secondary | ICD-10-CM | POA: Diagnosis not present

## 2020-07-06 DIAGNOSIS — Z7902 Long term (current) use of antithrombotics/antiplatelets: Secondary | ICD-10-CM | POA: Diagnosis not present

## 2020-07-06 DIAGNOSIS — Z955 Presence of coronary angioplasty implant and graft: Secondary | ICD-10-CM | POA: Diagnosis not present

## 2020-07-06 DIAGNOSIS — I252 Old myocardial infarction: Secondary | ICD-10-CM | POA: Diagnosis not present

## 2020-07-06 DIAGNOSIS — I2510 Atherosclerotic heart disease of native coronary artery without angina pectoris: Secondary | ICD-10-CM | POA: Diagnosis not present

## 2020-07-06 DIAGNOSIS — Z7951 Long term (current) use of inhaled steroids: Secondary | ICD-10-CM | POA: Diagnosis not present

## 2020-07-06 DIAGNOSIS — I110 Hypertensive heart disease with heart failure: Secondary | ICD-10-CM | POA: Diagnosis not present

## 2020-07-06 DIAGNOSIS — L02415 Cutaneous abscess of right lower limb: Secondary | ICD-10-CM | POA: Diagnosis not present

## 2020-07-06 DIAGNOSIS — L539 Erythematous condition, unspecified: Secondary | ICD-10-CM | POA: Diagnosis not present

## 2020-07-06 DIAGNOSIS — L03115 Cellulitis of right lower limb: Secondary | ICD-10-CM | POA: Diagnosis not present

## 2020-07-06 DIAGNOSIS — Z79899 Other long term (current) drug therapy: Secondary | ICD-10-CM | POA: Diagnosis not present

## 2020-07-06 DIAGNOSIS — Z882 Allergy status to sulfonamides status: Secondary | ICD-10-CM | POA: Diagnosis not present

## 2020-07-06 DIAGNOSIS — I5022 Chronic systolic (congestive) heart failure: Secondary | ICD-10-CM | POA: Diagnosis not present

## 2020-07-06 DIAGNOSIS — J431 Panlobular emphysema: Secondary | ICD-10-CM | POA: Diagnosis not present

## 2020-07-06 DIAGNOSIS — M7989 Other specified soft tissue disorders: Secondary | ICD-10-CM | POA: Diagnosis not present
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
## 2020-07-14 ENCOUNTER — Telehealth: Payer: Self-pay | Admitting: Internal Medicine

## 2020-07-14 ENCOUNTER — Ambulatory Visit: Payer: Medicare Other | Admitting: Physician Assistant

## 2020-07-14 MED ORDER — LOSARTAN POTASSIUM 25 MG PO TABS: 25.0000 mg | ORAL_TABLET | Freq: Every day | ORAL | 0 refills | Status: DC

## 2020-07-14 NOTE — Telephone Encounter (Signed)
*  STAT* If patient is at the pharmacy, call can be transferred to refill team.   1. Which medications need to be refilled? (please list name of each medication and dose if known) losartan potassium  2. Which pharmacy/location (including street and city if local pharmacy) is medication to be sent to? [facility] on [address]  3. Do they need a 30 day or 90 day supply? 66  Pt had to rescheduled due to Thurmond Butts being out of office. Pt rescheduled for 7/22

## 2020-07-20 ENCOUNTER — Ambulatory Visit: Payer: Medicare Other | Attending: Internal Medicine

## 2020-07-22 ENCOUNTER — Encounter: Payer: Self-pay | Admitting: Nurse Practitioner

## 2020-07-22 ENCOUNTER — Ambulatory Visit: Payer: Medicare Other | Admitting: Nurse Practitioner
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
## 2020-07-25 ENCOUNTER — Encounter: Payer: Self-pay | Admitting: Nurse Practitioner

## 2020-07-28 DIAGNOSIS — Z466 Encounter for fitting and adjustment of urinary device: Secondary | ICD-10-CM | POA: Diagnosis not present

## 2020-07-28 DIAGNOSIS — R339 Retention of urine, unspecified: Secondary | ICD-10-CM | POA: Diagnosis not present

## 2020-08-29 DIAGNOSIS — Z466 Encounter for fitting and adjustment of urinary device: Secondary | ICD-10-CM | POA: Diagnosis not present

## 2020-08-29 DIAGNOSIS — R339 Retention of urine, unspecified: Secondary | ICD-10-CM | POA: Diagnosis not present

## 2020-09-02 ENCOUNTER — Other Ambulatory Visit: Payer: Self-pay | Admitting: Nurse Practitioner

## 2020-09-02 NOTE — Telephone Encounter (Signed)
Please contact pt for future appointment. ?Pt overdue for 3 month f/u. ?

## 2020-09-02 NOTE — Telephone Encounter (Signed)
LVM to schedule appt

## 2020-09-02 NOTE — Telephone Encounter (Signed)
This is a [place] pt 

## 2020-09-28 DIAGNOSIS — Z466 Encounter for fitting and adjustment of urinary device: Secondary | ICD-10-CM | POA: Diagnosis not present

## 2020-10-03 ENCOUNTER — Other Ambulatory Visit: Payer: Self-pay | Admitting: Nurse Practitioner

## 2020-10-03 NOTE — Telephone Encounter (Signed)
This is a [place] pt 

## 2020-10-03 NOTE — Telephone Encounter (Signed)
Please schedule overdue F/U appointment. Patient did not show for last scheduled office visit. Thank you!

## 2020-10-03 NOTE — Telephone Encounter (Signed)
Attempted to schedule.  LMOV to call office.  ° °

## 2020-10-08 ENCOUNTER — Other Ambulatory Visit: Payer: Self-pay | Admitting: Internal Medicine

## 2020-10-10 NOTE — Telephone Encounter (Signed)
Scheduled

## 2020-10-10 NOTE — Telephone Encounter (Signed)
Please contact pt for future appointment. ?Pt overdue for 3 month f/u. ?

## 2020-10-13 ENCOUNTER — Encounter: Payer: Self-pay | Admitting: Nurse Practitioner

## 2020-10-13 ENCOUNTER — Other Ambulatory Visit: Payer: Self-pay

## 2020-10-13 ENCOUNTER — Ambulatory Visit (INDEPENDENT_AMBULATORY_CARE_PROVIDER_SITE_OTHER): Payer: Medicare Other | Admitting: Nurse Practitioner

## 2020-10-13 VITALS — BP 120/80 | HR 62 | Ht 65.0 in | Wt 114.5 lb

## 2020-10-13 DIAGNOSIS — I2510 Atherosclerotic heart disease of native coronary artery without angina pectoris: Secondary | ICD-10-CM

## 2020-10-13 DIAGNOSIS — Z720 Tobacco use: Secondary | ICD-10-CM

## 2020-10-13 DIAGNOSIS — I255 Ischemic cardiomyopathy: Secondary | ICD-10-CM

## 2020-10-13 DIAGNOSIS — I5020 Unspecified systolic (congestive) heart failure: Secondary | ICD-10-CM | POA: Diagnosis not present

## 2020-10-13 DIAGNOSIS — I10 Essential (primary) hypertension: Secondary | ICD-10-CM | POA: Diagnosis not present

## 2020-10-13 DIAGNOSIS — E785 Hyperlipidemia, unspecified: Secondary | ICD-10-CM

## 2020-10-13 NOTE — Patient Instructions (Signed)
Medication Instructions:  - Your physician recommends that you continue on your current medications as directed. Please refer to the Current Medication list given to you today.   *If you need a refill on your cardiac medications before your next appointment, please call your pharmacy*   Lab Work: - Your physician recommends that you have lab work today: CMET/ Lipid  If you have labs (blood work) drawn today and your tests are completely normal, you will receive your results only by: [place] (if you have [place]) OR A paper copy in the mail If you have any lab test that is abnormal or we need to change your treatment, we will call you to review the results.   Testing/Procedures: - Your physician has requested that you have a limited echocardiogram. Echocardiography is a painless test that uses sound waves to create images of your heart. It provides your doctor with information about the size and shape of your heart and how well your heart's chambers and valves are working. This procedure takes approximately one hour. There are no restrictions for this procedure. There is a possibility that an IV may need to be started during your test to inject an image enhancing agent. This is done to obtain more optimal pictures of your heart. Therefore we ask that you do at least drink some water prior to coming in to hydrate your veins.     Follow-Up: At [facility], you and your health needs are our priority.  As part of our continuing mission to provide you with exceptional heart care, we have created designated Provider Care Teams.  These Care Teams include your primary Cardiologist (physician) and Advanced Practice Providers (APPs -  Physician Assistants and Nurse Practitioners) who all work together to provide you with the care you need, when you need it.  We recommend signing up for the patient portal called "MyChart".  Sign up information is provided on this After Visit Summary.  MyChart  is used to connect with patients for Virtual Visits (Telemedicine).  Patients are able to view lab/test results, encounter notes, upcoming appointments, etc.  Non-urgent messages can be sent to your provider as well.   To learn more about what you can do with MyChart, go to [email or web address].    Your next appointment:   2 month(s)  The format for your next appointment:   In Person  Provider:   [name], MD (only)   Other Instructions  Echocardiogram An echocardiogram is a test that uses sound waves (ultrasound) to produce images of the heart. Images from an echocardiogram can provide important information about: Heart size and shape. The size and thickness and movement of your heart's walls. Heart muscle function and strength. Heart valve function or if you have stenosis. Stenosis is when the heart valves are too narrow. If blood is flowing backward through the heart valves (regurgitation). A tumor or infectious growth around the heart valves. Areas of heart muscle that are not working well because of poor blood flow or injury from a heart attack. Aneurysm detection. An aneurysm is a weak or damaged part of an artery wall. The wall bulges out from the normal force of blood pumping through the body. Tell a health care provider about: Any allergies you have. All medicines you are taking, including vitamins, herbs, eye drops, creams, and over-the-counter medicines. Any blood disorders you have. Any surgeries you have had. Any medical conditions you have. Whether you are pregnant or may be pregnant. What are the risks?  Generally, this is a safe test. However, problems may occur, including an allergic reaction to dye (contrast) that may be used during the test. What happens before the test? No specific preparation is needed. You may eat and drink normally. What happens during the test?  You will take off your clothes from the waist up and put on a hospital  gown. Electrodes or electrocardiogram (ECG)patches may be placed on your chest. The electrodes or patches are then connected to a device that monitors your heart rate and rhythm. You will lie down on a table for an ultrasound exam. A gel will be applied to your chest to help sound waves pass through your skin. A handheld device, called a transducer, will be pressed against your chest and moved over your heart. The transducer produces sound waves that travel to your heart and bounce back (or "echo" back) to the transducer. These sound waves will be captured in real-time and changed into images of your heart that can be viewed on a video monitor. The images will be recorded on a computer and reviewed by your health care provider. You may be asked to change positions or hold your breath for a short time. This makes it easier to get different views or better views of your heart. In some cases, you may receive contrast through an IV in one of your veins. This can improve the quality of the pictures from your heart. The procedure may vary among health care providers and hospitals. What can I expect after the test? You may return to your normal, everyday life, including diet, activities, and medicines, unless your health care provider tells you not to do that. Follow these instructions at home: It is up to you to get the results of your test. Ask your health care provider, or the department that is doing the test, when your results will be ready. Keep all follow-up visits. This is important. Summary An echocardiogram is a test that uses sound waves (ultrasound) to produce images of the heart. Images from an echocardiogram can provide important information about the size and shape of your heart, heart muscle function, heart valve function, and other possible heart problems. You do not need to do anything to prepare before this test. You may eat and drink normally. After the echocardiogram is completed, you  may return to your normal, everyday life, unless your health care provider tells you not to do that. This information is not intended to replace advice given to you by your health care provider. Make sure you discuss any questions you have with your health care provider. Document Revised: 08/11/2019 Document Reviewed: 08/11/2019 Elsevier Patient Education  2022 [employer].
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
## 2020-10-14 ENCOUNTER — Telehealth: Payer: Self-pay

## 2020-10-14 LAB — LIPID PANEL
Chol/HDL Ratio: 3.6 ratio (ref 0.0–5.0)
Cholesterol, Total: 118 mg/dL (ref 100–199)
HDL: 33 mg/dL — ABNORMAL LOW (ref >39)
LDL Chol Calc (NIH): 72 mg/dL (ref 0–99)
Triglycerides: 60 mg/dL (ref 0–149)
VLDL Cholesterol Cal: 13 mg/dL (ref 5–40)

## 2020-10-14 LAB — COMPREHENSIVE METABOLIC PANEL
ALT: 15 IU/L (ref 0–44)
AST: 16 IU/L (ref 0–40)
Albumin/Globulin Ratio: 1.4 (ref 1.2–2.2)
Albumin: 4.3 g/dL (ref 3.8–4.9)
Alkaline Phosphatase: 105 IU/L (ref 44–121)
BUN/Creatinine Ratio: 10 (ref 9–20)
BUN: 10 mg/dL (ref 6–24)
Bilirubin Total: 0.2 mg/dL (ref 0.0–1.2)
CO2: 24 mmol/L (ref 20–29)
Calcium: 9.3 mg/dL (ref 8.7–10.2)
Chloride: 101 mmol/L (ref 96–106)
Creatinine, Ser: 1.03 mg/dL (ref 0.76–1.27)
Globulin, Total: 3 g/dL (ref 1.5–4.5)
Glucose: 80 mg/dL (ref 70–99)
Potassium: 4.4 mmol/L (ref 3.5–5.2)
Sodium: 138 mmol/L (ref 134–144)
Total Protein: 7.3 g/dL (ref 6.0–8.5)
eGFR: 84 mL/min/{1.73_m2} (ref >59)

## 2020-10-14 NOTE — Telephone Encounter (Signed)
Janan Ridge, [place]  10/14/2020 12:59 PM EDT Back to Top    Attempted to contact patient with results.  No answer. LMOV.  Phone Note Started.    Liliane Shi, PA-C  10/14/2020 12:48 PM EDT     Creatinine, K+, LFTs normal.  LDL slightly above goal.  Goal LDL <70. PLAN:  -Continue current medications/treatment plan and follow up as scheduled.  -Work on diet to bring LDL back down to <70. -Repeat fasting lipids in 6 months (order under Angelica Ran, NP) Richardson Dopp, PA-C    10/14/2020 12:44 PM

## 2020-10-15 ENCOUNTER — Other Ambulatory Visit: Payer: Self-pay | Admitting: Internal Medicine

## 2020-10-18 ENCOUNTER — Other Ambulatory Visit: Payer: Self-pay | Admitting: *Deleted

## 2020-10-18 DIAGNOSIS — I2510 Atherosclerotic heart disease of native coronary artery without angina pectoris: Secondary | ICD-10-CM

## 2020-10-18 DIAGNOSIS — E785 Hyperlipidemia, unspecified: Secondary | ICD-10-CM

## 2020-10-19 NOTE — Progress Notes (Signed)
RECALL PLACED AND PRINTED FOR FU

## 2020-10-26 DIAGNOSIS — Z466 Encounter for fitting and adjustment of urinary device: Secondary | ICD-10-CM | POA: Diagnosis not present

## 2020-10-26 DIAGNOSIS — R339 Retention of urine, unspecified: Secondary | ICD-10-CM | POA: Diagnosis not present

## 2020-11-08 DIAGNOSIS — R339 Retention of urine, unspecified: Secondary | ICD-10-CM | POA: Diagnosis not present

## 2020-11-17 ENCOUNTER — Other Ambulatory Visit: Payer: Self-pay

## 2020-11-17 ENCOUNTER — Ambulatory Visit (INDEPENDENT_AMBULATORY_CARE_PROVIDER_SITE_OTHER): Payer: Medicare Other

## 2020-11-17 DIAGNOSIS — I255 Ischemic cardiomyopathy: Secondary | ICD-10-CM

## 2020-11-17 LAB — ECHOCARDIOGRAM COMPLETE
AR max vel: 2.18 cm2
AV Area VTI: 2.07 cm2
AV Area mean vel: 2.15 cm2
AV Mean grad: 2 mmHg
AV Peak grad: 4.7 mmHg
Ao pk vel: 1.08 m/s
Area-P 1/2: 2.54 cm2
Calc EF: 39.5 %
P 1/2 time: 884 msec
S' Lateral: 3.2 cm
Single Plane A2C EF: 30.6 %
Single Plane A4C EF: 47.8 %

## 2020-11-21 ENCOUNTER — Telehealth: Payer: Self-pay | Admitting: *Deleted

## 2020-11-21 NOTE — Telephone Encounter (Signed)
Left voicemail message to call back for review of results.  

## 2020-11-21 NOTE — Telephone Encounter (Signed)
-----   Message from Theora Gianotti, NP sent at 11/18/2020  7:26 PM EST ----- Heart squeezes slightly improved from prior recording, currently 35 to 40%.  The heart is mildly stiff.  The mitral and aortic valves are mildly leaky.  Improvement in heart squeeze is encouraging.  Continue current medical therapy.

## 2020-11-22 NOTE — Telephone Encounter (Signed)
Left voicemail message to call back for review of results and recommendations.  

## 2020-11-22 NOTE — Telephone Encounter (Signed)
Reviewed results with patient and he verbalized understanding with no further questions at this time. 

## 2020-12-06 ENCOUNTER — Other Ambulatory Visit: Payer: Self-pay | Admitting: Physician Assistant

## 2020-12-06 NOTE — Telephone Encounter (Signed)
This is Dr. End pt 

## 2020-12-06 NOTE — Telephone Encounter (Signed)
Please advise if ok to refill Clopidogrel 75 mg tablet. Last filled by Leanor Kail, PA.

## 2020-12-14 ENCOUNTER — Other Ambulatory Visit: Payer: Self-pay

## 2020-12-14 ENCOUNTER — Ambulatory Visit (INDEPENDENT_AMBULATORY_CARE_PROVIDER_SITE_OTHER): Payer: Medicare Other | Admitting: Internal Medicine

## 2020-12-14 ENCOUNTER — Encounter: Payer: Self-pay | Admitting: Internal Medicine

## 2020-12-14 VITALS — BP 126/88 | HR 68 | Ht 65.0 in | Wt 110.0 lb

## 2020-12-14 DIAGNOSIS — I25119 Atherosclerotic heart disease of native coronary artery with unspecified angina pectoris: Secondary | ICD-10-CM | POA: Diagnosis not present

## 2020-12-14 DIAGNOSIS — R918 Other nonspecific abnormal finding of lung field: Secondary | ICD-10-CM | POA: Diagnosis not present

## 2020-12-14 DIAGNOSIS — E785 Hyperlipidemia, unspecified: Secondary | ICD-10-CM

## 2020-12-14 DIAGNOSIS — J439 Emphysema, unspecified: Secondary | ICD-10-CM | POA: Diagnosis not present

## 2020-12-14 DIAGNOSIS — I5022 Chronic systolic (congestive) heart failure: Secondary | ICD-10-CM | POA: Diagnosis not present

## 2020-12-14 DIAGNOSIS — I716 Thoracoabdominal aortic aneurysm, without rupture, unspecified: Secondary | ICD-10-CM

## 2020-12-14 DIAGNOSIS — I7121 Aneurysm of the ascending aorta, without rupture: Secondary | ICD-10-CM | POA: Diagnosis not present

## 2020-12-14 MED ORDER — AMLODIPINE BESYLATE 2.5 MG PO TABS: 2.5000 mg | ORAL_TABLET | Freq: Every day | ORAL | 1 refills | Status: DC

## 2020-12-14 MED ORDER — NITROGLYCERIN 0.4 MG SL SUBL: 0.4000 mg | SUBLINGUAL_TABLET | SUBLINGUAL | 3 refills | Status: DC | PRN
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
## 2020-12-14 NOTE — Patient Instructions (Signed)
Medication Instructions:   Your physician has recommended you make the following change in your medication:   START Amlodipine 2.5 mg daily   *If you need a refill on your cardiac medications before your next appointment, please call your pharmacy*   Lab Work:  None ordered  Testing/Procedures:  Your provider has ordered a chest CT w/ contrast.  You may call [phone] to schedule  Non-Cardiac CT scanning, (CAT scanning), is a noninvasive, special x-ray that produces cross-sectional images of the body using x-rays and a computer. CT scans help physicians diagnose and treat medical conditions. For some CT exams, a contrast material is used to enhance visibility in the area of the body being studied. CT scans provide greater clarity and reveal more details than regular x-ray exams.    Follow-Up: At [facility], you and your health needs are our priority.  As part of our continuing mission to provide you with exceptional heart care, we have created designated Provider Care Teams.  These Care Teams include your primary Cardiologist (physician) and Advanced Practice Providers (APPs -  Physician Assistants and Nurse Practitioners) who all work together to provide you with the care you need, when you need it.  We recommend signing up for the patient portal called "MyChart".  Sign up information is provided on this After Visit Summary.  MyChart is used to connect with patients for Virtual Visits (Telemedicine).  Patients are able to view lab/test results, encounter notes, upcoming appointments, etc.  Non-urgent messages can be sent to your provider as well.   To learn more about what you can do with MyChart, go to [email or web address].    Your next appointment:   1 month(s)  The format for your next appointment:   In Person  Provider:   You may see [name], MD or one of the following Advanced Practice Providers on your designated Care Team:   Murray Hodgkins, NP Christell Faith, PA-C Cadence Kathlen Mody, [place]    Other Instructions  We recommend that you schedule a follow up with pulmonology [phone].

## 2020-12-15 ENCOUNTER — Encounter: Payer: Self-pay | Admitting: Internal Medicine

## 2020-12-16 ENCOUNTER — Telehealth: Payer: Self-pay | Admitting: *Deleted

## 2020-12-16 NOTE — Telephone Encounter (Signed)
Called pt to schedule CT of chest. No answer. Lmtcb.

## 2020-12-20 NOTE — Telephone Encounter (Signed)
DPR on file.  Lmom that the patient will need to call the number for CT scheduling that was provided on the pt 12/12/20 AVS. Patient is to contact our office if any questions.  "Your provider has ordered a chest CT w/ contrast." "You may call [phone] to schedule"

## 2020-12-20 NOTE — Telephone Encounter (Signed)
Patient returning call to schedule

## 2020-12-22 NOTE — Telephone Encounter (Signed)
Reviewed the patient's chart.  His CT chest with contrast has been scheduled for 01/04/21 at 2:30 pm at [facility].

## 2020-12-28 DIAGNOSIS — R339 Retention of urine, unspecified: Secondary | ICD-10-CM | POA: Diagnosis not present

## 2020-12-31 ENCOUNTER — Other Ambulatory Visit: Payer: Self-pay | Admitting: Internal Medicine

## 2021-01-04 ENCOUNTER — Ambulatory Visit
Admission: RE | Admit: 2021-01-04 | Discharge: 2021-01-04 | Disposition: A | Discharge status: Home / Self Care | Payer: Managed Care, Other (non HMO) | Source: Ambulatory Visit | Attending: Internal Medicine | Admitting: Internal Medicine

## 2021-01-04 ENCOUNTER — Other Ambulatory Visit: Payer: Self-pay

## 2021-01-04 DIAGNOSIS — R911 Solitary pulmonary nodule: Secondary | ICD-10-CM | POA: Diagnosis not present

## 2021-01-04 DIAGNOSIS — I700 Atherosclerosis of aorta: Secondary | ICD-10-CM | POA: Diagnosis not present

## 2021-01-04 DIAGNOSIS — I7121 Aneurysm of the ascending aorta, without rupture: Secondary | ICD-10-CM | POA: Diagnosis not present

## 2021-01-04 DIAGNOSIS — J439 Emphysema, unspecified: Secondary | ICD-10-CM | POA: Diagnosis not present

## 2021-01-04 DIAGNOSIS — R918 Other nonspecific abnormal finding of lung field: Secondary | ICD-10-CM

## 2021-01-04 MED ORDER — IOHEXOL 300 MG/ML  SOLN
75.0000 mL | Freq: Once | INTRAMUSCULAR | Status: AC | PRN
  Administered 2021-01-04: 75 mL via INTRAVENOUS

## 2021-01-05 ENCOUNTER — Telehealth: Payer: Self-pay

## 2021-01-05 NOTE — Telephone Encounter (Signed)
[facility] calling report for ct results.

## 2021-01-05 NOTE — Telephone Encounter (Addendum)
Called and spoke with patient. Reviewed the results and recommendations below. Patient proceeded to tell me that his apt. With pulmonary tomorrow was cancelled by their office for an emergency. After our conversation I walked over to pulmonary and Dr. Duwayne Heck reviewed patients CT. She added on an appointment time to see patient tomorrow. Margie CMA, was unable to reach the patient and informed me of this. I will continue to reach out today to patient to inform him of his 9:00 appointment now scheduled with Dr. Duwayne Heck.  Patient did verbalize understanding of the instructions given to him, and was grateful for the follow up, as he stated that he felt something was going wrong on the left side of his chest. He has been SOB consistently, denies fever and chest pain. Patient verbalized that he will also reach out to Dr. Ron Agee office.

## 2021-01-05 NOTE — Telephone Encounter (Signed)
Patient returned call and is aware that he has an appointment with Dr. Duwayne Heck at 0900. Dr. Duwayne Heck advised that he come a little early so she can try and get him out of here in time to make his 1000 apt with Dr. Army Melia. Patient stated that he will be there at 8:45 to check in.

## 2021-01-05 NOTE — Telephone Encounter (Signed)
Called pt left Vm to call back. Pt has an appt tomorrow but pt needs to see his pulmonologist and not Dr. Army Melia. Pt needs to call Dr. Ottie Glazier at [facility] and reschedule his appointment with them.  [facility] nurse may give results to patient if they return call to clinic, a CRM has been created.  KP

## 2021-01-05 NOTE — Telephone Encounter (Signed)
Please let Clinton Walsh know that his chest CT is concerning for a new abnormality in the right lung.  The radiologist feels it is most consistent with pneumonia, though cancer cannot be excluded.  If he is having worsening shortness of breath, chest pain, or fevers, I recommend that he go to the nearest ED for further evaluation.  Otherwise, I recommend that he reach out to his PCP today to discuss need for antibiotics and further testing.  He should also keep his appointment with the lung doctors tomorrow.  [name], MD [facility]

## 2021-01-05 NOTE — Telephone Encounter (Signed)
Received a call from [place] at [facility] radiology to alert our office to the following impression from patients CT of the Chest.  Will route to Dr. Saunders Revel.  IMPRESSION:  1. There is a new, thick-walled cavitary lesion of the dependent right lower lobe measuring approximately 7.2 x 3.6 cm. There is very extensive clustered centrilobular nodularity and heterogeneous airspace consolidation throughout the right middle lobe and right lower lobe. Findings are most suggestive of infection or aspiration, however underlying malignancy is a general differential consideration. Recommend follow-up CT in 3 months following appropriate therapy to assess for stability or resolution. 2. Unchanged prominent mediastinal and bilateral hilar lymph nodes, likely reactive. 3. Unchanged 0.6 cm, somewhat spiculated nodule of the right upper lobe. Although interval stability is reassuring, this is morphologically somewhat suspicious for malignancy. Continued attention on follow-up. 4. Severe emphysema. 5. Coronary artery disease. 6. Unchanged enlargement of the tubular ascending thoracic aorta, measuring up to 4.1 x 4.1 cm. Recommend annual imaging followup by CTA or MRA. This recommendation follows 2010 ACCF/AHA/AATS/ACR/ASA/SCA/SCAI/SIR/STS/SVM Guidelines for the Diagnosis and Management of Patients with Thoracic Aortic Disease. Circulation. 2010; 121: O149-P692. Aortic aneurysm NOS (ICD10-I71.9)

## 2021-01-06 ENCOUNTER — Ambulatory Visit: Payer: Medicare Other | Admitting: Internal Medicine

## 2021-01-06 ENCOUNTER — Other Ambulatory Visit: Payer: Self-pay

## 2021-01-06 ENCOUNTER — Ambulatory Visit: Payer: Medicare Other | Admitting: Adult Health

## 2021-01-06 ENCOUNTER — Encounter: Payer: Self-pay | Admitting: Pulmonary Disease

## 2021-01-06 ENCOUNTER — Ambulatory Visit (INDEPENDENT_AMBULATORY_CARE_PROVIDER_SITE_OTHER): Payer: Medicare Other | Admitting: Pulmonary Disease

## 2021-01-06 VITALS — BP 124/80 | HR 91 | Temp 97.8°F | Ht 65.0 in | Wt 112.0 lb

## 2021-01-06 DIAGNOSIS — R918 Other nonspecific abnormal finding of lung field: Secondary | ICD-10-CM | POA: Diagnosis not present

## 2021-01-06 DIAGNOSIS — F17210 Nicotine dependence, cigarettes, uncomplicated: Secondary | ICD-10-CM | POA: Diagnosis not present

## 2021-01-06 DIAGNOSIS — J449 Chronic obstructive pulmonary disease, unspecified: Secondary | ICD-10-CM

## 2021-01-06 DIAGNOSIS — J850 Gangrene and necrosis of lung: Secondary | ICD-10-CM | POA: Diagnosis not present

## 2021-01-06 MED ORDER — AMOXICILLIN-POT CLAVULANATE 875-125 MG PO TABS: 1.0000 | ORAL_TABLET | Freq: Two times a day (BID) | ORAL | 0 refills | Status: AC

## 2021-01-06 MED ORDER — IPRATROPIUM-ALBUTEROL 0.5-2.5 (3) MG/3ML IN SOLN: 3.0000 mL | Freq: Four times a day (QID) | RESPIRATORY_TRACT | 5 refills | Status: DC | PRN
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
## 2021-01-06 NOTE — Patient Instructions (Signed)
I think that the new findings on your chest CT are due to a very severe form of pneumonia.  He also show some areas of inflammation on the CT.  Going to treat you with an antibiotic called Augmentin (amoxicillin clavulanate) this is going to be twice a day for 14 days.  Make sure that you take the antibiotic with food.  I also recommend that you get a probiotic from over-the-counter to take while you are taking the antibiotic.  This can be something like Culturelle or Florastor.  You can ask the pharmacist to help you choose one.  We sent in request to [employer] for a new nebulizer machine.  Your nebulizer medication has been sent to your pharmacy you can use this nebulizer medication 4 times a day.  If you feel particularly short of breath you can use it up to an extra time a day for no more than 5 times a day.  I do recommend that you stick to a 4 times a day regimen while you are taking the antibiotic to help clear your lungs from the pneumonia.  The medication is called DuoNeb (ipratropium-albuterol) this is a combination of 2 medications.  We are going to check an overnight oxygen level to see how your oxygen level is doing while you sleep.  This will also be arranged through [facility].  For your cough you can get extra strength Mucinex DM 1 tablet twice a day.  Stay well-hydrated.  We will see you in follow-up in 2 to 3 weeks time with either me or the nurse practitioner at that time.  You will have a chest x-ray done on that day so get that done first and then come to your appointment.  You do not need an appointment for the chest x-ray just make sure you come at least 30 to 45 minutes prior to your scheduled appointment to make sure that it gets done.

## 2021-01-09 ENCOUNTER — Other Ambulatory Visit: Payer: Self-pay | Admitting: Internal Medicine

## 2021-01-10 DIAGNOSIS — J449 Chronic obstructive pulmonary disease, unspecified: Secondary | ICD-10-CM | POA: Diagnosis not present

## 2021-01-11 ENCOUNTER — Telehealth: Payer: Self-pay | Admitting: Pulmonary Disease

## 2021-01-11 DIAGNOSIS — J449 Chronic obstructive pulmonary disease, unspecified: Secondary | ICD-10-CM

## 2021-01-11 NOTE — Telephone Encounter (Signed)
ONO reviewed by Dr. Diona Browner continues to qualify for 2L QHS.   Lm for patient.

## 2021-01-12 NOTE — Telephone Encounter (Signed)
Patient is aware of results and voiced his understanding.  He is agreeable with oxygen. Order has been placed.  Nothing further needed at this time.

## 2021-01-13 DIAGNOSIS — J449 Chronic obstructive pulmonary disease, unspecified: Secondary | ICD-10-CM | POA: Diagnosis not present

## 2021-01-15 ENCOUNTER — Other Ambulatory Visit: Payer: Self-pay | Admitting: Internal Medicine

## 2021-01-15 DIAGNOSIS — J441 Chronic obstructive pulmonary disease with (acute) exacerbation: Secondary | ICD-10-CM

## 2021-01-15 MED ORDER — IPRATROPIUM-ALBUTEROL 0.5-2.5 (3) MG/3ML IN SOLN: 3.0000 mL | RESPIRATORY_TRACT | 10 refills | Status: DC | PRN

## 2021-01-15 NOTE — Progress Notes (Signed)
Patient would like refills on DOUNEBS

## 2021-01-20 ENCOUNTER — Other Ambulatory Visit: Payer: Self-pay | Admitting: Internal Medicine

## 2021-01-24 ENCOUNTER — Encounter: Payer: Self-pay | Admitting: Pulmonary Disease

## 2021-01-24 ENCOUNTER — Ambulatory Visit
Admission: RE | Admit: 2021-01-24 | Discharge: 2021-01-24 | Disposition: A | Discharge status: Home / Self Care | Payer: Managed Care, Other (non HMO) | Source: Ambulatory Visit | Attending: Pulmonary Disease | Admitting: Pulmonary Disease

## 2021-01-24 ENCOUNTER — Ambulatory Visit (INDEPENDENT_AMBULATORY_CARE_PROVIDER_SITE_OTHER): Payer: Medicare Other | Admitting: Pulmonary Disease

## 2021-01-24 ENCOUNTER — Other Ambulatory Visit: Payer: Self-pay

## 2021-01-24 VITALS — BP 130/80 | HR 81 | Temp 97.5°F | Ht 65.0 in | Wt 113.0 lb

## 2021-01-24 DIAGNOSIS — J90 Pleural effusion, not elsewhere classified: Secondary | ICD-10-CM | POA: Diagnosis not present

## 2021-01-24 DIAGNOSIS — R0781 Pleurodynia: Secondary | ICD-10-CM | POA: Diagnosis not present

## 2021-01-24 DIAGNOSIS — J439 Emphysema, unspecified: Secondary | ICD-10-CM | POA: Diagnosis not present

## 2021-01-24 DIAGNOSIS — J449 Chronic obstructive pulmonary disease, unspecified: Secondary | ICD-10-CM | POA: Insufficient documentation

## 2021-01-24 DIAGNOSIS — J850 Gangrene and necrosis of lung: Secondary | ICD-10-CM | POA: Diagnosis not present

## 2021-01-24 DIAGNOSIS — F17210 Nicotine dependence, cigarettes, uncomplicated: Secondary | ICD-10-CM

## 2021-01-24 DIAGNOSIS — R0602 Shortness of breath: Secondary | ICD-10-CM | POA: Diagnosis not present

## 2021-01-24 NOTE — Progress Notes (Signed)
Subjective:    Patient ID: Clinton Walsh, male    DOB: 10-Jul-1960, 61 y.o.   MRN: [id number] Chief Complaint  Patient presents with   Follow-up   HPI This is a very complex 61 year old current smoker (5 to 6 cigarettes/day, 45-pack-year history) with a history as noted below followed by Dr. Patricia Pesa since 2018 for abnormal chest CTs and GOLD stage IV COPD.  He was seen on 06 January 2021 as a work in evaluation after an abnormal CT of the chest scan showed a cavitary process on the right lower lobe.  The CT was being performed for follow-up on a thoracic aortic aneurysm.  The patient was noted to have a cavitary process measuring 7.2 cm x 3.6 cm.  The process also associated with airspace consolidation suspicious for potential infectious process/possible aspiration.  After evaluation on the sixth, he was placed on DuoNebs and placed on a 2-week 875 mg twice daily Augmentin regimen.  He just completed Augmentin 4 days ago.  He had a chest x-ray performed today.  He states that since his antibiotic therapy he feels better.  He also notes that the DuoNeb's are helping him with his breathing more so than inhalers ever did.  He unfortunately continues to smoke, currently smoking 5 to 6 cigarettes a day as well as doing vaping intermittently.  Patient overall states that he is feeling better than on his initial visit on the sixth.  He has not had any recent fevers, chills or sweats.  Cough is productive of significant amounts of sputum but this is now clear to white as opposed to previously discolored.  No hemoptysis.  He has some left-sided pleuritic chest pain which comes and goes which is also improving since he first was evaluated.   DATA 10/09/2006 through 01/06/2020 multiple CT chest: Reviewed 09/20/2016 spirometry/DLCO: FEV1 0.99 L or 35% predicted, FVC 3.52 L or 99% predicted FEV1/FVC 28%, no bronchodilator response.  Diffusion capacity severely impaired at 28% however only 1 measurement  obtained and could not obtain lung volumes due to patient's severe shortness of breath. 01/04/2021 CT chest with contrast: Thick-walled cavitary lesion in the dependent right lower lobe measuring 7.2 x 3.6 cm, extensive clustered centrilobular nodularity and heterogeneous airspace consolidation throughout the right middle lobe and right lower lobe suggestive of infection/aspiration however will need follow-up as malignancy cannot be excluded.  0.6 cm spiculated nodule in the right upper lobe unchanged from prior, unchanged left upper lobe findings    Review of Systems A 10 point review of systems was performed and it is as noted above otherwise negative.  Patient Active Problem List   Diagnosis Date Noted   Urinary retention 12/17/2019   Unstable angina ([place]) 12/03/2019   Thoracic aortic aneurysm without rupture 09/17/2019   Essential hypertension 09/17/2019   Chronic respiratory failure with hypoxia ([place]) 06/15/2019   Pneumonia 05/29/2019   Pulmonary emphysema ([place]) 12/01/2018   PVC's (premature ventricular contractions) 11/20/2018   Thoracoabdominal aortic aneurysm (TAAA) 06/19/2018   AAA (abdominal aortic aneurysm) without rupture 06/19/2018   Abdominal pain, epigastric 06/19/2018   TIA (transient ischemic attack) 10/23/2017   Stable angina ([facility]) 07/04/2016   Chronic HFrEF (heart failure with reduced ejection fraction) ([place]) 06/07/2016   Ischemic cardiomyopathy 04/25/2016   Pulmonary nodules 04/25/2016   Abnormal CT of the chest 04/19/2016   Aortic dilatation ([place]) 04/19/2016   STEMI involving right coronary artery ([place]) 04/10/2016   Panlobular emphysema ([place]) 02/02/2015   Gastro-esophageal reflux disease without esophagitis  02/02/2015   Coronary artery disease involving native coronary artery of native heart with angina pectoris ([place]) 02/02/2015   Polyarthralgia 02/02/2015   Tobacco abuse 02/02/2015   Dyspnea 10/16/2011   NSTEMI (non-ST elevated myocardial infarction) ([place])  03/18/2011   Hyperlipidemia LDL goal <70 03/18/2011   History of multiple pulmonary nodules 03/18/2011   Tobacco use disorder 03/18/2011   Bradycardia 03/18/2011   Social History   Tobacco Use   Smoking status: Every Day    Packs/day: 1.50    Years: 30.00    Pack years: 45.00    Types: E-cigarettes, Cigarettes    Last attempt to quit: 12/07/2020    Years since quitting: 0.1   Smokeless tobacco: Never   Tobacco comments:    5-6 cigarettes daily--01/06/2021  Substance Use Topics   Alcohol use: No    Alcohol/week: 0.0 standard drinks    Comment: none x 10y   Allergies  Allergen Reactions   Sulfa Antibiotics Rash   Current Meds  Medication Sig   albuterol (VENTOLIN HFA) 108 (90 Base) MCG/ACT inhaler INHALE 1-2 PUFFS INTO THE LUNGS EVERY 6 (SIX) HOURS AS NEEDED FOR WHEEZING OR SHORTNESS OF BREATH.   amLODipine (NORVASC) 2.5 MG tablet TAKE 1 TABLET BY MOUTH EVERY DAY   aspirin EC 81 MG tablet Take 1 tablet (81 mg total) by mouth daily. Swallow whole.   atorvastatin (LIPITOR) 80 MG tablet TAKE 1 TABLET BY MOUTH EVERY DAY   clopidogrel (PLAVIX) 75 MG tablet TAKE 1 TABLET BY MOUTH EVERY DAY   ipratropium-albuterol (DUONEB) 0.5-2.5 (3) MG/3ML SOLN Take 3 mLs by nebulization every 6 (six) hours as needed. (Patient taking differently: Take 3 mLs by nebulization every 4 (four) hours as needed.)   ipratropium-albuterol (DUONEB) 0.5-2.5 (3) MG/3ML SOLN Take 3 mLs by nebulization every 4 (four) hours as needed.   losartan (COZAAR) 25 MG tablet TAKE 1 TABLET BY MOUTH EVERY DAY   nitroGLYCERIN (NITROSTAT) 0.4 MG SL tablet Place 1 tablet (0.4 mg total) under the tongue every 5 (five) minutes as needed for chest pain.   There is no immunization history for the selected administration types on file for this patient.      Objective:   Physical Exam BP 130/80 (BP Location: Left Arm, Patient Position: Sitting, Cuff Size: Normal)    Pulse 81    Temp (!) 97.5 F (36.4 C) (Oral)    Ht 5\' 5"  (1.651  m)    Wt 113 lb (51.3 kg)    SpO2 96%    BMI 18.80 kg/m  GENERAL: Thin/cachectic gentleman, very depressed affect.  Fully ambulatory, no conversational dyspnea.  Somewhat disheveled, looks much older than stated age. HEAD: Normocephalic, atraumatic.  EYES: Pupils equal, round, reactive to light.  No scleral icterus.  MOUTH: Nose/mouth/throat not examined due to masking requirements for COVID 19. NECK: Supple. No thyromegaly. Trachea midline. No JVD.  No adenopathy. PULMONARY: Very distant breath sounds.  Poor air movement, no adventitious sounds. CARDIOVASCULAR: S1 and S2. Regular rate and rhythm.  Very distant heart tones, no overt murmurs rubs or gallops heard. ABDOMEN: Scaphoid, otherwise benign. MUSCULOSKELETAL: No joint deformity, no clubbing, no edema.  NEUROLOGIC: Psychomotor retardation noted, no focal signs.  No gait disturbance.  Speech is fluent. SKIN: Intact,warm,dry. PSYCH: Flat affect.  Psychomotor retardation.  Chest x-ray performed today: Persistence of airspace disease on the right with no air-fluid levels.     Assessment & Plan:     ICD-10-CM   1. Necrotizing pneumonia ([place])  J85.0 CT  CHEST WO CONTRAST   Continue pulmonary toilet Will repeat chest CT in 2 weeks time    2. Stage 4 very severe COPD by GOLD classification ([place])  J44.9    Continue DuoNebs 4 times a day Patient does not have breath-holding capacity for MDI use    3. Pleuritic chest pain  R07.81    Mild to moderate Recommend extra strength Tylenol Patient on Plavix, discouraged NSAIDs    4. Tobacco dependence due to cigarettes  F17.210    Patient counseled regards to discontinuation of smoking Total counseling time 5 to 8 minutes     Orders Placed This Encounter  Procedures   CT CHEST WO CONTRAST    Standing Status:   Future    Standing Expiration Date:   01/24/2022    Order Specific Question:   Preferred imaging location?    Answer:   [place]   Patient appears improved after  antibiotics.  We will continue to follow cavitary process with CT.  Patient is not the best candidate for invasive procedures due to very advanced COPD.  Hopefully will respond to conservative measures.  We will see the patient in follow-up in 3 to 4 weeks time he is to contact us prior to that time should any new difficulties arise.   Renold Don, MD Advanced Bronchoscopy PCCM [facility]    *This note was dictated using voice recognition software/Dragon.  Despite best efforts to proofread, errors can occur which can change the meaning. Any transcriptional errors that result from this process are unintentional and may not be fully corrected at the time of dictation.

## 2021-01-24 NOTE — Patient Instructions (Signed)
Continue using your nebulizer up to 4 times a day.  We are going to get another scan of the chest.  For the pain that you are having from time to time you can use extra strength Tylenol.  We will see you in follow-up in 3 to 4 weeks time you may see me or the nurse practitioner at that time.

## 2021-01-25 ENCOUNTER — Other Ambulatory Visit: Payer: Self-pay | Admitting: Internal Medicine

## 2021-01-25 ENCOUNTER — Encounter: Payer: Self-pay | Admitting: Pulmonary Disease

## 2021-01-25 DIAGNOSIS — J441 Chronic obstructive pulmonary disease with (acute) exacerbation: Secondary | ICD-10-CM

## 2021-01-26 ENCOUNTER — Telehealth: Payer: Self-pay | Admitting: Pulmonary Disease

## 2021-01-26 DIAGNOSIS — J441 Chronic obstructive pulmonary disease with (acute) exacerbation: Secondary | ICD-10-CM

## 2021-01-26 MED ORDER — IPRATROPIUM-ALBUTEROL 0.5-2.5 (3) MG/3ML IN SOLN: 3.0000 mL | Freq: Four times a day (QID) | RESPIRATORY_TRACT | 10 refills | Status: DC | PRN

## 2021-01-26 NOTE — Telephone Encounter (Signed)
Called and spoke to patient who states he needs a refill on his Albuterol nebs. He states it only last him 7 days since he uses it every 4 hours. We just sent in the RX on 1/16. I explained to him that he would have to pay out of pocket for a refill if desired since its too soon to get a refill now. Patient did not agree with this.      Is there any advice that you would like me to give this patient?

## 2021-01-26 NOTE — Telephone Encounter (Signed)
Rx for Duoneb Q6H has been sent to preferred pharmacy.  Patient is aware and voiced his understanding.  Nothing further needed at this time.

## 2021-01-31 ENCOUNTER — Encounter: Payer: Self-pay | Admitting: Nurse Practitioner

## 2021-01-31 ENCOUNTER — Other Ambulatory Visit: Payer: Self-pay

## 2021-01-31 ENCOUNTER — Ambulatory Visit (INDEPENDENT_AMBULATORY_CARE_PROVIDER_SITE_OTHER): Payer: Managed Care, Other (non HMO) | Admitting: Nurse Practitioner

## 2021-01-31 VITALS — BP 126/88 | HR 68 | Ht 65.0 in | Wt 116.0 lb

## 2021-01-31 DIAGNOSIS — I2510 Atherosclerotic heart disease of native coronary artery without angina pectoris: Secondary | ICD-10-CM

## 2021-01-31 DIAGNOSIS — I5022 Chronic systolic (congestive) heart failure: Secondary | ICD-10-CM

## 2021-01-31 DIAGNOSIS — I10 Essential (primary) hypertension: Secondary | ICD-10-CM

## 2021-01-31 DIAGNOSIS — I7121 Aneurysm of the ascending aorta, without rupture: Secondary | ICD-10-CM

## 2021-01-31 DIAGNOSIS — J449 Chronic obstructive pulmonary disease, unspecified: Secondary | ICD-10-CM

## 2021-01-31 DIAGNOSIS — I7142 Juxtarenal abdominal aortic aneurysm, without rupture: Secondary | ICD-10-CM

## 2021-01-31 DIAGNOSIS — I255 Ischemic cardiomyopathy: Secondary | ICD-10-CM | POA: Diagnosis not present

## 2021-01-31 DIAGNOSIS — J189 Pneumonia, unspecified organism: Secondary | ICD-10-CM

## 2021-01-31 DIAGNOSIS — E785 Hyperlipidemia, unspecified: Secondary | ICD-10-CM

## 2021-01-31 NOTE — Patient Instructions (Signed)
Medication Instructions:  No changes at this time.   *If you need a refill on your cardiac medications before your next appointment, please call your pharmacy*   Lab Work: None  If you have labs (blood work) drawn today and your tests are completely normal, you will receive your results only by: MyChart Message (if you have MyChart) OR A paper copy in the mail If you have any lab test that is abnormal or we need to change your treatment, we will call you to review the results.   Testing/Procedures: None   Follow-Up: At [facility], you and your health needs are our priority.  As part of our continuing mission to provide you with exceptional heart care, we have created designated Provider Care Teams.  These Care Teams include your primary Cardiologist (physician) and Advanced Practice Providers (APPs -  Physician Assistants and Nurse Practitioners) who all work together to provide you with the care you need, when you need it.   Your next appointment:   3 month(s)  The format for your next appointment:   In Person  Provider:   Christopher End, MD or Christopher Berge, NP  
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
## 2021-02-05 ENCOUNTER — Other Ambulatory Visit: Payer: Self-pay | Admitting: Internal Medicine

## 2021-02-07 ENCOUNTER — Ambulatory Visit
Admission: RE | Admit: 2021-02-07 | Discharge: 2021-02-07 | Disposition: A | Discharge status: Home / Self Care | Payer: Managed Care, Other (non HMO) | Source: Ambulatory Visit | Attending: Pulmonary Disease | Admitting: Pulmonary Disease

## 2021-02-07 ENCOUNTER — Other Ambulatory Visit: Payer: Self-pay

## 2021-02-07 DIAGNOSIS — J850 Gangrene and necrosis of lung: Secondary | ICD-10-CM | POA: Insufficient documentation

## 2021-02-10 ENCOUNTER — Other Ambulatory Visit: Payer: Self-pay | Admitting: Pulmonary Disease

## 2021-02-10 DIAGNOSIS — R911 Solitary pulmonary nodule: Secondary | ICD-10-CM

## 2021-02-16 ENCOUNTER — Ambulatory Visit
Admission: RE | Admit: 2021-02-16 | Discharge: 2021-02-16 | Disposition: A | Discharge status: Home / Self Care | Payer: Managed Care, Other (non HMO) | Source: Ambulatory Visit | Attending: Pulmonary Disease | Admitting: Pulmonary Disease

## 2021-02-16 ENCOUNTER — Other Ambulatory Visit: Payer: Self-pay

## 2021-02-16 DIAGNOSIS — I712 Thoracic aortic aneurysm, without rupture, unspecified: Secondary | ICD-10-CM | POA: Insufficient documentation

## 2021-02-16 DIAGNOSIS — J439 Emphysema, unspecified: Secondary | ICD-10-CM | POA: Diagnosis not present

## 2021-02-16 DIAGNOSIS — R911 Solitary pulmonary nodule: Secondary | ICD-10-CM | POA: Diagnosis present

## 2021-02-16 DIAGNOSIS — I700 Atherosclerosis of aorta: Secondary | ICD-10-CM | POA: Diagnosis not present

## 2021-02-16 DIAGNOSIS — I714 Abdominal aortic aneurysm, without rupture, unspecified: Secondary | ICD-10-CM | POA: Insufficient documentation

## 2021-02-16 LAB — GLUCOSE, CAPILLARY: Glucose-Capillary: 95 mg/dL (ref 70–99)

## 2021-02-16 MED ORDER — FLUDEOXYGLUCOSE F - 18 (FDG) INJECTION
6.0000 | Freq: Once | INTRAVENOUS | Status: AC
  Administered 2021-02-16: 6.6 via INTRAVENOUS

## 2021-02-17 ENCOUNTER — Other Ambulatory Visit: Payer: Self-pay | Admitting: Pulmonary Disease

## 2021-02-17 ENCOUNTER — Other Ambulatory Visit: Payer: Self-pay

## 2021-02-17 DIAGNOSIS — R911 Solitary pulmonary nodule: Secondary | ICD-10-CM

## 2021-02-24 ENCOUNTER — Telehealth: Payer: Self-pay | Admitting: Internal Medicine

## 2021-02-24 ENCOUNTER — Encounter: Payer: Self-pay | Admitting: Adult Health

## 2021-02-24 ENCOUNTER — Other Ambulatory Visit: Payer: Self-pay

## 2021-02-24 ENCOUNTER — Ambulatory Visit (INDEPENDENT_AMBULATORY_CARE_PROVIDER_SITE_OTHER): Payer: Managed Care, Other (non HMO) | Admitting: Adult Health

## 2021-02-24 DIAGNOSIS — J449 Chronic obstructive pulmonary disease, unspecified: Secondary | ICD-10-CM

## 2021-02-24 DIAGNOSIS — F17200 Nicotine dependence, unspecified, uncomplicated: Secondary | ICD-10-CM

## 2021-02-24 DIAGNOSIS — J9611 Chronic respiratory failure with hypoxia: Secondary | ICD-10-CM | POA: Diagnosis not present

## 2021-02-24 DIAGNOSIS — J984 Other disorders of lung: Secondary | ICD-10-CM | POA: Diagnosis not present

## 2021-02-24 DIAGNOSIS — M11172 Familial chondrocalcinosis, left ankle and foot: Secondary | ICD-10-CM | POA: Diagnosis not present

## 2021-02-24 DIAGNOSIS — J441 Chronic obstructive pulmonary disease with (acute) exacerbation: Secondary | ICD-10-CM

## 2021-02-24 MED ORDER — IPRATROPIUM-ALBUTEROL 0.5-2.5 (3) MG/3ML IN SOLN: RESPIRATORY_TRACT | 10 refills | Status: DC

## 2021-02-24 NOTE — Patient Instructions (Addendum)
Increase Duoneb Four times a day   Work on not smoking  Continue on Oxygen 2l/m  At bedtime   Activity as tolerated.  Mucinex DM Twice daily  As needed  cough/congestion  Dental follow up when able.  CT guided biopsy as discussed .  Hold Plavix 5 days before biopsy, resume after 24hrs.  Follow up with Dr. Patsey Berthold in 6 weeks and As needed   Please contact office for sooner follow up if symptoms do not improve or worsen or seek emergency care

## 2021-02-24 NOTE — Telephone Encounter (Signed)
Given Mr. Barajas history of multiple stents, I recommend holding clopidogrel for 5 days before the procedure and restarting it as soon it is felt safe to do so by Dr. Patsey Berthold.  If possible, aspirin 81 mg daily should be taken while Clinton Walsh is off clopidogrel.  [name], MD [facility]

## 2021-02-24 NOTE — Assessment & Plan Note (Signed)
Severe COPD w/ ongoing tobacco abuse- smoking cessation discussed  High symptom burden. No perceived benefit from inhaler.  Would increase Duoneb Four times a day    Plan  Patient Instructions  Increase Duoneb Four times a day   Work on not smoking  Continue on Oxygen 2l/m  At bedtime   Activity as tolerated.  Mucinex DM Twice daily  As needed  cough/congestion  Dental follow up when able.  CT guided biopsy as discussed .  Hold Plavix 5 days before biopsy, resume after 24hrs.  Follow up with Dr. Patsey Berthold in 6 weeks and As needed   Please contact office for sooner follow up if symptoms do not improve or worsen or seek emergency care

## 2021-02-24 NOTE — Telephone Encounter (Signed)
Preoperative team, can you verify the fax number for requesting office.  Thank you.  Jossie Ng. Peace Noyes NP-C    02/24/2021, 4:57 PM [place] [address] Office [phone] Fax [phone]

## 2021-02-24 NOTE — Assessment & Plan Note (Signed)
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 Lymphangitic spread.  Will need tissue biopsy for confirmation as highly suspicious in smoker for malignancy .  Infectious process also in the differential . Has completed antibiotics, no significant change in mass in last 4 weeks.  Needs dental care  CT guided biopsy has been ordered.   Plan Patient Instructions  Increase Duoneb Four times a day   Work on not smoking  Continue on Oxygen 2l/m  At bedtime   Activity as tolerated.  Mucinex DM Twice daily  As needed  cough/congestion  Dental follow up when able.  CT guided biopsy as discussed .  Hold Plavix 5 days before biopsy, resume after 24hrs.  Follow up with Dr. Patsey Walsh in 6 weeks and As needed   Please contact office for sooner follow up if symptoms do not improve or worsen or seek emergency care

## 2021-02-24 NOTE — Telephone Encounter (Signed)
Patient in office for visit currently. He is aware of below recommendations and voiced his understanding.

## 2021-02-24 NOTE — Progress Notes (Signed)
@Patient  ID: Clinton Walsh, male    DOB: 09/01/1960, 61 y.o.   MRN: [id number]  Chief Complaint  Patient presents with   Follow-up    Referring provider: Glean Hess, MD  HPI: 61 year old male active smoker followed for Severe COPD and abnormal CT chest with cavitary lung mass Medical history significant for congestive heart failure, coronary artery disease, polymyalgia rheumatica, AAA, previous TIA, ischemic cardiomyopathy  TEST/EVENTS :  10/09/2006 through 01/06/2020 multiple CT chest: Reviewed 09/20/2016 spirometry/DLCO: FEV1 0.99 L or 35% predicted, FVC 3.52 L or 99% predicted FEV1/FVC 28%, no bronchodilator response.  Diffusion capacity severely impaired at 28% however only 1 measurement obtained and could not obtain lung volumes due to patient's severe shortness of breath. 01/04/2021 CT chest with contrast: Thick-walled cavitary lesion in the dependent right lower lobe measuring 7.2 x 3.6 cm, extensive clustered centrilobular nodularity and heterogeneous airspace consolidation throughout the right middle lobe and right lower lobe suggestive of infection/aspiration however will need follow-up as malignancy cannot be excluded.  0.6 cm spiculated nodule in the right upper lobe unchanged from prior, unchanged left upper lobe findings  02/24/2021 Follow up : COPD , O2 RF , cavitary lung mass-PET positive Patient returns for a 1 month follow-up.  Patient is being followed for recent COPD flare with cavitary right lower lobe lung mass.  He was treated for cavitary pneumonia with extended antibiotics with Augmentin.  Follow-up CT chest as below shows persistent lung mass.  PET scan was done which was hypermetabolic.  See results below.  Patient has been set up for a CT-guided lung biopsy. CT chest February 07, 2021 showed severe emphysema unchanged right lower lobe consolidation with decreased cavitation, additional numerous clustered irregular nodules in the right lower lobe and  right middle lobe.  Partially calcified left upper lobe measuring 2 x 2 x 0.8 cm PET scan February 16, 2021 shows a markedly irregular and cavitary area in the right lower lobe with significant FDG uptake maximum SUV at 9.8 measuring 7.4 x 3.4 cm with no significant change since January 04, 2021.  Diffuse nodular opacities in the right lower lobe and right middle lobe with FDG uptake in varying degrees.  Mildly increased metabolic activity in the mediastinal lymph nodes. Large abdominal aortic aneurysm measuring 4.2 x 4.2 cm unchanged since January 06, 2020, concern for lymphangitic tumor.  And a thoracic aortic aneurysm measuring 4 cm. He has been set up for a CT-guided biopsy of the right lower lung mass. Remains on oxygen 2 L at bedtime Since last visit patient says he is feeling He is now taking DuoNeb nebulizer 4 times daily.  Has tried inhalers in the past but been unable to tolerate Patient does continue to smoke.  We discussed smoking cessation No weight loss Gets winded with minimal activity , has daily cough/congestion. Has low activity tolerance.     Allergies  Allergen Reactions   Sulfa Antibiotics Rash    There is no immunization history for the selected administration types on file for this patient.  Past Medical History:  Diagnosis Date   Abdominal aortic aneurysm (AAA)    4.3 cm by CT in 07/2018; b. 01/2020 CT: 4.1x4.1cm juxtarenal AAA. Mod-large mural thrombus.   CAD (coronary artery disease)    a.  2008 Cath: nonobs dzs;  b. 03/2011 NSTEMI: 100 D1 (2.25x18 MiniVision BMS);  c. 04/2011 NSTEMI/PCI: D1 100->thrombectomy/PTCA;  d. 04/2016 Inf STEMI: RCA 33m (3.0x34 Resolute Integrity DES); e. 07/2016 PCI: LAD 60 (FFR 0.78-->2.5x23  Xience Alpine DES); 12/2019 PCI: LM nl, LAD 41m, 83m ISR/70d (2.75x26 Resolute Onyx DESp; 2.25x12 Resolute Onyx DESm), LCX 60d, RCA 25p ISR.   HFrEF (heart failure with reduced ejection fraction) ([place])    a. 04/2016 Echo: EF 30-35%, diff HK, Gr1 DD, triv AI,  mild to mod MR;  b. 07/2016 LV gram: EF 35-40%; c. 11/2017 Echo: EF 40-45%, Gr2 DD; d. 10/2019 Echo: EF 30-35%, glob HK. GrII DD; e. 11/2020 Echo: EF 35-40%, GrI DD. Low-nl RV fxn. Mild MR/AI.   Hyperlipidemia    Ischemic cardiomyopathy    a. 04/2016 Echo: EF 30-35%, diff HK, Gr1 DD;  b. 07/2016 LV gram: EF 35-40%; c. 11/2017 Echo: EF 40-45%; d. 10/2019 Echo: EF 30-35%, grII DD; e. 11/2020 Echo: EF 35-40%, GrI DD.   Mediastinal adenopathy    a. 04/2017 CT chest: stable mediastinal and hilar lymphadenopathy w/ speckled Ca2+. Suspicious for sarcoidosis.   Polymyalgia rheumatica ([place]) 02/2015   Pulmonary nodules    a. nonspecific nodules in the right middle lobe and right lower lobe by CT; b. 04/2017 CT Chest: Stable RML/RLL nodules - followed by pulm; b. 01/2020 CT Chest: 27mm RUL pulm nodule as well as mediastinal and hilar nodes.   Thoracic ascending aortic aneurysm    a. 06/2016 CTA: stable 4 cm TAA; b. 01/2020 CT Chest: 4.2cm TAA; c. 01/2021 CTA Chest: 4.1 x 4.1cm TAA.   TIA (transient ischemic attack)    a. 10/2017 possible TIA - transient R sided wkns; b. 11/2017 Carotid U/S: nl.   Tobacco abuse     Tobacco History: Social History   Tobacco Use  Smoking Status Every Day   Packs/day: 1.50   Years: 30.00   Pack years: 45.00   Types: E-cigarettes, Cigarettes   Last attempt to quit: 12/07/2020   Years since quitting: 0.2  Smokeless Tobacco Never  Tobacco Comments   2-6 cigarettes daily--02/24/2021   Ready to quit: Not Answered Counseling given: Not Answered Tobacco comments: 2-6 cigarettes daily--02/24/2021   Outpatient Medications Prior to Visit  Medication Sig Dispense Refill   amLODipine (NORVASC) 2.5 MG tablet TAKE 1 TABLET BY MOUTH EVERY DAY 30 tablet 2   aspirin EC 81 MG tablet Take 1 tablet (81 mg total) by mouth daily. Swallow whole. 90 tablet 3   atorvastatin (LIPITOR) 80 MG tablet TAKE 1 TABLET BY MOUTH EVERY DAY 90 tablet 1   clopidogrel (PLAVIX) 75 MG tablet TAKE 1 TABLET BY  MOUTH EVERY DAY 90 tablet 0   ipratropium-albuterol (DUONEB) 0.5-2.5 (3) MG/3ML SOLN Take 3 mLs by nebulization every 6 (six) hours as needed. 360 mL 10   losartan (COZAAR) 25 MG tablet TAKE 1 TABLET BY MOUTH EVERY DAY 90 tablet 0   nitroGLYCERIN (NITROSTAT) 0.4 MG SL tablet Place 1 tablet (0.4 mg total) under the tongue every 5 (five) minutes as needed for chest pain. 25 tablet 3   OXYGEN Inhale 2 L into the lungs at bedtime.     No facility-administered medications prior to visit.     Review of Systems:   Constitutional:   No  weight loss, night sweats,  Fevers, chills,  +fatigue, or  lassitude.  HEENT:   No headaches,  Difficulty swallowing,  Tooth/dental problems, or  Sore throat,                No sneezing, itching, ear ache, nasal congestion, post nasal drip,   CV:  No chest pain,  Orthopnea, PND, swelling in lower extremities, anasarca, dizziness, palpitations,  syncope.   GI  No heartburn, indigestion, abdominal pain, nausea, vomiting, diarrhea, change in bowel habits, loss of appetite, bloody stools.   Resp:   No chest wall deformity  Skin: no rash or lesions.  GU: no dysuria, change in color of urine, no urgency or frequency.  No flank pain, no hematuria   MS:  No joint pain or swelling.  No decreased range of motion.  No back pain.    Physical Exam  BP 130/72 (BP Location: Left Arm, Cuff Size: Normal)    Pulse 78    Temp 98 F (36.7 C) (Temporal)    Ht 5\' 5"  (1.651 m)    Wt 116 lb (52.6 kg)    SpO2 95%    BMI 19.30 kg/m   GEN: A/Ox3; pleasant , NAD, thin    HEENT:  [place]/AT,   NOSE-clear, THROAT-clear, no lesions, no postnasal drip or exudate noted. Poor dentiton   NECK:  Supple w/ fair ROM; no JVD; normal carotid impulses w/o bruits; no thyromegaly or nodules palpated; no lymphadenopathy.    RESP  Clear  P & A; w/o, wheezes/ rales/ or rhonchi. no accessory muscle use, no dullness to percussion  CARD:  RRR, no m/r/g, no peripheral edema, pulses intact, no cyanosis  or clubbing.  GI:   Soft & nt; nml bowel sounds; no organomegaly or masses detected.   Musco: Warm bil, no deformities or joint swelling noted.   Neuro: alert, no focal deficits noted.    Skin: Warm, no lesions or rashes, tattoo.     Lab Results:   BMET   BNP No results found for: BNP  ProBNP No results found for: PROBNP  Imaging: CT CHEST WO CONTRAST  Result Date: 02/08/2021 CLINICAL DATA:  Concern for pneumonia EXAM: CT CHEST WITHOUT CONTRAST TECHNIQUE: Multidetector CT imaging of the chest was performed following the standard protocol without IV contrast. RADIATION DOSE REDUCTION: This exam was performed according to the departmental dose-optimization program which includes automated exposure control, adjustment of the mA and/or kV according to patient size and/or use of iterative reconstruction technique. COMPARISON:  Multiple priors, most recent CT chest dated January 04, 2021 FINDINGS: Cardiovascular: Normal heart size. No pericardial effusion. Three-vessel coronary artery calcifications. Atherosclerotic disease of the thoracic aorta. Unchanged dilation of the ascending thoracic aorta measuring up to 4.1 cm. Mediastinum/Nodes: Esophagus and thyroid are unremarkable. Mildly enlarged right hilar lymph nodes are unchanged when compared to prior exam. Reference node measuring 1.1 cm in short axis on series 293. Lungs/Pleura: Central airways are patent. Severe centrilobular emphysema. Right lower lobe consolidation is unchanged in size when compared with prior exam but demonstrates increased cavitation. Additional numerous clustered irregular nodules which are primarily seen in the right lower lobe and right middle lobe not significantly changed when compared with prior exam. Additional previously seen solid pulmonary nodules stable when compared with prior exam dated January 06, 2020. Reference nodule of the right upper lobe measuring 6 mm on series 3, image 63. Partially calcified nodule  of the left upper lobe measuring 2.2 x 0.8 cm on image 34. Upper Abdomen: Partially visualized abdominal aortic aneurysm which appears similar in size when compared with prior CTA of the abdomen and pelvis. Musculoskeletal: No chest wall mass or suspicious bone lesions identified. IMPRESSION: 1. Right lower lobe consolidation is overall unchanged in size but demonstrates decreased cavitation.Additional numerous clustered irregular nodules which are primarily seen in right lower lobe and right middle lobe are not significantly changed. Additional short-term follow-up  in 3 months, alternatively tissue sampling could be performed for further evaluation. 2. Stable right upper lobe pulmonary nodule measuring 6 mm. 3. Coronary artery disease, aortic Atherosclerosis (ICD10-I70.0) and Emphysema (ICD10-J43.9). Electronically Signed   By: Yetta Glassman M.D.   On: 02/08/2021 15:10   NM PET Image Initial (PI) Skull Base To Thigh (F-18 FDG)  Result Date: 02/17/2021 CLINICAL DATA:  Initial treatment strategy for lung nodule with history of recent pneumonia. EXAM: NUCLEAR MEDICINE PET SKULL BASE TO THIGH TECHNIQUE: 6.6 mCi F-18 FDG was injected intravenously. Full-ring PET imaging was performed from the skull base to thigh after the radiotracer. CT data was obtained and used for attenuation correction and anatomic localization. Fasting blood glucose: 95 mg/dl COMPARISON:  Comparison is made with recent chest CTs and with prior PET imaging from 2016. FINDINGS: Mediastinal blood pool activity: SUV max 1.23 Liver activity: SUV max NA NECK: No hypermetabolic lymph nodes in the neck. Incidental CT findings: none CHEST: Markedly irregular and cavitary area in the posterior RIGHT lower lobe shows significant FDG uptake with a maximum SUV of 9.8 (image 124/3) this shows oblong features measuring 7.4 x 3.4 cm and is not changed substantially since January 04, 2021. Nodular area in the medial as ago esophageal recess is contiguous  with this process (image 137/3) maximum SUV 8.6. Diffuse nodular opacities are similar as well with scattered areas of variable FDG uptake throughout the RIGHT lower lobe. RIGHT middle lobe with nodular changes as well with mild uptake in these areas. Substantial uptake passes along the surface of the RIGHT hemidiaphragm from the dominant area in the RIGHT lower lobe. Mildly increased metabolic activity associated with mediastinal lymph nodes, for instance on image 86/3 is an 11-12 mm RIGHT paratracheal lymph node showing a maximum SUV of 2.9, similarly mildly hypermetabolic lymph nodes scattered throughout the RIGHT hilum, subcarinal region and RIGHT paratracheal chain. No frank adenopathy in the chest. Mild low level uptake is present in the LEFT hilum that is not substantially changed from remote PET imaging dating back to 2018 nor is the mild uptake in the mediastinal lymph nodes that is seen currently. Oblong LEFT upper lobe nodule is stable and with only minimal uptake (image 84/3) this measures approximately 2.8 cm greatest axial dimension with a maximum SUV of 1.1. Incidental CT findings: Aortic atherosclerosis. 4 cm ascending thoracic aorta. No signs of pericardial effusion. Three-vessel coronary artery calcification. Normal appearance grossly of central pulmonary vessels. Limited assessment of cardiovascular structures given lack of intravenous contrast. Marked pulmonary emphysema in the background. Multinodular changes in the RIGHT middle lobe and lower lobe with interstitial thickening as discussed. ABDOMEN/PELVIS: No abnormal hypermetabolic activity within the liver, pancreas, adrenal glands, or spleen. No hypermetabolic lymph nodes in the abdomen or pelvis. Incidental CT findings: No acute findings well at of to liver, gallbladder, pancreas, spleen, adrenal glands and kidneys. Limited assessment of gastrointestinal structures due to opacity of intra-abdominal fat. No acute gastrointestinal process  accounting for this. Signs of large abdominal aortic aneurysm measuring 4.2 x 4.2 cm greatest axial dimension as compared to 3.6 cm in 2018. This finding is unchanged compared to the study of January 06, 2020. Foley catheter in the urinary bladder. SKELETON: No focal hypermetabolic activity to suggest skeletal metastasis. Incidental CT findings: none IMPRESSION: Findings that are highly concerning for pneumonic bronchogenic neoplasm in the RIGHT lower lobe. Little change is exhibited over slightly greater than 1 month and there is marked increased metabolic activity the tracks into the medial RIGHT  chest and over the RIGHT hemidiaphragm. Suggest biopsy correlation. Multinodular areas with less hypermetabolic change in the RIGHT middle lobe and in the RIGHT lower lobe along with septal thickening favored to represent areas of pneumonitis though particularly about the area of concern in the RIGHT lower lobe the possibility lymphangitic tumor is considered. Mildly hypermetabolic lymph nodes throughout the mediastinum show no substantial change in size or FDG accumulation since previous imaging and are more likely to be reactive but remain equivocal given findings in the RIGHT lower lobe. Nodule in the LEFT upper lobe is stable and without substantial metabolic activity. Thoracic aortic aneurysm 4 cm. Recommend annual imaging followup by CTA or MRA. This recommendation follows 2010 ACCF/AHA/AATS/ACR/ASA/SCA/SCAI/SIR/STS/SVM Guidelines for the Diagnosis and Management of Patients with Thoracic Aortic Disease. Circulation. 2010; 121: B151-V616. Aortic aneurysm NOS (ICD10-I71.9) 4.2 cm abdominal aortic aneurysm, increased from 2018 but stable compared to recent imaging from January.Recommend follow-up every 12 months and vascular consultation. Reference: J Am Coll Radiol 0737;10:626-948. Aortic Atherosclerosis (ICD10-I70.0) and Emphysema (ICD10-J43.9). Electronically Signed   By: Zetta Bills M.D.   On: 02/17/2021 11:48       No flowsheet data found.  No results found for: NITRICOXIDE      Assessment & Plan:   COPD (chronic obstructive pulmonary disease) (HCC) Severe COPD w/ ongoing tobacco abuse- smoking cessation discussed  High symptom burden. No perceived benefit from inhaler.  Would increase Duoneb Four times a day    Plan  Patient Instructions  Increase Duoneb Four times a day   Work on not smoking  Continue on Oxygen 2l/m  At bedtime   Activity as tolerated.  Mucinex DM Twice daily  As needed  cough/congestion  Dental follow up when able.  CT guided biopsy as discussed .  Hold Plavix 5 days before biopsy, resume after 24hrs.  Follow up with Dr. Patsey Berthold in 6 weeks and As needed   Please contact office for sooner follow up if symptoms do not improve or worsen or seek emergency care        Chronic respiratory failure with hypoxia ([place]) Continue on O2 At bedtime    Tobacco use disorder Smoking cessation discussed   Cavitating mass in right lower lung lobe PET positive RLL cavitary lung mass w/ adenopathy and nodularity in RML/RLL ? Lymphangitic spread.  Will need tissue biopsy for confirmation as highly suspicious in smoker for malignancy .  Infectious process also in the differential . Has completed antibiotics, no significant change in mass in last 4 weeks.  Needs dental care  CT guided biopsy has been ordered.   Plan Patient Instructions  Increase Duoneb Four times a day   Work on not smoking  Continue on Oxygen 2l/m  At bedtime   Activity as tolerated.  Mucinex DM Twice daily  As needed  cough/congestion  Dental follow up when able.  CT guided biopsy as discussed .  Hold Plavix 5 days before biopsy, resume after 24hrs.  Follow up with Dr. Patsey Berthold in 6 weeks and As needed   Please contact office for sooner follow up if symptoms do not improve or worsen or seek emergency care          Rexene Edison, NP 02/24/2021

## 2021-02-24 NOTE — Telephone Encounter (Signed)
I have sent a message to Claudette Head, [place] for [facility], asking for clarification on procedure. See notes form pre op provider requesting formal clearance.  I s/w Margie and have obtained most of the information needed. She advised I call IR at [facility] [phone]. I then s/w IR at [facility] and got a fax # to cardio-pulmonary [phone], ph# [phone].     Pre-operative Risk Assessment    Patient Name: Clinton Walsh  DOB: Feb 11, 1960 MRN: [id number]      Request for Surgical Clearance    Procedure:   RIGHT LOWER LUNG Bx   Date of Surgery:  Clearance TBD                                 Surgeon:  NOT KNOWN AT THIS TIME Surgeon's Group or Practice Name:  [facility]  Phone number:  [phone] Fax number:  [phone]   Type of Clearance Requested:   - Medical  - Pharmacy:  Hold Clopidogrel (Plavix) SEE NOTES FROM DR. END ADDRESSED PLAVIX    Type of Anesthesia:  None  TO BE USED PER REQUESTING OFFICE    Additional requests/questions:    Jiles Prows   02/24/2021, 5:14 PM

## 2021-02-24 NOTE — Assessment & Plan Note (Signed)
Continue on O2 At bedtime

## 2021-02-24 NOTE — Telephone Encounter (Signed)
Preoperative team, please contact requesting office and asked them to submit a formal preoperative cardiac clearance request form.  At that time we will be able to better provide recommendations and return recommendations for proceeding.  Thank you for your help.  Jossie Ng. Antawan Mchugh NP-C    02/24/2021, 1:58 PM [facility] [place] [address] Office [phone] Fax [phone]

## 2021-02-24 NOTE — Telephone Encounter (Signed)
Dr. Patsey Berthold has ordered a CT Biopsy for this patient but according to [facility] with central scheduling he is on plavix and will need to hold for 5 days prior to biopsy.  Can you approve this request

## 2021-02-24 NOTE — Assessment & Plan Note (Signed)
Smoking cessation discussed 

## 2021-02-24 NOTE — Telephone Encounter (Signed)
I will forward this back to pre op provider to please see notes from Dr. Saunders Revel and requesting office. If need still a formal clearance I can try to obtain. Though clearance seems to been addressed by Dr. Saunders Revel?

## 2021-02-24 NOTE — Addendum Note (Signed)
Addended by: Claudette Head A on: 02/24/2021 05:00 PM   Modules accepted: Orders

## 2021-02-24 NOTE — Telephone Encounter (Signed)
Looks like Dr. Saunders Revel has addressed it.  He can be off of Plavix for 5 days.  He can resume Plavix a day after the procedure.

## 2021-02-24 NOTE — Telephone Encounter (Signed)
Routing to Dr. Patsey Berthold.

## 2021-02-25 NOTE — Progress Notes (Signed)
Agree with the details of the visit as noted by Tammy Parrett, NP.  C. Laura Zykira Matlack, MD [facility] 

## 2021-02-27 NOTE — Telephone Encounter (Signed)
° °  Primary Cardiologist: [name], MD  Chart reviewed as part of pre-operative protocol coverage. Given past medical history and time since last visit, based on ACC/AHA guidelines, Clinton Walsh would be at acceptable risk for the planned procedure without further cardiovascular testing.   Per Dr. Saunders Revel "Given Clinton Walsh history of multiple stents, I recommend holding clopidogrel for 5 days before the procedure and restarting it as soon it is felt safe to do so by Dr. Patsey Berthold.  If possible, aspirin 81 mg daily should be taken while Clinton Walsh is off clopidogrel."  I will route this recommendation to the requesting party via [place] fax function and remove from pre-op pool.  Please call with questions.  Jossie Ng. Enolia Koepke NP-C    02/27/2021, 11:06 AM [facility] [place] [address] Office [phone] Fax [phone]

## 2021-02-28 NOTE — Progress Notes (Signed)
Patient on schedule for CT Lung biopsy 03/07/2021, called and spoke with patient on phone with pre procedure instructions given. Made aware to be here @ 0830, NPO after [place] prior to procedure as well as driver post procedure/recovery/discharge. Holding Plavix with LD 03/01/2021, stated understanding.

## 2021-03-06 ENCOUNTER — Other Ambulatory Visit: Payer: Self-pay | Admitting: Radiology

## 2021-03-07 ENCOUNTER — Ambulatory Visit
Admission: RE | Admit: 2021-03-07 | Discharge: 2021-03-07 | Disposition: A | Discharge status: Home / Self Care | Payer: Managed Care, Other (non HMO) | Source: Ambulatory Visit | Attending: Pulmonary Disease | Admitting: Pulmonary Disease

## 2021-03-07 ENCOUNTER — Other Ambulatory Visit: Payer: Self-pay

## 2021-03-07 ENCOUNTER — Ambulatory Visit
Admission: RE | Admit: 2021-03-07 | Discharge: 2021-03-07 | Disposition: A | Discharge status: Home / Self Care | Payer: Managed Care, Other (non HMO) | Source: Ambulatory Visit | Attending: Interventional Radiology | Admitting: Interventional Radiology

## 2021-03-07 DIAGNOSIS — R911 Solitary pulmonary nodule: Secondary | ICD-10-CM | POA: Insufficient documentation

## 2021-03-07 DIAGNOSIS — F17210 Nicotine dependence, cigarettes, uncomplicated: Secondary | ICD-10-CM | POA: Diagnosis not present

## 2021-03-07 DIAGNOSIS — I5022 Chronic systolic (congestive) heart failure: Secondary | ICD-10-CM | POA: Diagnosis not present

## 2021-03-07 DIAGNOSIS — I2510 Atherosclerotic heart disease of native coronary artery without angina pectoris: Secondary | ICD-10-CM | POA: Insufficient documentation

## 2021-03-07 DIAGNOSIS — Z9889 Other specified postprocedural states: Secondary | ICD-10-CM | POA: Insufficient documentation

## 2021-03-07 DIAGNOSIS — Z8673 Personal history of transient ischemic attack (TIA), and cerebral infarction without residual deficits: Secondary | ICD-10-CM | POA: Insufficient documentation

## 2021-03-07 DIAGNOSIS — J449 Chronic obstructive pulmonary disease, unspecified: Secondary | ICD-10-CM | POA: Diagnosis not present

## 2021-03-07 DIAGNOSIS — I714 Abdominal aortic aneurysm, without rupture, unspecified: Secondary | ICD-10-CM | POA: Insufficient documentation

## 2021-03-07 DIAGNOSIS — I255 Ischemic cardiomyopathy: Secondary | ICD-10-CM | POA: Insufficient documentation

## 2021-03-07 LAB — PROTIME-INR
INR: 1 (ref 0.8–1.2)
Prothrombin Time: 13.2 seconds (ref 11.4–15.2)

## 2021-03-07 LAB — CBC
HCT: 43.1 % (ref 39.0–52.0)
Hemoglobin: 14.2 g/dL (ref 13.0–17.0)
MCH: 29.5 pg (ref 26.0–34.0)
MCHC: 32.9 g/dL (ref 30.0–36.0)
MCV: 89.4 fL (ref 80.0–100.0)
Platelets: 366 10*3/uL (ref 150–400)
RBC: 4.82 MIL/uL (ref 4.22–5.81)
RDW: 15.5 % (ref 11.5–15.5)
WBC: 5.5 10*3/uL (ref 4.0–10.5)
nRBC: 0 % (ref 0.0–0.2)

## 2021-03-07 MED ORDER — SODIUM CHLORIDE 0.9 % IV SOLN
INTRAVENOUS | Status: DC
Start: 2021-03-07 — End: 2021-03-08

## 2021-03-07 MED ORDER — FENTANYL CITRATE (PF) 100 MCG/2ML IJ SOLN
INTRAMUSCULAR | Status: AC | PRN
  Administered 2021-03-07: 50 ug via INTRAVENOUS
  Administered 2021-03-07 (×2): 25 ug via INTRAVENOUS

## 2021-03-07 MED ORDER — MIDAZOLAM HCL 2 MG/2ML IJ SOLN
INTRAMUSCULAR | Status: AC | PRN
  Administered 2021-03-07: .5 mg via INTRAVENOUS
  Administered 2021-03-07: 1 mg via INTRAVENOUS
  Administered 2021-03-07: .5 mg via INTRAVENOUS

## 2021-03-07 NOTE — Procedures (Signed)
Pre procedural Dx: Hypermetabolic right lower lobe mass ?Post procedural Dx: Same ? ?Technically successful CT guided biopsy of hypermetabolic right lower lobe mass ?  ?EBL: None.  ?Complications: Tiny, non-enlarging, asymptomatic right basilar PTX.  ? ?Ronny Bacon, MD ?Pager #: [phone] ? ? ?

## 2021-03-07 NOTE — Progress Notes (Signed)
Patient clinically stable post CT lung biopsy RLL per DR Pascal Lux, tolerated well. Vitals stable pre and post procedure. Denies complaints presently. Wife at bedside post procedure with update given per Dr Pascal Lux. Report given to [facility] Rn/specials. Received Versed 2 mg along with Fentanyl 100 mcg IV for procedure.   ?
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
## 2021-03-08 ENCOUNTER — Emergency Department: Payer: Managed Care, Other (non HMO)

## 2021-03-08 ENCOUNTER — Other Ambulatory Visit: Payer: Self-pay

## 2021-03-08 ENCOUNTER — Emergency Department
Admission: EM | Admit: 2021-03-08 | Discharge: 2021-03-08 | Disposition: A | Discharge status: Home / Self Care | Payer: Managed Care, Other (non HMO) | Attending: Emergency Medicine | Admitting: Emergency Medicine

## 2021-03-08 DIAGNOSIS — I5020 Unspecified systolic (congestive) heart failure: Secondary | ICD-10-CM | POA: Insufficient documentation

## 2021-03-08 DIAGNOSIS — J449 Chronic obstructive pulmonary disease, unspecified: Secondary | ICD-10-CM | POA: Diagnosis not present

## 2021-03-08 DIAGNOSIS — R0602 Shortness of breath: Secondary | ICD-10-CM | POA: Diagnosis not present

## 2021-03-08 DIAGNOSIS — R079 Chest pain, unspecified: Secondary | ICD-10-CM | POA: Insufficient documentation

## 2021-03-08 LAB — BASIC METABOLIC PANEL
Anion gap: 7 (ref 5–15)
BUN: 15 mg/dL (ref 6–20)
CO2: 25 mmol/L (ref 22–32)
Calcium: 8.4 mg/dL — ABNORMAL LOW (ref 8.9–10.3)
Chloride: 105 mmol/L (ref 98–111)
Creatinine, Ser: 0.98 mg/dL (ref 0.61–1.24)
GFR, Estimated: >60 mL/min (ref >60)
Glucose, Bld: 122 mg/dL — ABNORMAL HIGH (ref 70–99)
Potassium: 3.9 mmol/L (ref 3.5–5.1)
Sodium: 137 mmol/L (ref 135–145)

## 2021-03-08 LAB — CBC
HCT: 42.8 % (ref 39.0–52.0)
Hemoglobin: 13.7 g/dL (ref 13.0–17.0)
MCH: 29.1 pg (ref 26.0–34.0)
MCHC: 32 g/dL (ref 30.0–36.0)
MCV: 90.9 fL (ref 80.0–100.0)
Platelets: 381 10*3/uL (ref 150–400)
RBC: 4.71 MIL/uL (ref 4.22–5.81)
RDW: 15.6 % — ABNORMAL HIGH (ref 11.5–15.5)
WBC: 7.8 10*3/uL (ref 4.0–10.5)
nRBC: 0 % (ref 0.0–0.2)

## 2021-03-08 LAB — TROPONIN I (HIGH SENSITIVITY)
Troponin I (High Sensitivity): 4 ng/L (ref <18)
Troponin I (High Sensitivity): 3 ng/L (ref <18)

## 2021-03-08 MED ORDER — IOHEXOL 350 MG/ML SOLN
100.0000 mL | Freq: Once | INTRAVENOUS | Status: AC | PRN
  Administered 2021-03-08: 100 mL via INTRAVENOUS
  Filled 2021-03-08: qty 100

## 2021-03-08 NOTE — ED Triage Notes (Addendum)
Pt comes ems from home with CP and epigastric pain for about an hour. Having dizzy spells and hot flashes. NSR/SB. BP 119/73. CBG 101. 20G L AC. 251ml NS given. 324mg  aspirin given. 1/2 inch nitro paste given. Significant heart hx. Had a lung biopsy yesterday.  ?

## 2021-03-08 NOTE — ED Provider Notes (Signed)
[facility] Provider Note    Event Date/Time   First MD Initiated Contact with Patient 03/08/21 1516     (approximate)   History   Chief Complaint Chest Pain   HPI Clinton Walsh is a 61 y.o. male, history of STEMI, hyperlipidemia, panlobular emphysema, GERD, HFrEF, TIA, thoracoabdominal aortic aneurysm, COPD, presents to the emergency department for evaluation of chest pain.  Patient states that he was eating at the table when he felt a sudden onset of chest pain and began sweating profusely.  Patient reports sharp pain in the chest, extending into the upper abdomen and the mid back.  9/10.  Constant sensation.  He presented via EMS, who gave him 324 mg of aspirin and Nitropaste.  Patient reports mild improvement of his symptoms, though still currently has 7/10 pain and feels short of breath.  Denies fever/chills, numbness/tingling in upper or lower extremities, urinary symptoms, diarrhea, rashes, myalgias, or nausea/vomiting  History Limitations: No limitations.      Physical Exam  Triage Vital Signs: ED Triage Vitals  Enc Vitals Group     BP 03/08/21 1449 123/83     Pulse Rate 03/08/21 1449 62     Resp 03/08/21 1449 18     Temp 03/08/21 1449 97.6 F (36.4 C)     Temp Source 03/08/21 1449 Oral     SpO2 03/08/21 1449 100 %     Weight 03/08/21 1447 116 lb 13.5 oz (53 kg)     Height 03/08/21 1447 5\' 5"  (1.651 m)     Head Circumference --      Peak Flow --      Pain Score 03/08/21 1447 7     Pain Loc --      Pain Edu? --      Excl. in [place]? --     Most recent vital signs: Vitals:   03/08/21 1449  BP: 123/83  Pulse: 62  Resp: 18  Temp: 97.6 F (36.4 C)  SpO2: 100%    General: Awake, appears uncomfortable CV: Good peripheral perfusion.  S1-S2 present.  No murmurs rubs or gallops Resp: Normal effort.  Abd: Soft, non-tender. No distention.  Neuro: At baseline. No gross neurological deficits. Other: No chest wall tenderness.    Physical Exam    ED Results / Procedures / Treatments  Labs (all labs ordered are listed, but only abnormal results are displayed) Labs Reviewed  BASIC METABOLIC PANEL - Abnormal; Notable for the following components:      Result Value   Glucose, Bld 122 (*)    Calcium 8.4 (*)    All other components within normal limits  CBC - Abnormal; Notable for the following components:   RDW 15.6 (*)    All other components within normal limits  TROPONIN I (HIGH SENSITIVITY)  TROPONIN I (HIGH SENSITIVITY)     EKG Sinus rhythm, rate of 53, no ST segment changes, left axis deviation noted, no AV blocks, normal QRS interval.   RADIOLOGY  ED Provider Interpretation: I personally reviewed these images.  Chest x-ray shows airspace opacity in right lower lobe.  CT angio shows no evidence of dissection.  DG Chest 2 View  Result Date: 03/08/2021 CLINICAL DATA:  Chest pain EXAM: CHEST - 2 VIEW COMPARISON:  X-ray 03/07/2021, CT 02/07/2021 FINDINGS: Normal heart size. Aortic atherosclerosis. Redemonstration of airspace opacity within the posterior aspect of the right lower lobe, previously seen as cavitary. Areas of nodular density within the left upper lobe. Lung  fields are hyperexpanded and hyperlucent. No new focal airspace consolidation. No pleural effusion or pneumothorax. IMPRESSION: 1. Stable radiographic appearance of the chest including extensive airspace opacity within the posterior aspect of the right lower lobe, previously seen as cavitary. 2. Grossly stable areas of nodular density within the left upper lobe. Electronically Signed   By: Davina Poke D.O.   On: 03/08/2021 15:19   CT Angio Chest/Abd/Pel for Dissection W and/or W/WO  Result Date: 03/08/2021 CLINICAL DATA:  Aortic aneurysm, known or suspected. Chest pain and epigastric pain for 1 hour. Dizzy spells with hot flashes. EXAM: CT ANGIOGRAPHY CHEST, ABDOMEN AND PELVIS TECHNIQUE: Non-contrast CT of the chest was initially  obtained. Multidetector CT imaging through the chest, abdomen and pelvis was performed using the standard protocol during bolus administration of intravenous contrast. Multiplanar reconstructed images and MIPs were obtained and reviewed to evaluate the vascular anatomy. RADIATION DOSE REDUCTION: This exam was performed according to the departmental dose-optimization program which includes automated exposure control, adjustment of the mA and/or kV according to patient size and/or use of iterative reconstruction technique. CONTRAST:  145mL OMNIPAQUE IOHEXOL 350 MG/ML SOLN COMPARISON:  PET-CT 02/16/2021. Chest CT 02/07/2021 and 01/06/2020. Abdominopelvic CTA 01/06/2020. FINDINGS: CTA CHEST FINDINGS Cardiovascular: Pre contrast images demonstrate diffuse atherosclerosis of the aorta, great vessels and coronary arteries. Following contrast, no acute vascular findings are identified. There is no evidence of acute pulmonary embolism. Stable mild dilatation of the ascending aorta to 4.0 cm. The heart size is normal. There is no pericardial effusion. Mediastinum/Nodes: Mildly enlarged right hilar and mediastinal lymph nodes are unchanged. There is no progressive mediastinal adenopathy. There are no enlarged axillary lymph nodes. The thyroid gland, trachea and esophagus demonstrate no significant findings. Lungs/Pleura: There is no pleural effusion or pneumothorax. Severe centrilobular and paraseptal emphysema is again noted. There is persistent volume loss and focal subpleural opacity posteriorly in the right lower lobe measuring up to 7.7 x 4.0 cm on image 103/7. The cavitation seen on the chest CT of last month has nearly completely resolved. Patchy surrounding airspace opacities in the right middle and lower lobes are unchanged. Clustered partially calcified nodules in the left upper lobe and along the minor fissure unchanged. 7 mm spiculated right upper lobe nodule on image 64/7 is stable from the prior CT of 2 months  ago. No enlarging nodules are identified. There are no new airspace opacities. Musculoskeletal/Chest wall: There is no chest wall mass or suspicious osseous finding. Review of the MIP images confirms the above findings. CTA ABDOMEN AND PELVIS FINDINGS VASCULAR Aorta: A partially thrombosed infrarenal abdominal aortic aneurysm measures up to 4.6 x 4.6 cm on image 116/6 (previously 4.5 x 4.4 cm as remeasured). There is associated mural thrombus which appears unchanged. No evidence of aortic dissection or active retroperitoneal bleeding. Celiac: Patent without evidence of aneurysm, dissection, vasculitis or significant stenosis. SMA: Patent without evidence of aneurysm, dissection, vasculitis or significant stenosis. Renals: Both renal arteries are patent without evidence of aneurysm, dissection, vasculitis, fibromuscular dysplasia or significant stenosis. Accessory left renal artery. 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 Inflow: Atherosclerosis, mural thrombus and tortuosity. No aneurysm or dissection. Veins: Suboptimally opacified with contrast, but grossly unremarkable. Review of the MIP images confirms the above findings. NON-VASCULAR Hepatobiliary: No focal liver abnormality is seen. No gallstones, gallbladder wall thickening, or biliary dilatation. Pancreas: Unremarkable. No evidence of pancreatic ductal dilatation or surrounding inflammation. Spleen: Normal in size without focal abnormality. Adrenals/Urinary Tract: Both adrenal glands  appear stable. There is a stable small cyst in the lower pole of the left kidney. No evidence of renal mass, urinary tract calculus or hydronephrosis. A Foley catheter is in place. The bladder is partially decompressed. Stomach/Bowel: No enteric contrast was administered. The stomach appears unremarkable for its degree of distention. No evidence of bowel distension, wall thickening or surrounding inflammation. Lymphatic: No enlarged  abdominopelvic lymph nodes. Reproductive: The prostate gland appears unremarkable. Other: Paucity of intra-abdominal fat. No ascites, free air or focal extraluminal fluid collection. Musculoskeletal: No acute or suspicious osseous findings. Stable mild chronic superior endplate compression deformity at L1. Mild spondylosis. Review of the MIP images confirms the above findings. IMPRESSION: 1. No acute vascular findings are identified in the chest, abdomen or pelvis. 2. Known abdominal aortic aneurysm appears minimally enlarged from previous CTA of 2 months ago, measuring up to 4.6 x 4.6 cm (previously 4.5 x 4.4 cm). Associated mural thrombus is unchanged. Mild dilatation of the thoracic aorta is unchanged (4.0 cm). 3. Similar size of subpleural mass in the right lower lobe which may reflect neoplasm or infection compared with PET-CT of last month. Central cavitation associated with this lesion has decreased. This lesion was biopsied yesterday. 4. Additional pulmonary nodularity is unchanged. Recommend continued CT follow-up. Underlying severe centrilobular emphysema. 5. Stable mildly prominent right hilar and mediastinal lymph nodes. 6. Coronary and aortic atherosclerosis (ICD10-I70.0). Emphysema (ICD10-J43.9). Electronically Signed   By: Richardean Sale M.D.   On: 03/08/2021 17:18    PROCEDURES:  Critical Care performed: None.  Procedures    MEDICATIONS ORDERED IN ED: Medications  iohexol (OMNIPAQUE) 350 MG/ML injection 100 mL (100 mLs Intravenous Contrast Given 03/08/21 1633)     IMPRESSION / MDM / ASSESSMENT AND PLAN / ED COURSE  I reviewed the triage vital signs and the nursing notes.                               Differential diagnosis includes, but is not limited to, ACS, aortic dissection, pulmonary embolism, myocarditis/pericarditis, pneumothorax, GERD, anxiety   ED Course Patient appears uncomfortable, but stable.  Vital signs within normal limits.  CBC shows no evidence of  leukocytosis or anemia.  BMP unremarkable for any electrolyte abnormalities or evidence of kidney injury.  Initial troponin 4.  Second troponin 3.  EKG unremarkable.  Unlikely ACS or myocarditis/pericarditis  Chest x-ray notable for airspace opacity, see above for details.    CT angio chest shows no evidence of aortic dissection, but does show subpleural mass with central cavitation, which was biopsied yesterday.  Other findings listed above.  Upon reexamination, patient states that his chest feels much better than it did before.  Assessment/Plan Given the patient's history, physical exam, and work-up, I do not suspect any serious or life-threatening pathology.  EKG and troponin reassuring for rule out of STEMI or myocarditis/pericarditis.  CT angio reassuring for rule out of aortic dissection.  CT does show findings consistent with subpleural mass with central cavitation, which was biopsied yesterday.  However patient has not had any symptoms concerning for acute infection requiring treatment.  Patient's vitals are within normal limits and patient is currently pain-free.  We will plan to discharge this patient.  Considered admission, but given the patient's presentation, unremarkable work-up, and improvement in symptoms, I do not feel it is necessary.  Encouraged him to follow-up with his PCP, cardiologist, and pulmonologist.  Spoke with Dr. Cheri Fowler, who agreed with the  plan.  Patient was provided with anticipatory guidance, return precautions, and educational material. Encouraged the patient to return to the emergency department at any time if they begin to experience any new or worsening symptoms.       FINAL CLINICAL IMPRESSION(S) / ED DIAGNOSES   Final diagnoses:  Chest pain, unspecified type     Rx / DC Orders   ED Discharge Orders     None        Note:  This document was prepared using Dragon voice recognition software and may include unintentional dictation errors.    Teodoro Spray, [place] 03/08/21 2153    Naaman Plummer, MD 03/09/21 1520

## 2021-03-08 NOTE — Discharge Instructions (Addendum)
-  Follow-up with your primary care provider, cardiologist, and pulmonologist as discussed. ?-Return to the emergency department anytime if you begin to experience any new or worsening symptoms. ?

## 2021-03-09 LAB — SURGICAL PATHOLOGY

## 2021-03-15 ENCOUNTER — Telehealth: Payer: Self-pay | Admitting: Pulmonary Disease

## 2021-03-15 ENCOUNTER — Ambulatory Visit: Payer: Medicare Other | Admitting: Nurse Practitioner

## 2021-03-15 ENCOUNTER — Encounter: Payer: Self-pay | Admitting: Pulmonary Disease

## 2021-03-15 MED ORDER — AZITHROMYCIN 250 MG PO TABS: ORAL_TABLET | ORAL | 1 refills | Status: AC

## 2021-03-15 MED ORDER — PREDNISONE 20 MG PO TABS: 20.0000 mg | ORAL_TABLET | Freq: Every day | ORAL | 1 refills | Status: DC

## 2021-03-15 NOTE — Telephone Encounter (Signed)
Dr. Gonzalez, please advise. Thanks 

## 2021-03-15 NOTE — Telephone Encounter (Signed)
I can discuss further with him on his follow-up visit. ?

## 2021-03-15 NOTE — Telephone Encounter (Signed)
Apologize for the delay, the results were sent to the radiologist and not to me.  I have reviewed the results.  He has a very intense inflammatory response called "organizing pneumonia" he will need steroids and antibiotic for treatment and this will be needed anywhere between 3 to 6 months.  Can start him on 20 mg of prednisone daily and Azithromycin 250 mg q. Monday Wednesday Friday, he has follow-up with me on 6 April and he will need to keep that appointment.  I also urge him to  stop smoking.  ?

## 2021-03-15 NOTE — Telephone Encounter (Signed)
Clinton Pita, MD ?  ?   4:11 PM ?Note ?Apologize for the delay, the results were sent to the radiologist and not to me.  I have reviewed the results.  He has a very intense inflammatory response called "organizing pneumonia" he will need steroids and antibiotic for treatment and this will be needed anywhere between 3 to 6 months.  Can start him on 20 mg of prednisone daily and Azithromycin 250 mg q. Monday Wednesday Friday, he has follow-up with me on 6 April and he will need to keep that appointment.  I also urge him to  stop smoking.   ?  ? ? ?Patient is aware of results and voiced his understanding.  ?Prednisone and azithromycin sent to preferred pharmacy.  ?Nothing further needed.  ? ?

## 2021-03-21 ENCOUNTER — Other Ambulatory Visit: Payer: Self-pay | Admitting: Internal Medicine

## 2021-03-22 ENCOUNTER — Other Ambulatory Visit: Payer: Self-pay

## 2021-03-22 MED ORDER — CLOPIDOGREL BISULFATE 75 MG PO TABS: 75.0000 mg | ORAL_TABLET | Freq: Every day | ORAL | 0 refills | Status: DC

## 2021-03-22 NOTE — Telephone Encounter (Signed)
*  STAT* If patient is at the pharmacy, call can be transferred to refill team.   1. Which medications need to be refilled? (please list name of each medication and dose if known) Plavix, patient ran out yesterday  2. Which pharmacy/location (including street and city if local pharmacy) is medication to be sent to? [facility] [address]  3. Do they need a 30 day or 90 day supply? [place]

## 2021-04-02 ENCOUNTER — Other Ambulatory Visit: Payer: Self-pay | Admitting: Internal Medicine

## 2021-04-03 ENCOUNTER — Other Ambulatory Visit: Payer: Managed Care, Other (non HMO)

## 2021-04-06 ENCOUNTER — Ambulatory Visit (INDEPENDENT_AMBULATORY_CARE_PROVIDER_SITE_OTHER): Payer: Managed Care, Other (non HMO) | Admitting: Pulmonary Disease

## 2021-04-06 ENCOUNTER — Encounter: Payer: Self-pay | Admitting: Pulmonary Disease

## 2021-04-06 VITALS — BP 136/78 | HR 77 | Temp 98.0°F | Ht 65.0 in | Wt 120.0 lb

## 2021-04-06 DIAGNOSIS — F17210 Nicotine dependence, cigarettes, uncomplicated: Secondary | ICD-10-CM | POA: Diagnosis not present

## 2021-04-06 DIAGNOSIS — G4736 Sleep related hypoventilation in conditions classified elsewhere: Secondary | ICD-10-CM

## 2021-04-06 DIAGNOSIS — J449 Chronic obstructive pulmonary disease, unspecified: Secondary | ICD-10-CM

## 2021-04-06 DIAGNOSIS — J439 Emphysema, unspecified: Secondary | ICD-10-CM | POA: Diagnosis not present

## 2021-04-06 DIAGNOSIS — J8489 Other specified interstitial pulmonary diseases: Secondary | ICD-10-CM

## 2021-04-06 DIAGNOSIS — J189 Pneumonia, unspecified organism: Secondary | ICD-10-CM

## 2021-04-06 NOTE — Patient Instructions (Signed)
We are going to get another CT to see how this area in your lung is behaving. ? ?We will continue the prednisone and the antibiotic for now.  Once I see the CT of the chest will determine if we can lower the dose of the prednisone. ? ?We will see you in follow-up in 3 to 4 weeks time with either me or the nurse practitioner at that time. ?

## 2021-04-06 NOTE — Progress Notes (Signed)
Subjective:    Patient ID: Clinton Walsh, male    DOB: 1960/06/23, 61 y.o.   MRN: [id number] Patient Care Team: Pcp, No as PCP - General End, Harrell Gave, MD as PCP - Cardiology (Cardiology) Flora Lipps, MD as Consulting Physician (Pulmonary Disease) End, Harrell Gave, MD as Consulting Physician (Cardiology) Schnier, Dolores Lory, MD (Vascular Surgery)  Chief Complaint  Patient presents with   Follow-up    Sob with exertion, prod cough with clear sputum and wheezing.     HPI This is a very complex 61 year old current smoker (0.5 PPD, 45-pack-year history) with a history as noted below previously followed by Dr. Patricia Pesa since 2018 for abnormal chest CTs and GOLD stage IV COPD.  He was seen on 06 January 2021 as a work-in evaluation after an abnormal CT of the chest scan showed a cavitary process on the right lower lobe.  The CT was being performed for follow-up on a thoracic aortic aneurysm.  The patient was noted to have a cavitary process measuring 7.2 cm x 3.6 cm.  The process also associated with airspace consolidation suspicious for potential infectious process/possible aspiration.  After evaluation on the sixth, he was placed on DuoNebs and placed on a 2-week 875 mg twice daily Augmentin regimen.  Completed the therapy and was seen on follow-up visit by our nurse practitioner on 24 February 2021 in the interim he had a PET/CT on 16 February which showed increased uptake on the cavitary process on the right lower lobe.  He underwent a CT-guided biopsy on 7 March and this was consistent with necrotizing pneumonia and granulomatous inflammation likely organizing pneumonia.  He was started on prednisone daily and azithromycin 3 times a week for organizing pneumonia management.  He unfortunately continues to smoke, currently smoking half a pack of cigarettes a day as well as doing vaping intermittently.   Patient overall states that he is feeling better than on his initial visit.  He has  not had any recent fevers, chills or sweats.  Cough is productive of significant amounts of sputum but this is now clear to white as opposed to previously discolored.  No hemoptysis.  His pleuritic chest pain has resolved.  Previously he has been noted to require nocturnal oxygen.  He has been compliant with this.  DATA 10/09/2006 through 01/06/2020 multiple CT chest: Reviewed 09/20/2016 spirometry/DLCO: FEV1 0.99 L or 35% predicted, FVC 3.52 L or 99% predicted FEV1/FVC 28%, no bronchodilator response.  Diffusion capacity severely impaired at 28% however only 1 measurement obtained and could not obtain lung volumes due to patient's severe shortness of breath. 01/04/2021 CT chest with contrast: Thick-walled cavitary lesion in the dependent right lower lobe measuring 7.2 x 3.6 cm, extensive clustered centrilobular nodularity and heterogeneous airspace consolidation throughout the right middle lobe and right lower lobe suggestive of infection/aspiration however will need follow-up as malignancy cannot be excluded.  0.6 cm spiculated nodule in the right upper lobe unchanged from prior, unchanged left upper lobe findings 02/16/2021 PET/CT: Irregular and cavitary area of posterior right lower lobe significant FDG uptake maximum SUV 9.8, biopsy suggested, other areas of less hypermetabolic change in the right middle lobe consistent with pneumonitis, stable left upper lobe nodule without substantial metabolic activity. 03/07/2021 CT-guided core needle biopsy: LUNG, RIGHT LOWER LOBE; CT-GUIDED CORE NEEDLE BIOPSY, SUSPICIOUS FOR MALIGNANCY. FOCAL PNEUMOCYTE ATYPIA, IN A BACKGROUND OF ORGANIZING PNEUMONIA AND NON-NECROTIZING GRANULOMATOUS INFLAMMATION  Review of Systems A 10 point review of systems was performed and it is as noted  above otherwise negative.   Patient Active Problem List   Diagnosis Date Noted   Muscle cramps 05/12/2021   Dental caries 05/12/2021   Infrarenal abdominal aortic aneurysm (AAA)  without rupture ([place]) 05/04/2021   COPD (chronic obstructive pulmonary disease) ([place]) 02/24/2021   Cavitating mass in right lower lung lobe 02/24/2021   Urinary retention 12/17/2019   Unstable angina ([place]) 12/03/2019   Thoracic aortic aneurysm without rupture ([place]) 09/17/2019   Essential hypertension 09/17/2019   Chronic respiratory failure with hypoxia ([place]) 06/15/2019   Pneumonia 05/29/2019   Pulmonary emphysema ([place]) 12/01/2018   PVC's (premature ventricular contractions) 11/20/2018   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[place]) 06/19/2018   AAA (abdominal aortic aneurysm) without rupture (HCC) 06/19/2018   Abdominal pain, epigastric 06/19/2018   TIA (transient ischemic attack) 10/23/2017   Stable angina (HCC) 07/04/2016   Chronic HFrEF (heart failure with reduced ejection fraction) ([place]) 06/07/2016   Ischemic cardiomyopathy 04/25/2016   Pulmonary nodules 04/25/2016   Abnormal CT of the chest 04/19/2016   Aortic dilatation ([place]) 04/19/2016   STEMI involving right coronary artery ([place]) 04/10/2016   Panlobular emphysema ([place]) 02/02/2015   Gastro-esophageal reflux disease without esophagitis 02/02/2015   Coronary artery disease of native artery of native heart with stable angina pectoris ([place]) 02/02/2015   Polyarthralgia 02/02/2015   Tobacco abuse 02/02/2015   Dyspnea 10/16/2011   NSTEMI (non-ST elevated myocardial infarction) ([place]) 03/18/2011   Hyperlipidemia LDL goal <70 03/18/2011   History of multiple pulmonary nodules 03/18/2011   Tobacco use disorder 03/18/2011   Bradycardia 03/18/2011   Social History   Tobacco Use   Smoking status: Every Day    Packs/day: 1.50    Years: 30.00    Total pack years: 45.00    Types: E-cigarettes, Cigarettes   Smokeless tobacco: Never   Tobacco comments:    0.5PPD   Substance Use Topics   Alcohol use: No    Alcohol/week: 0.0 standard drinks of alcohol    Comment: none x 10y   Allergies  Allergen Reactions   Sulfa Antibiotics  Rash   Medications have been reviewed and are as noted.  There is no immunization history for the selected administration types on file for this patient.    Objective:   Physical Exam BP 136/78 (BP Location: Left Arm, Cuff Size: Normal)   Pulse 77   Temp 98 F (36.7 C) (Temporal)   Ht 5\' 5"  (1.651 m)   Wt 120 lb (54.4 kg)   SpO2 96%   BMI 19.97 kg/m  GENERAL: Thin/cachectic gentleman, very depressed/flat affect.  Fully ambulatory, no conversational dyspnea. Disheveled, looks much older than stated age. HEAD: Normocephalic, atraumatic.  EYES: Pupils equal, round, reactive to light.  No scleral icterus.  MOUTH: Terrible dentition.  No thrush NECK: Supple. No thyromegaly. Trachea midline. No JVD.  No adenopathy. PULMONARY: Very distant breath sounds.  Poor air movement, no adventitious sounds. CARDIOVASCULAR: S1 and S2. Regular rate and rhythm.  Very distant heart tones, no overt murmurs rubs or gallops heard. ABDOMEN: Scaphoid, otherwise benign. MUSCULOSKELETAL: No joint deformity, no clubbing, no edema.  NEUROLOGIC: Psychomotor retardation noted, no focal signs.  No gait disturbance.  Speech is fluent. SKIN: Intact,warm,dry. PSYCH: Flat affect.  Psychomotor retardation.     Assessment & Plan:     ICD-10-CM   1. Organizing pneumonia [facility])  J84.89 CT CHEST WO CONTRAST   Biopsy on 07 March 2021 necrotizing pneumonia with granulomatous inflammation Prednisone/azithromycin, continue We will obtain CT chest to monitor progress  2. Stage 4 very severe COPD by GOLD classification ([place])  J44.9    Patient is quite end-stage Advised to quit smoking Continue nebulization treatments    3. Nocturnal hypoxemia due to emphysema (HCC)  J43.9    G47.36    On 2 L/min nasal cannula O2, continue nocturnally Patient compliant with therapy Notes improvement on symptoms    4. Tobacco dependence due to cigarettes  F17.210    Patient counseled regards to discontinuation of smoking Patient  is not motivated to quit     Orders Placed This Encounter  Procedures   CT CHEST WO CONTRAST    2-3wk    Standing Status:   Future    Number of Occurrences:   1    Standing Expiration Date:   04/07/2022    Order Specific Question:   Preferred imaging location?    Answer:   [place]   Patient is to continue management for organizing pneumonia with prednisone and azithromycin.  We will repeat CT chest.  Follow-up in 3 to 4 weeks time.  Renold Don, MD Advanced Bronchoscopy PCCM [facility]    *This note was dictated using voice recognition software/Dragon.  Despite best efforts to proofread, errors can occur which can change the meaning. Any transcriptional errors that result from this process are unintentional and may not be fully corrected at the time of dictation.

## 2021-04-12 ENCOUNTER — Telehealth: Payer: Self-pay | Admitting: Internal Medicine

## 2021-04-12 NOTE — Telephone Encounter (Signed)
L msg for patient to schedule labwork before 5/3 appointment. Patient no showed lab appt on 4/2

## 2021-04-18 ENCOUNTER — Other Ambulatory Visit (INDEPENDENT_AMBULATORY_CARE_PROVIDER_SITE_OTHER): Payer: Managed Care, Other (non HMO)

## 2021-04-18 DIAGNOSIS — E785 Hyperlipidemia, unspecified: Secondary | ICD-10-CM

## 2021-04-18 DIAGNOSIS — I2510 Atherosclerotic heart disease of native coronary artery without angina pectoris: Secondary | ICD-10-CM | POA: Diagnosis not present

## 2021-04-19 LAB — LIPID PANEL
Chol/HDL Ratio: 2.3 ratio (ref 0.0–5.0)
Cholesterol, Total: 120 mg/dL (ref 100–199)
HDL: 53 mg/dL (ref >39)
LDL Chol Calc (NIH): 48 mg/dL (ref 0–99)
Triglycerides: 105 mg/dL (ref 0–149)
VLDL Cholesterol Cal: 19 mg/dL (ref 5–40)

## 2021-04-24 ENCOUNTER — Other Ambulatory Visit: Payer: Managed Care, Other (non HMO)

## 2021-05-03 ENCOUNTER — Telehealth: Payer: Self-pay | Admitting: Internal Medicine

## 2021-05-03 ENCOUNTER — Encounter: Payer: Self-pay | Admitting: Internal Medicine

## 2021-05-03 ENCOUNTER — Telehealth (INDEPENDENT_AMBULATORY_CARE_PROVIDER_SITE_OTHER): Payer: Managed Care, Other (non HMO) | Admitting: Internal Medicine

## 2021-05-03 VITALS — BP 137/105 | HR 95 | Ht 65.0 in | Wt 115.0 lb

## 2021-05-03 DIAGNOSIS — J9611 Chronic respiratory failure with hypoxia: Secondary | ICD-10-CM | POA: Diagnosis not present

## 2021-05-03 DIAGNOSIS — I5022 Chronic systolic (congestive) heart failure: Secondary | ICD-10-CM | POA: Diagnosis not present

## 2021-05-03 DIAGNOSIS — I7143 Infrarenal abdominal aortic aneurysm, without rupture: Secondary | ICD-10-CM

## 2021-05-03 DIAGNOSIS — I25118 Atherosclerotic heart disease of native coronary artery with other forms of angina pectoris: Secondary | ICD-10-CM
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
## 2021-05-03 NOTE — Patient Instructions (Signed)
Medication Instructions:  ? ?Your physician recommends that you continue on your current medications as directed. Please refer to the Current Medication list given to you today. ? ?*If you need a refill on your cardiac medications before your next appointment, please call your pharmacy* ? ? ?Lab Work: ? ?None ordered ? ?Testing/Procedures: ? ?None ordered ? ? ?Follow-Up: ?At [facility], you and your health needs are our priority.  As part of our continuing mission to provide you with exceptional heart care, we have created designated Provider Care Teams.  These Care Teams include your primary Cardiologist (physician) and Advanced Practice Providers (APPs -  Physician Assistants and Nurse Practitioners) who all work together to provide you with the care you need, when you need it. ? ?We recommend signing up for the patient portal called "MyChart".  Sign up information is provided on this After Visit Summary.  MyChart is used to connect with patients for Virtual Visits (Telemedicine).  Patients are able to view lab/test results, encounter notes, upcoming appointments, etc.  Non-urgent messages can be sent to your provider as well.   ?To learn more about what you can do with MyChart, go to [email or web address].   ? ?Your next appointment:   ? ?1) We have cancelled your ultrasound that was scheduled tomorrow 05/04/21 ? ?2) 3 month(s) ? ?The format for your next appointment:   ?In Person ? ?Provider:   ?You may see [name], MD or one of the following Advanced Practice Providers on your designated Care Team:   ?Murray Hodgkins, NP ?Christell Faith, PA-C ?Cadence Kathlen Mody, PA-C{ ? ? ?Important Information About Sugar ? ? ? ? ? ? ?

## 2021-05-03 NOTE — Telephone Encounter (Signed)
Secure chat received and sent to Dr. Saunders Revel as well.  ?Per Dr. Saunders Revel, ok to change pt to a virtual visit today.  ?Pt has been notified and appt updated.  ?

## 2021-05-03 NOTE — Telephone Encounter (Signed)
Patient is calling wanting to know if his appt can be made virtual today instead of in office due to having a stomach bug. ?

## 2021-05-04 ENCOUNTER — Other Ambulatory Visit: Payer: Managed Care, Other (non HMO)

## 2021-05-04 ENCOUNTER — Encounter: Payer: Self-pay | Admitting: Internal Medicine

## 2021-05-04 DIAGNOSIS — I7143 Infrarenal abdominal aortic aneurysm, without rupture: Secondary | ICD-10-CM | POA: Insufficient documentation

## 2021-05-05 ENCOUNTER — Telehealth: Payer: Self-pay | Admitting: Pulmonary Disease

## 2021-05-05 IMAGING — CT CT ANGIO CHEST
1 of 7 series · 1 of 46 positions shown · IV contrast (omnipaque)
Comparison: Chest CT-05/19/2019;
COMPARISON: Chest CT-05/19/2019;

Addendum:
CLINICAL DATA: Evaluate thoracic and abdominal aortic aneurysms.

EXAM:
CT ANGIOGRAPHY CHEST, ABDOMEN AND PELVIS
TECHNIQUE: Multidetector CT imaging through the chest, abdomen and pelvis was
performed using the standard protocol during bolus administration of
intravenous contrast. Multiplanar reconstructed images and MIPs were
obtained and reviewed to evaluate the vascular anatomy.
CONTRAST:  100mL OMNIPAQUE IOHEXOL 350 MG/ML SOLN

[Series 4: axial arterial · axial · arterial · 0.65mm/px · 1 of 304 slices shown]
[im 160/304  lung]
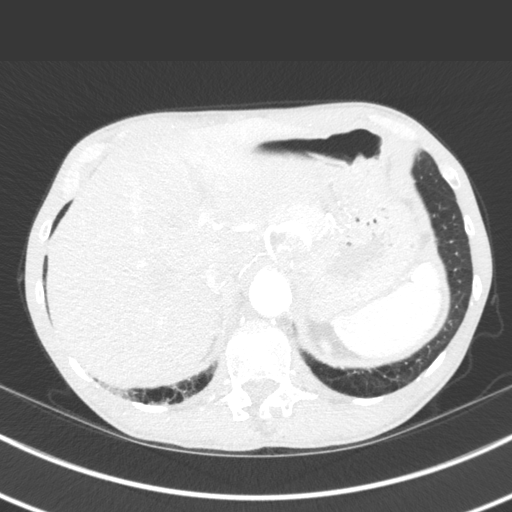

[1 of 46 positions shown; findings below may reference images not displayed]

11/18/2018; 05/22/2018;
04/03/2017; 10/16/2016; 06/28/2016; 10/11/2006

CT chest, abdomen pelvis-04/10/2016

Abdominal CT-07/14/2018

PET-CT-04/26/2016
FINDINGS: CTA CHEST FINDINGS

Vascular Findings:

Redemonstrated mild fusiform ectasia of the ascending thoracic aorta
with measurements as follows. The thoracic aorta tapers to a normal
caliber at the level of the aortic arch.

Scattered mixed calcified and noncalcified atherosclerotic plaque
involving the aortic arch and descending thoracic aorta, not
resulting in a hemodynamically significant stenosis. No evidence of
thoracic aortic dissection or periaortic stranding on this non gated
examination.

Conventional configuration of the aortic arch. The branch vessels of
the aortic arch appear patent throughout their imaged courses.

Cardiomegaly. Coronary artery calcifications. No pericardial
effusion.

Although this examination was not tailored for the evaluation the
pulmonary arteries, there are no discrete filling defects within the
central pulmonary arterial tree to suggest central pulmonary
embolism. Normal caliber of the main pulmonary artery.

-------------------------------------------------------------

Thoracic aortic measurements:

Sinotubular junction

32 mm as measured in greatest oblique short axis coronal dimension.

Proximal ascending aorta

42 mm as measured in greatest oblique short axis axial dimension at
the level of the main pulmonary artery (axial image 80, series 4)
and approximately 41 mm in greatest oblique short axis coronal
diameter (coronal image 47, series 7), grossly unchanged since the
4738 examination though slightly increased in size compared to
remote examination performed in 7117, previously, 40 mm.

Aortic arch aorta

29 mm as measured in greatest oblique short axis sagittal dimension.

Proximal descending thoracic aorta

28 mm as measured in greatest oblique short axis axial dimension at
the level of the main pulmonary artery.

Distal descending thoracic aorta

28 mm as measured in greatest oblique short axis axial dimension at
the level of the diaphragmatic hiatus.

Review of the MIP images confirms the above findings.

-------------------------------------------------------------

Non-Vascular Findings:

Mediastinum/Lymph Nodes: Majority of recent prior examinations have
been performed without contrast however the present contrast
examination demonstrates development of mediastinal and hilar
lymphadenopathy with index pretracheal lymph node measuring 1.2 cm
in greatest short axis diameter (image 48, series 4), index AP
window lymph node measuring 1.2 cm (image 65,), index right
suprahilar nodal conglomeration measuring approximately 1.3 cm
(image 79) and index left infrahilar lymph node measuring
approximately 1 cm (image 86), slightly progressed compared to
remote chest CT performed [DATE]. No axillary lymphadenopathy.

Lungs/Pleura: Redemonstrated severe predominantly centrilobular
emphysematous change.

Improved aeration of both the anterior basilar aspect of the left
lower lobe as well as the subpleural aspect of the right lower with
minimal residual right basilar subsegmental atelectasis.
Subsegmental atelectasis also seen involving the caudal aspect of
the right middle lobe.

Perifissural lymph nodes along the left fissure are unchanged since
at least the 4738 examination (images 126 and 132, series 6).

The approximately 0.6 cm subpleural right lower lobe pulmonary
nodule (image 128, series 6), is unchanged since at least the 4738
examination and thus of benign etiology.

The oblong linear at least 3.9 x 0.9 cm opacity within the left
upper lobe (representative coronal image 53, series 7), is unchanged
since at least the [DATE] examination and thus favored to represent
an area of scarring.

Interval development of an approximately 0.6 cm somewhat spiculated
opacity within the right upper lobe (image 63, series 6

Musculoskeletal: No acute or aggressive osseous abnormalities within
the chest. Regional soft tissues appear normal. Mild heterogeneity
of the thyroid without discrete nodule.

_________________________________________________________

_________________________________________________________

CTA ABDOMEN AND PELVIS FINDINGS

VASCULAR

Aorta: Juxtarenal abdominal aortic aneurysm measuring approximately
4.1 x 4.1 cm (axial image 181, series 4; coronal image 61, series
7), increased in size compared to the [DATE] examination,
previously, 3.8 x 3.6 cm.

The aneurysm is noted to arise immediately caudal to the take-off of
the bilateral renal arteries and extends to the level of the aortic
bifurcation without extension to involve either of the common iliac
arteries.

There is a moderate to large amount of crescentic predominantly
noncalcified mural thrombus within the dominant component of the
aneurysm, progressed in the interval. No evidence of abdominal
aortic dissection or definitive evidence of periaortic stranding.

Celiac: There is a minimal amount of eccentric noncalcified
atherosclerotic plaque involving the origin of the celiac artery,
not resulting in a hemodynamically significant stenosis.
Conventional branching pattern.

SMA: Widely patent without hemodynamically significant narrowing.
Conventional branching pattern. The distal tributaries of the SMA
appear widely patent without discrete lumen filling defect to
suggest distal embolism.

Renals: Duplicated left renal arteries, nearly codominant; there is
a minimal amount of mixed calcified and noncalcified atherosclerotic
plaque involving both left-sided duplicated nearly could dominant
renal arteries though not definitely resulting in hemodynamically
significant stenosis. The solitary right renal artery is widely
patent without hemodynamically significant narrowing. No discrete
areas of vessel irregularity to suggest FMD.

IMA: Appears occluded at its origin with early reconstitution via
collateral supply from the SMA. Additionally, there appears to be a
hypertrophied median sacral artery.

Inflow: There is a moderate amount of eccentric mixed calcified and
noncalcified atherosclerotic plaque involving the bilateral normal
caliber common iliac arteries which are markedly tortuous but
without evidence of a hemodynamically significant narrowing.

The bilateral internal iliac arteries are disease though patent and
of normal caliber.

The bilateral external iliac arteries are tortuous but widely patent
and of normal caliber.

The bilateral common and imaged portions of the bilateral deep and
superficial femoral arteries are mildly disease though of normal
caliber and without hemodynamically significant narrowing.

Veins: The IVC and pelvic venous systems appear widely patent.

Review of the MIP images confirms the above findings.

_________________________________________________________

NON-VASCULAR

Evaluation of abdominal organs is largely limited to the arterial
phase of enhancement. Evaluation of abdominal organs is further
degraded secondary to patient is cachectic state and lack of
mesenteric fat

Hepatobiliary: Normal hepatic contour. No discrete hyperenhancing
hepatic lesions. Normal appearance of the gallbladder given degree
distention. No radiopaque gallstones. No intra or extrahepatic
biliary ductal dilatation. No ascites.

Pancreas: Normal appearance of the pancreas.

Spleen: Normal appearance of the spleen.

Adrenals/Urinary Tract: There is symmetric enhancement and excretion
of the bilateral kidneys. Note is made of an approximately 1.2 cm
hypoattenuating nonenhancing cyst within the inferior medial aspect
the left kidney (coronal image 87, series 7). No discrete
right-sided renal lesions. No evidence of nephrolithiasis on this
postcontrast examination. No urinary obstruction or perinephric
stranding.

Normal appearance of the bilateral adrenal glands.

There is marked distension, trabeculation and apparent irregular
wall thickening involving the urinary bladder, similar though
progressed compared to the 4738 examination.

Stomach/Bowel: Moderate to large colonic stool burden without
evidence of enteric obstruction. No discrete areas of bowel wall
thickening, though again, evaluation is degraded secondary to
patient is cachectic state and lack of mesenteric fat. Normal
appearance of the terminal ileum. The appendix is not confidently
identified however there is no definitive pericecal inflammatory
change.

Lymphatic: No bulky retroperitoneal, mesenteric, pelvic or inguinal
lymphadenopathy, though again, evaluation degraded secondary to
patient cachectic state lack of mesenteric fat.

Reproductive: Appears normal. No definitive free fluid the pelvic
cul-de-sac.

Other: Patient appears cachectic.

Musculoskeletal: No acute or aggressive osseous abnormalities. Mild
(approximately 25 percent) compression deformity involving the
superior endplate of the L1 vertebral body and partial ankylosis of
the posterior elements of the L1-L2 vertebral bodies is unchanged
since at least the 4738 examination, likely posttraumatic/iatrogenic
in etiology.

_________________________________________________________

_________________________________________________________
IMPRESSION: Chest CTA impression:

1. Stable uncomplicated fusiform ectasia of the ascending thoracic
aorta measuring 42 mm in diameter, grossly unchanged since the 4738
examination though slightly increased in size compared to remote
examination performed in 7117, previously, 40 mm.
2. Interval development of an approximately 6 mm right upper lobe
pulmonary nodule as well as mildly enlarged mediastinal and hilar
lymph nodes. In this patient with severe emphysema and history of
multiple previous waxing and waning pulmonary nodules/opacities,
this nodule and associated borderline mediastinal/hilar adenopathy
may be reactive in etiology and as such a six-month
contrast-enhanced chest CT is recommended to ensure resolution
and/or stability. This recommendation follows the consensus
statement: Guidelines for Management of Incidental Pulmonary Nodules
Detected on CT Images: From the [HOSPITAL] 6459; Radiology
6459; [DATE].
3. Remaining pulmonary nodules and areas of scarring are unchanged
since at least the 4738 examination and thus of benign etiology.
4. Aortic Atherosclerosis (LBOEF-U97.7) and Emphysema (LBOEF-SLH.Q).

Abdomen and pelvic CTA impression:

1. Interval increase in size of juxtarenal abdominal aortic
aneurysm, currently measuring 4.1 cm in maximal diameter,
previously, 3.8 cm when compared to the [DATE] examination.
Recommend follow-up aortic ultrasound in 1 year. This recommendation
follows ACR consensus guidelines: White Paper of the ACR Incidental
Findings Committee II on Vascular Findings. [HOSPITAL] 2117;
[DATE].
2. Interval increase in now moderate to large amount of slightly
irregular predominantly noncalcified crescentic mural thrombus
within the dominant component of the abdominal aortic aneurysm not
resulting in a hemodynamically significant stenosis. No evidence of
dissection or periaortic stranding.
3. The bilateral common iliac arteries are markedly tortuous lower
normal caliber and without evidence of a hemodynamically significant
stenosis.
4.  Aortic Atherosclerosis (LBOEF-U97.7).

ADDENDUM:
Marked distension, trabeculation and apparent irregular wall
thickening involving the urinary bladder, similar, though progressed
compared to the 4738 examination. Correlation with urinalysis and
potential cystoscopy could be performed as indicated.

*** End of Addendum ***
11/18/2018; 05/22/2018;
04/03/2017; 10/16/2016; 06/28/2016; 10/11/2006

CT chest, abdomen pelvis-04/10/2016

Abdominal CT-07/14/2018

PET-CT-04/26/2016
FINDINGS: CTA CHEST FINDINGS

Vascular Findings:

Redemonstrated mild fusiform ectasia of the ascending thoracic aorta
with measurements as follows. The thoracic aorta tapers to a normal
caliber at the level of the aortic arch.

Scattered mixed calcified and noncalcified atherosclerotic plaque
involving the aortic arch and descending thoracic aorta, not
resulting in a hemodynamically significant stenosis. No evidence of
thoracic aortic dissection or periaortic stranding on this non gated
examination.

Conventional configuration of the aortic arch. The branch vessels of
the aortic arch appear patent throughout their imaged courses.

Cardiomegaly. Coronary artery calcifications. No pericardial
effusion.

Although this examination was not tailored for the evaluation the
pulmonary arteries, there are no discrete filling defects within the
central pulmonary arterial tree to suggest central pulmonary
embolism. Normal caliber of the main pulmonary artery.

-------------------------------------------------------------

Thoracic aortic measurements:

Sinotubular junction

32 mm as measured in greatest oblique short axis coronal dimension.

Proximal ascending aorta

42 mm as measured in greatest oblique short axis axial dimension at
the level of the main pulmonary artery (axial image 80, series 4)
and approximately 41 mm in greatest oblique short axis coronal
diameter (coronal image 47, series 7), grossly unchanged since the
4738 examination though slightly increased in size compared to
remote examination performed in 7117, previously, 40 mm.

Aortic arch aorta

29 mm as measured in greatest oblique short axis sagittal dimension.

Proximal descending thoracic aorta

28 mm as measured in greatest oblique short axis axial dimension at
the level of the main pulmonary artery.

Distal descending thoracic aorta

28 mm as measured in greatest oblique short axis axial dimension at
the level of the diaphragmatic hiatus.

Review of the MIP images confirms the above findings.

-------------------------------------------------------------

Non-Vascular Findings:

Mediastinum/Lymph Nodes: Majority of recent prior examinations have
been performed without contrast however the present contrast
examination demonstrates development of mediastinal and hilar
lymphadenopathy with index pretracheal lymph node measuring 1.2 cm
in greatest short axis diameter (image 48, series 4), index AP
window lymph node measuring 1.2 cm (image 65,), index right
suprahilar nodal conglomeration measuring approximately 1.3 cm
(image 79) and index left infrahilar lymph node measuring
approximately 1 cm (image 86), slightly progressed compared to
remote chest CT performed [DATE]. No axillary lymphadenopathy.

Lungs/Pleura: Redemonstrated severe predominantly centrilobular
emphysematous change.

Improved aeration of both the anterior basilar aspect of the left
lower lobe as well as the subpleural aspect of the right lower with
minimal residual right basilar subsegmental atelectasis.
Subsegmental atelectasis also seen involving the caudal aspect of
the right middle lobe.

Perifissural lymph nodes along the left fissure are unchanged since
at least the 4738 examination (images 126 and 132, series 6).

The approximately 0.6 cm subpleural right lower lobe pulmonary
nodule (image 128, series 6), is unchanged since at least the 4738
examination and thus of benign etiology.

The oblong linear at least 3.9 x 0.9 cm opacity within the left
upper lobe (representative coronal image 53, series 7), is unchanged
since at least the [DATE] examination and thus favored to represent
an area of scarring.

Interval development of an approximately 0.6 cm somewhat spiculated
opacity within the right upper lobe (image 63, series 6

Musculoskeletal: No acute or aggressive osseous abnormalities within
the chest. Regional soft tissues appear normal. Mild heterogeneity
of the thyroid without discrete nodule.

_________________________________________________________

_________________________________________________________

CTA ABDOMEN AND PELVIS FINDINGS

VASCULAR

Aorta: Juxtarenal abdominal aortic aneurysm measuring approximately
4.1 x 4.1 cm (axial image 181, series 4; coronal image 61, series
7), increased in size compared to the [DATE] examination,
previously, 3.8 x 3.6 cm.

The aneurysm is noted to arise immediately caudal to the take-off of
the bilateral renal arteries and extends to the level of the aortic
bifurcation without extension to involve either of the common iliac
arteries.

There is a moderate to large amount of crescentic predominantly
noncalcified mural thrombus within the dominant component of the
aneurysm, progressed in the interval. No evidence of abdominal
aortic dissection or definitive evidence of periaortic stranding.

Celiac: There is a minimal amount of eccentric noncalcified
atherosclerotic plaque involving the origin of the celiac artery,
not resulting in a hemodynamically significant stenosis.
Conventional branching pattern.

SMA: Widely patent without hemodynamically significant narrowing.
Conventional branching pattern. The distal tributaries of the SMA
appear widely patent without discrete lumen filling defect to
suggest distal embolism.

Renals: Duplicated left renal arteries, nearly codominant; there is
a minimal amount of mixed calcified and noncalcified atherosclerotic
plaque involving both left-sided duplicated nearly could dominant
renal arteries though not definitely resulting in hemodynamically
significant stenosis. The solitary right renal artery is widely
patent without hemodynamically significant narrowing. No discrete
areas of vessel irregularity to suggest FMD.

IMA: Appears occluded at its origin with early reconstitution via
collateral supply from the SMA. Additionally, there appears to be a
hypertrophied median sacral artery.

Inflow: There is a moderate amount of eccentric mixed calcified and
noncalcified atherosclerotic plaque involving the bilateral normal
caliber common iliac arteries which are markedly tortuous but
without evidence of a hemodynamically significant narrowing.

The bilateral internal iliac arteries are disease though patent and
of normal caliber.

The bilateral external iliac arteries are tortuous but widely patent
and of normal caliber.

The bilateral common and imaged portions of the bilateral deep and
superficial femoral arteries are mildly disease though of normal
caliber and without hemodynamically significant narrowing.

Veins: The IVC and pelvic venous systems appear widely patent.

Review of the MIP images confirms the above findings.

_________________________________________________________

NON-VASCULAR

Evaluation of abdominal organs is largely limited to the arterial
phase of enhancement. Evaluation of abdominal organs is further
degraded secondary to patient is cachectic state and lack of
mesenteric fat

Hepatobiliary: Normal hepatic contour. No discrete hyperenhancing
hepatic lesions. Normal appearance of the gallbladder given degree
distention. No radiopaque gallstones. No intra or extrahepatic
biliary ductal dilatation. No ascites.

Pancreas: Normal appearance of the pancreas.

Spleen: Normal appearance of the spleen.

Adrenals/Urinary Tract: There is symmetric enhancement and excretion
of the bilateral kidneys. Note is made of an approximately 1.2 cm
hypoattenuating nonenhancing cyst within the inferior medial aspect
the left kidney (coronal image 87, series 7). No discrete
right-sided renal lesions. No evidence of nephrolithiasis on this
postcontrast examination. No urinary obstruction or perinephric
stranding.

Normal appearance of the bilateral adrenal glands.

There is marked distension, trabeculation and apparent irregular
wall thickening involving the urinary bladder, similar though
progressed compared to the 4738 examination.

Stomach/Bowel: Moderate to large colonic stool burden without
evidence of enteric obstruction. No discrete areas of bowel wall
thickening, though again, evaluation is degraded secondary to
patient is cachectic state and lack of mesenteric fat. Normal
appearance of the terminal ileum. The appendix is not confidently
identified however there is no definitive pericecal inflammatory
change.

Lymphatic: No bulky retroperitoneal, mesenteric, pelvic or inguinal
lymphadenopathy, though again, evaluation degraded secondary to
patient cachectic state lack of mesenteric fat.

Reproductive: Appears normal. No definitive free fluid the pelvic
cul-de-sac.

Other: Patient appears cachectic.

Musculoskeletal: No acute or aggressive osseous abnormalities. Mild
(approximately 25 percent) compression deformity involving the
superior endplate of the L1 vertebral body and partial ankylosis of
the posterior elements of the L1-L2 vertebral bodies is unchanged
since at least the 4738 examination, likely posttraumatic/iatrogenic
in etiology.

_________________________________________________________

_________________________________________________________
IMPRESSION: Chest CTA impression:

1. Stable uncomplicated fusiform ectasia of the ascending thoracic
aorta measuring 42 mm in diameter, grossly unchanged since the 4738
examination though slightly increased in size compared to remote
examination performed in 7117, previously, 40 mm.
2. Interval development of an approximately 6 mm right upper lobe
pulmonary nodule as well as mildly enlarged mediastinal and hilar
lymph nodes. In this patient with severe emphysema and history of
multiple previous waxing and waning pulmonary nodules/opacities,
this nodule and associated borderline mediastinal/hilar adenopathy
may be reactive in etiology and as such a six-month
contrast-enhanced chest CT is recommended to ensure resolution
and/or stability. This recommendation follows the consensus
statement: Guidelines for Management of Incidental Pulmonary Nodules
Detected on CT Images: From the [HOSPITAL] 6459; Radiology
6459; [DATE].
3. Remaining pulmonary nodules and areas of scarring are unchanged
since at least the 4738 examination and thus of benign etiology.
4. Aortic Atherosclerosis (LBOEF-U97.7) and Emphysema (LBOEF-SLH.Q).

Abdomen and pelvic CTA impression:

1. Interval increase in size of juxtarenal abdominal aortic
aneurysm, currently measuring 4.1 cm in maximal diameter,
previously, 3.8 cm when compared to the [DATE] examination.
Recommend follow-up aortic ultrasound in 1 year. This recommendation
follows ACR consensus guidelines: White Paper of the ACR Incidental
Findings Committee II on Vascular Findings. [HOSPITAL] 2117;
[DATE].
2. Interval increase in now moderate to large amount of slightly
irregular predominantly noncalcified crescentic mural thrombus
within the dominant component of the abdominal aortic aneurysm not
resulting in a hemodynamically significant stenosis. No evidence of
dissection or periaortic stranding.
3. The bilateral common iliac arteries are markedly tortuous lower
normal caliber and without evidence of a hemodynamically significant
stenosis.
4.  Aortic Atherosclerosis (LBOEF-U97.7).

## 2021-05-05 NOTE — Telephone Encounter (Signed)
Lm x1 for patient.  

## 2021-05-05 NOTE — Telephone Encounter (Signed)
Patient called back today to get his CT scheduled and was telling me that he doesn't feel like the meds prescribed for him is working like they should. He wanted to know if he could get a stronger antibiotic ?

## 2021-05-08 NOTE — Telephone Encounter (Signed)
Spoke to patient and relayed below message. He voiced his understanding.  ?Nothing further needed.  ? ?

## 2021-05-08 NOTE — Telephone Encounter (Signed)
Lm x2 for patient.  Will close encounter per office protocol.   

## 2021-05-10 ENCOUNTER — Other Ambulatory Visit: Payer: Self-pay | Admitting: Pulmonary Disease

## 2021-05-10 ENCOUNTER — Ambulatory Visit: Payer: Managed Care, Other (non HMO)

## 2021-05-12 ENCOUNTER — Other Ambulatory Visit
Admission: RE | Admit: 2021-05-12 | Discharge: 2021-05-12 | Disposition: A | Discharge status: Home / Self Care | Payer: Managed Care, Other (non HMO) | Attending: Adult Health | Admitting: Adult Health

## 2021-05-12 ENCOUNTER — Encounter: Payer: Self-pay | Admitting: Adult Health

## 2021-05-12 ENCOUNTER — Ambulatory Visit (INDEPENDENT_AMBULATORY_CARE_PROVIDER_SITE_OTHER): Payer: Managed Care, Other (non HMO) | Admitting: Adult Health

## 2021-05-12 ENCOUNTER — Other Ambulatory Visit: Payer: Self-pay | Admitting: Internal Medicine

## 2021-05-12 VITALS — BP 130/70 | HR 95 | Temp 97.7°F | Ht 65.0 in | Wt 108.0 lb

## 2021-05-12 DIAGNOSIS — E559 Vitamin D deficiency, unspecified: Secondary | ICD-10-CM | POA: Diagnosis not present

## 2021-05-12 DIAGNOSIS — J449 Chronic obstructive pulmonary disease, unspecified: Secondary | ICD-10-CM | POA: Diagnosis present

## 2021-05-12 DIAGNOSIS — J984 Other disorders of lung: Secondary | ICD-10-CM | POA: Diagnosis not present

## 2021-05-12 DIAGNOSIS — R252 Cramp and spasm: Secondary | ICD-10-CM

## 2021-05-12 DIAGNOSIS — J9611 Chronic respiratory failure with hypoxia: Secondary | ICD-10-CM

## 2021-05-12 DIAGNOSIS — K029 Dental caries, unspecified: Secondary | ICD-10-CM

## 2021-05-12 DIAGNOSIS — Z720 Tobacco use: Secondary | ICD-10-CM

## 2021-05-12 LAB — COMPREHENSIVE METABOLIC PANEL
ALT: 16 U/L (ref 0–44)
AST: 19 U/L (ref 15–41)
Albumin: 4.1 g/dL (ref 3.5–5.0)
Alkaline Phosphatase: 77 U/L (ref 38–126)
Anion gap: 8 (ref 5–15)
BUN: 21 mg/dL — ABNORMAL HIGH (ref 6–20)
CO2: 25 mmol/L (ref 22–32)
Calcium: 9.6 mg/dL (ref 8.9–10.3)
Chloride: 106 mmol/L (ref 98–111)
Creatinine, Ser: 0.95 mg/dL (ref 0.61–1.24)
GFR, Estimated: >60 mL/min (ref >60)
Glucose, Bld: 121 mg/dL — ABNORMAL HIGH (ref 70–99)
Potassium: 5 mmol/L (ref 3.5–5.1)
Sodium: 139 mmol/L (ref 135–145)
Total Bilirubin: 0.7 mg/dL (ref 0.3–1.2)
Total Protein: 8.2 g/dL — ABNORMAL HIGH (ref 6.5–8.1)

## 2021-05-12 NOTE — Assessment & Plan Note (Addendum)
?  Etiology  ?Check cmet / vitamin d  ?? Statin related, - follow up with cards , may hold statin x 1 week to see if this help  ?Increase water intake  ?Warm compresses  ?If persists will need to see PCP/cards - check CK /ESR level .  ? ? ?Plan  ?Marland Kitchen ?Patient Instructions  ?Continue Duoneb Four times a day   ?Work on not smoking  ?Continue on Oxygen 2l/m  At bedtime   ?Activity as tolerated.  ?Mucinex DM Twice daily  As needed  cough/congestion  ?Dental follow up when able.  ?CT chest , once done will decide on dose of prednisone .  ?Continue on Azithromycin three days a week.  ? ?Warm heat to muscles, increase water intake.  ?Labs today .  ? ?Discuss with Cardiology regarding leg cramps- can hold atorvastatin for 1 week to see if cramps get better. Will need to follow up with Cardiology .  ?Call Urology regarding bladder /testicle pain .  ? ?Follow up with Dr. Patsey Berthold in 4  weeks and As needed   ?Please contact office for sooner follow up if symptoms do not improve or worsen or seek emergency care  ? ? ?  ? ?

## 2021-05-12 NOTE — Assessment & Plan Note (Signed)
Compensated on present regimen  ?#1 goal is to quit smoking  ? ?Plan  ?Patient Instructions  ?Continue Duoneb Four times a day   ?Work on not smoking  ?Continue on Oxygen 2l/m  At bedtime   ?Activity as tolerated.  ?Mucinex DM Twice daily  As needed  cough/congestion  ?Dental follow up when able.  ?CT chest , once done will decide on dose of prednisone .  ?Continue on Azithromycin three days a week.  ? ?Warm heat to muscles, increase water intake.  ?Labs today .  ? ?Discuss with Cardiology regarding leg cramps- can hold atorvastatin for 1 week to see if cramps get better. Will need to follow up with Cardiology .  ?Call Urology regarding bladder /testicle pain .  ? ?Follow up with Dr. Patsey Berthold in 4  weeks and As needed   ?Please contact office for sooner follow up if symptoms do not improve or worsen or seek emergency care  ? ? ?  ? ?

## 2021-05-12 NOTE — Assessment & Plan Note (Signed)
Needs to follow up with dentist .  ?Advised to make ov . Wife says he has dental insurance  ?

## 2021-05-12 NOTE — Progress Notes (Signed)
? ?@Patient  ID: Clinton Walsh, male    DOB: 1960-02-02, 61 y.o.   MRN: [id number] ? ?Chief Complaint  ?Patient presents with  ? Follow-up  ? ? ?Referring provider: ?No ref. provider found ? ?HPI: ?61 year old male active smoker followed for severe COPD and abnormal CT chest with cavitary lung mass ?Medical history significant for congestive heart failure, coronary artery disease, polymyalgia rheumatica, AAA, previous TIA, ischemic cardiomyopathy ? ?TEST/EVENTS :  ?10/09/2006 through 01/06/2020 multiple CT chest: Reviewed ?09/20/2016 spirometry/DLCO: FEV1 0.99 L or 35% predicted, FVC 3.52 L or 99% predicted FEV1/FVC 28%, no bronchodilator response.  Diffusion capacity severely impaired at 28% however only 1 measurement obtained and could not obtain lung volumes due to patient's severe shortness of breath. ?01/04/2021 CT chest with contrast: Thick-walled cavitary lesion in the dependent right lower lobe measuring 7.2 x 3.6 cm, extensive clustered centrilobular nodularity and heterogeneous airspace consolidation throughout the right middle lobe and right lower lobe suggestive of infection/aspiration however will need follow-up as malignancy cannot be excluded.  0.6 cm spiculated nodule in the right upper lobe unchanged from prior, unchanged left upper lobe findings ?  ?Lung path 03/07/21 : SURGICAL PATHOLOGY  ?A. LUNG, RIGHT LOWER LOBE; CT-GUIDED CORE NEEDLE BIOPSY:  ?- SUSPICIOUS FOR MALIGNANCY.  ?- FOCAL PNEUMOCYTE ATYPIA, IN A BACKGROUND OF ORGANIZING PNEUMONIA AND  ?NON-NECROTIZING GRANULOMATOUS INFLAMMATION.  ? ?Comment:  ?Biopsy sections predominantly show benign alveolated lung parenchyma  ?with numerous fibroblastic foci and non-necrotizing epithelioid  ?granulomas. Two minute focal areas, each less than 1 mm, display an  ?atypical bronchial epithelial proliferation, with abnormal cells showing  ?increased size and significant nuclear atypia. Immunohistochemical  ?studies show these atypical cells to be  positive for CK7 and TTF-1, and  ?negative for p40, compatible with pneumocytes. AFB and GMS special  ?stains are negative for acid fast and fungal organisms, respectively.  ? ?The significance of this atypical cell population is unclear, and the  ?presence of an unsampled malignancy cannot be entirely excluded. ? ?05/12/2021 Follow up : COPD, oxygen dependent respiratory failure and cavitary lung mass ?Patient presents for a 1 month follow-up.  Patient has been treated for a cavitary lung mass.  CT chest February 07, 2021 shows severe emphysematous changes with right lower lobe consolidation and with cavitation.  PET scan on February 16, 2021 showed markedly irregular and cavitary area in the right lower lobe with significant FDG uptake with SUV at 9.8 measuring 7.4 x 3.4 cm.  Patient underwent CT-guided biopsy with surgical path showing focal pneumocyte atypia, in a background of organizing pneumonia and nonnecrotizing granulomatous inflammation, suspicious for malignancy. ?Patient was started on prednisone 20 mg daily and azithromycin 250 mg 3 days a week. ?Patient says he has had no significant change in his symptoms.  Continues to have cough and shortness of breath.  Continues to smoke every day.  Patient complains of muscle cramps that have become progressively worse in his calves arms and hands.  He has tried some over-the-counter magnesium and potassium.  Patient was recommended to follow-up with dental office as he has significant dental caries.  Patient says he has not been able to do this as up to date.  Patient was also set up for CT chest that unfortunately has not been completed yet.  Patient denies any hemoptysis nausea vomiting or diarrhea. ? ? ?Has chronic foley. Complains of chronic pain and swelling in testicles . Follows with Urology .  ? ?Allergies  ?Allergen Reactions  ? Sulfa Antibiotics Rash  ? ? ?  There is no immunization history for the selected administration types on file for this  patient. ? ?Past Medical History:  ?Diagnosis Date  ? Abdominal aortic aneurysm (AAA) ([place])   ? 4.3 cm by CT in 07/2018; b. 01/2020 CT: 4.1x4.1cm juxtarenal AAA. Mod-large mural thrombus.  ? CAD (coronary artery disease)   ? a.  2008 Cath: nonobs dzs;  b. 03/2011 NSTEMI: 100 D1 (2.25x18 MiniVision BMS);  c. 04/2011 NSTEMI/PCI: D1 100->thrombectomy/PTCA;  d. 04/2016 Inf STEMI: RCA 18m (3.0x34 Resolute Integrity DES); e. 07/2016 PCI: LAD 60 (FFR 0.78-->2.5x23 Xience Alpine DES); 12/2019 PCI: LM nl, LAD 63m, 73m ISR/70d (2.75x26 Resolute Onyx DESp; 2.25x12 Resolute Onyx DESm), LCX 60d, RCA 25p ISR.  ? HFrEF (heart failure with reduced ejection fraction) ([place])   ? a. 04/2016 Echo: EF 30-35%, diff HK, Gr1 DD, triv AI, mild to mod MR;  b. 07/2016 LV gram: EF 35-40%; c. 11/2017 Echo: EF 40-45%, Gr2 DD; d. 10/2019 Echo: EF 30-35%, glob HK. GrII DD; e. 11/2020 Echo: EF 35-40%, GrI DD. Low-nl RV fxn. Mild MR/AI.  ? Hyperlipidemia   ? Ischemic cardiomyopathy   ? a. 04/2016 Echo: EF 30-35%, diff HK, Gr1 DD;  b. 07/2016 LV gram: EF 35-40%; c. 11/2017 Echo: EF 40-45%; d. 10/2019 Echo: EF 30-35%, grII DD; e. 11/2020 Echo: EF 35-40%, GrI DD.  ? Mediastinal adenopathy   ? a. 04/2017 CT chest: stable mediastinal and hilar lymphadenopathy w/ speckled Ca2+. Suspicious for sarcoidosis.  ? Polymyalgia rheumatica ([place]) 02/2015  ? Pulmonary nodules   ? a. nonspecific nodules in the right middle lobe and right lower lobe by CT; b. 04/2017 CT Chest: Stable RML/RLL nodules - followed by pulm; b. 01/2020 CT Chest: 78mm RUL pulm nodule as well as mediastinal and hilar nodes.  ? Thoracic ascending aortic aneurysm ([place])   ? a. 06/2016 CTA: stable 4 cm TAA; b. 01/2020 CT Chest: 4.2cm TAA; c. 01/2021 CTA Chest: 4.1 x 4.1cm TAA.  ? TIA (transient ischemic attack)   ? a. 10/2017 possible TIA - transient R sided wkns; b. 11/2017 Carotid U/S: nl.  ? Tobacco abuse   ? ? ?Tobacco History: ?Social History  ? ?Tobacco Use  ?Smoking Status Every Day  ? Packs/day: 1.50  ?  Years: 30.00  ? Pack years: 45.00  ? Types: E-cigarettes, Cigarettes  ?Smokeless Tobacco Never  ?Tobacco Comments  ? 0.5-1PPD 05/12/2021  ? ?Ready to quit: Not Answered ?Counseling given: Not Answered ?Tobacco comments: 0.5-1PPD 05/12/2021 ? ? ?Outpatient Medications Prior to Visit  ?Medication Sig Dispense Refill  ? amLODipine (NORVASC) 2.5 MG tablet TAKE 1 TABLET BY MOUTH EVERY DAY 90 tablet 0  ? aspirin EC 81 MG tablet Take 1 tablet (81 mg total) by mouth daily. Swallow whole. 90 tablet 3  ? atorvastatin (LIPITOR) 80 MG tablet TAKE 1 TABLET BY MOUTH EVERY DAY 90 tablet 1  ? azithromycin (ZITHROMAX) 250 MG tablet Take 250 mg by mouth daily.    ? clopidogrel (PLAVIX) 75 MG tablet Take 1 tablet (75 mg total) by mouth daily. 90 tablet 0  ? ipratropium-albuterol (DUONEB) 0.5-2.5 (3) MG/3ML SOLN QID AND PRN 450 mL 10  ? losartan (COZAAR) 25 MG tablet TAKE 1 TABLET BY MOUTH EVERY DAY 90 tablet 0  ? nitroGLYCERIN (NITROSTAT) 0.4 MG SL tablet Place 1 tablet (0.4 mg total) under the tongue every 5 (five) minutes as needed for chest pain. 25 tablet 3  ? OXYGEN Inhale 2 L into the lungs at bedtime.    ?  predniSONE (DELTASONE) 20 MG tablet TAKE 1 TABLET BY MOUTH DAILY WITH BREAKFAST 30 tablet 1  ? ?No facility-administered medications prior to visit.  ? ? ? ?Review of Systems:  ? ?Constitutional:   No  weight loss, night sweats,  Fevers, chills, ?+ fatigue, or  lassitude. ? ?HEENT:   No headaches,  Difficulty swallowing,  Tooth/dental problems, or  Sore throat,  ?              No sneezing, itching, ear ache, nasal congestion, post nasal drip,  ? ?CV:  No chest pain,  Orthopnea, PND, swelling in lower extremities, anasarca, dizziness, palpitations, syncope.  ? ?GI  No heartburn, indigestion, abdominal pain, nausea, vomiting, diarrhea, change in bowel habits, loss of appetite, bloody stools.  ? ?Resp:   No chest wall deformity ? ?Skin: no rash or lesions. ? ?GU: no dysuria, change in color of urine, no urgency or frequency.  No  flank pain, no hematuria  ? ?MS:  No joint pain or swelling.  No decreased range of motion.  No back pain. ? ? ? ?Physical Exam ? ?BP 130/70 (BP Location: Left Arm, Cuff Size: Normal)   Pulse 95   Temp 97.7 ?F (36.5

## 2021-05-12 NOTE — Assessment & Plan Note (Signed)
Smoking cessation  

## 2021-05-12 NOTE — Assessment & Plan Note (Signed)
Cont on O2 At bedtime   

## 2021-05-12 NOTE — Assessment & Plan Note (Signed)
Lung mass , CT bx with organizing features on path  ?Continue on Azithro and Prednisone 20mg  daily  ?Repeat CT chest is pending , once results are back can decide on prednisone taper.  ? ?Plan ?Patient Instructions  ?Continue Duoneb Four times a day   ?Work on not smoking  ?Continue on Oxygen 2l/m  At bedtime   ?Activity as tolerated.  ?Mucinex DM Twice daily  As needed  cough/congestion  ?Dental follow up when able.  ?CT chest , once done will decide on dose of prednisone .  ?Continue on Azithromycin three days a week.  ? ?Warm heat to muscles, increase water intake.  ?Labs today .  ? ?Discuss with Cardiology regarding leg cramps- can hold atorvastatin for 1 week to see if cramps get better. Will need to follow up with Cardiology .  ?Call Urology regarding bladder /testicle pain .  ? ?Follow up with Dr. Patsey Berthold in 4  weeks and As needed   ?Please contact office for sooner follow up if symptoms do not improve or worsen or seek emergency care  ? ? ?  ? ?

## 2021-05-12 NOTE — Patient Instructions (Addendum)
Continue Duoneb Four times a day   ?Work on not smoking  ?Continue on Oxygen 2l/m  At bedtime   ?Activity as tolerated.  ?Mucinex DM Twice daily  As needed  cough/congestion  ?Dental follow up when able.  ?CT chest , once done will decide on dose of prednisone .  ?Continue on Azithromycin three days a week.  ? ?Warm heat to muscles, increase water intake.  ?Labs today .  ? ?Discuss with Cardiology regarding leg cramps- can hold atorvastatin for 1 week to see if cramps get better. Will need to follow up with Cardiology .  ?Call Urology regarding bladder /testicle pain .  ? ?Follow up with Dr. Patsey Berthold in 4  weeks and As needed   ?Please contact office for sooner follow up if symptoms do not improve or worsen or seek emergency care  ? ? ?

## 2021-05-15 ENCOUNTER — Ambulatory Visit
Admission: RE | Admit: 2021-05-15 | Discharge: 2021-05-15 | Disposition: A | Discharge status: Home / Self Care | Payer: Managed Care, Other (non HMO) | Source: Ambulatory Visit | Attending: Pulmonary Disease | Admitting: Pulmonary Disease

## 2021-05-15 DIAGNOSIS — J189 Pneumonia, unspecified organism: Secondary | ICD-10-CM | POA: Diagnosis not present

## 2021-05-18 ENCOUNTER — Other Ambulatory Visit: Payer: Self-pay

## 2021-05-18 DIAGNOSIS — J984 Other disorders of lung: Secondary | ICD-10-CM

## 2021-05-18 NOTE — Progress Notes (Signed)
Agree with the details of the visit as noted by Tammy Parrett, NP.  C. Laura Hassen Bruun, MD [facility] 

## 2021-05-25 LAB — VITAMIN D 1,25 DIHYDROXY
Vitamin D 1, 25 (OH)2 Total: 27 pg/mL
Vitamin D2 1, 25 (OH)2: <10 pg/mL
Vitamin D3 1, 25 (OH)2: 27 pg/mL

## 2021-05-30 ENCOUNTER — Telehealth: Payer: Self-pay | Admitting: Pulmonary Disease

## 2021-05-30 NOTE — Telephone Encounter (Signed)
Clinton Needles, NP  05/26/2021  5:19 PM EDT     Vitamin D levels are in the low normal range.  Follow-up with primary care for ongoing management    Patient is aware of result and voiced his understanding. He stated that he does not have a PCP.  He also stated that she stopped prednisone three nights ago due to cramps. Since stopping the prednisone, the cramping as subsided. She would like an alternative.   Tammy, please advise. Thanks

## 2021-05-30 NOTE — Progress Notes (Signed)
ATC x1.  LVM to return call. 

## 2021-05-30 NOTE — Telephone Encounter (Signed)
He is suppose to see Dr. Darnell Level in 4 weeks I do not see that ov made, will need to discuss at [facility] . He was suppose to stay on Prednisone and wean off .

## 2021-05-30 NOTE — Telephone Encounter (Signed)
Spoke to patient and relayed below message. He voiced his understanding. Appt schedule 06/13/2021 at 4:30. Nothing further needed.

## 2021-05-31 ENCOUNTER — Other Ambulatory Visit: Payer: Self-pay | Admitting: Pulmonary Disease

## 2021-06-13 ENCOUNTER — Ambulatory Visit (INDEPENDENT_AMBULATORY_CARE_PROVIDER_SITE_OTHER): Payer: Managed Care, Other (non HMO) | Admitting: Pulmonary Disease

## 2021-06-13 ENCOUNTER — Encounter: Payer: Self-pay | Admitting: Pulmonary Disease

## 2021-06-13 VITALS — BP 120/72 | HR 93 | Temp 97.9°F | Ht 65.0 in | Wt 107.6 lb

## 2021-06-13 DIAGNOSIS — J984 Other disorders of lung: Secondary | ICD-10-CM | POA: Diagnosis not present

## 2021-06-13 DIAGNOSIS — I5022 Chronic systolic (congestive) heart failure: Secondary | ICD-10-CM | POA: Diagnosis not present

## 2021-06-13 DIAGNOSIS — I255 Ischemic cardiomyopathy: Secondary | ICD-10-CM

## 2021-06-13 DIAGNOSIS — J449 Chronic obstructive pulmonary disease, unspecified: Secondary | ICD-10-CM | POA: Diagnosis not present

## 2021-06-13 DIAGNOSIS — R1013 Epigastric pain: Secondary | ICD-10-CM

## 2021-06-13 DIAGNOSIS — F17210 Nicotine dependence, cigarettes, uncomplicated: Secondary | ICD-10-CM

## 2021-06-13 MED ORDER — PANTOPRAZOLE SODIUM 40 MG PO TBEC: 40.0000 mg | DELAYED_RELEASE_TABLET | Freq: Every day | ORAL | 2 refills | Status: DC

## 2021-06-13 NOTE — Patient Instructions (Signed)
Keep your appointment for 25 July.  You will be scheduled for the CT follow-up on your right lung issue.  I sent a prescription to the pharmacy to take care of the issues you are having with your stomach.

## 2021-06-13 NOTE — Progress Notes (Signed)
Subjective:    Patient ID: Clinton Walsh, male    DOB: 1960/04/08, 61 y.o.   MRN: [id number] Patient Care Team: Pcp, No as PCP - General End, Harrell Gave, MD as PCP - Cardiology (Cardiology) Flora Lipps, MD as Consulting Physician (Pulmonary Disease) End, Harrell Gave, MD as Consulting Physician (Cardiology) Schnier, Dolores Lory, MD (Vascular Surgery)  Chief Complaint  Patient presents with   Follow-up    Stopped prednisone due to cramping. C/o prod cough with white sputum, SOB with exertion and wheezing.    HPI Patient is a 61 year old current smoker who presents for follow-up of a right cavitary lung process with associated necrotizing pneumonia.  Patient underwent biopsy of this mass on 07 March 2021.  Had elements of organizing pneumonia and granulomatous inflammation.  Started on prednisone and azithromycin.  Since started on the prednisone and azithromycin has been doing well.  He does have nocturnal hypoxemia and is compliant with oxygen at 2 L/min nocturnally.  His only complaint today is that of dyspepsia.  He has noted severe gastroesophageal reflux symptoms.  He has not had any fevers, chills or sweats since starting on the prednisone Azithromycin regimen.  He unfortunately continues to smoke.  He does note that DuoNeb's do help his breathing.  He is quite taciturn and does not endorse any other symptomatology today.  DATA 10/09/2006 through 01/06/2020 multiple CT chest: Reviewed 09/20/2016 spirometry/DLCO: FEV1 0.99 L or 35% predicted, FVC 3.52 L or 99% predicted FEV1/FVC 28%, no bronchodilator response.  Diffusion capacity severely impaired at 28% however only 1 measurement obtained and could not obtain lung volumes due to patient's severe shortness of breath. 01/04/2021 CT chest with contrast: Thick-walled cavitary lesion in the dependent right lower lobe measuring 7.2 x 3.6 cm, extensive clustered centrilobular nodularity and heterogeneous airspace consolidation throughout  the right middle lobe and right lower lobe suggestive of infection/aspiration however will need follow-up as malignancy cannot be excluded.  0.6 cm spiculated nodule in the right upper lobe unchanged from prior, unchanged left upper lobe findings 02/16/2021 PET/CT: Irregular and cavitary area of posterior right lower lobe significant FDG uptake maximum SUV 9.8, biopsy suggested, other areas of less hypermetabolic change in the right middle lobe consistent with pneumonitis, stable left upper lobe nodule without substantial metabolic activity. 03/07/2021 CT-guided core needle biopsy: LUNG, RIGHT LOWER LOBE; CT-GUIDED CORE NEEDLE BIOPSY, SUSPICIOUS FOR MALIGNANCY. FOCAL PNEUMOCYTE ATYPIA, IN A BACKGROUND OF ORGANIZING PNEUMONIA AND NON-NECROTIZING GRANULOMATOUS INFLAMMATION 05/15/2021 CT chest: Persistent residual cavitary lesion posterior right and upper lower lobes with reduction on the infection/inflammation from prior.  Though the findings are persistent they are overall improving.  Review of Systems A 10 point review of systems was performed and it is as noted above otherwise negative.  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 02/24/2021   Urinary retention 12/17/2019   Unstable angina ([place]) 12/03/2019   Thoracic aortic aneurysm without rupture ([place]) 09/17/2019   Essential hypertension 09/17/2019   Chronic respiratory failure with hypoxia ([place]) 06/15/2019   Pneumonia 05/29/2019   Pulmonary emphysema ([place]) 12/01/2018   PVC's (premature ventricular contractions) 11/20/2018   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[place]) 06/19/2018   AAA (abdominal aortic aneurysm) without rupture ([place]) 06/19/2018   Abdominal pain, epigastric 06/19/2018   TIA  (transient  ischemic attack) 10/23/2017   Stable angina (HCC) 07/04/2016   Chronic HFrEF (heart failure with reduced ejection fraction) ([place]) 06/07/2016   Ischemic cardiomyopathy 04/25/2016   Pulmonary nodules 04/25/2016   Abnormal CT of the chest 04/19/2016   Aortic dilatation ([place]) 04/19/2016   STEMI involving right coronary artery ([place]) 04/10/2016   Panlobular emphysema ([place]) 02/02/2015   Gastro-esophageal reflux disease without esophagitis 02/02/2015   Coronary artery disease of native artery of native heart with stable angina pectoris ([place]) 02/02/2015   Polyarthralgia 02/02/2015   Tobacco abuse 02/02/2015   Dyspnea 10/16/2011   NSTEMI (non-ST elevated myocardial infarction) ([place]) 03/18/2011   Hyperlipidemia LDL goal <70 03/18/2011   History of multiple pulmonary nodules 03/18/2011   Tobacco use disorder 03/18/2011   Bradycardia 03/18/2011   Social History   Tobacco Use   Smoking status: Every Day    Packs/day: 1.50    Years: 30.00    Total pack years: 45.00    Types: E-cigarettes, Cigarettes   Smokeless tobacco: Never   Tobacco comments:    5 cigarettes daily 06/13/2021  Substance Use Topics   Alcohol use: No    Alcohol/week: 0.0 standard drinks of alcohol    Comment: none x 10y   Allergies  Allergen Reactions   Sulfa Antibiotics Rash   Current Meds  Medication Sig   amLODipine (NORVASC) 2.5 MG tablet TAKE 1 TABLET BY MOUTH EVERY DAY   aspirin EC 81 MG tablet Take 1 tablet (81 mg total) by mouth daily. Swallow whole.   azithromycin (ZITHROMAX) 250 MG tablet TAKE 1 TABLET BY MOUTH EVERY M, W AND F   clopidogrel (PLAVIX) 75 MG tablet Take 1 tablet (75 mg total) by mouth daily.   ipratropium-albuterol (DUONEB) 0.5-2.5 (3) MG/3ML SOLN QID AND PRN   losartan (COZAAR) 25 MG tablet TAKE 1 TABLET BY MOUTH EVERY DAY   nitroGLYCERIN (NITROSTAT) 0.4 MG SL tablet Place 1 tablet (0.4 mg total) under the tongue every 5 (five) minutes as needed for chest pain.   OXYGEN Inhale 2 L  into the lungs at bedtime.   pantoprazole (PROTONIX) 40 MG tablet Take 1 tablet (40 mg total) by mouth daily.   There is no immunization history for the selected administration types on file for this patient.     Objective:   Physical Exam BP 120/72 (BP Location: Left Arm, Cuff Size: Normal)   Pulse 93   Temp 97.9 F (36.6 C) (Temporal)   Ht 5\' 5"  (1.651 m)   Wt 107 lb 9.6 oz (48.8 kg)   SpO2 98%   BMI 17.91 kg/m  GENERAL: Thin/cachectic gentleman, very depressed/flat affect.  Fully ambulatory, no conversational dyspnea. Disheveled, looks much older than stated age. HEAD: Normocephalic, atraumatic.  EYES: Pupils equal, round, reactive to light.  No scleral icterus.  MOUTH: Terrible dentition.  No thrush NECK: Supple. No thyromegaly. Trachea midline. No JVD.  No adenopathy. PULMONARY: Very distant breath sounds.  Poor air movement, no adventitious sounds. CARDIOVASCULAR: S1 and S2. Regular rate and rhythm.  Very distant heart tones, no overt murmurs rubs or gallops heard. ABDOMEN: Scaphoid, otherwise benign. MUSCULOSKELETAL: No joint deformity, no clubbing, no edema.  NEUROLOGIC: Psychomotor retardation noted, no focal signs.  No gait disturbance.  Speech is fluent. SKIN: Intact,warm,dry. PSYCH: Flat affect.  Psychomotor retardation.     Assessment & Plan:     ICD-10-CM   1. Cavitating mass in right lower lung lobe  J98.4    Patient had necrotizing pneumonia Biopsy with necrotizing pneumonia  and granulomatous inflammation Suspect organizing pneumonia On prednisone and azithromyci    2. Stage 4 very severe COPD by GOLD classification ([place])  J44.9    Very end-stage Not candidate for surgical intervention Continue nebulizer therapy Needs to quit smoking Last FEV1 of record 2018 FEV1 0.99 L or 35%     3. Dyspepsia  R10.13    Pantoprazole Antireflux measures    4. Chronic HFrEF (heart failure with reduced ejection fraction) (HCC)  I50.22    This issue adds complexity to  his management Followed by cardiology Adds to his comorbidities and increased risk    5. Ischemic cardiomyopathy  I25.5    This issue adds complexity to his management Last LVEF 35%    6. Tobacco dependence due to cigarettes  F17.210    Patient counseled regards to discontinuation of smoking     Meds ordered this encounter  Medications   pantoprazole (PROTONIX) 40 MG tablet    Sig: Take 1 tablet (40 mg total) by mouth daily.    Dispense:  30 tablet    Refill:  2   Patient is to have follow-up on 25 July he has been encouraged to keep that appointment.  CT scan of the chest will be ordered following that visit.  Options are very limited due his multiple severe comorbidities.  He may need rebiopsy of the area however he is not too enthusiastic about this.  Though prior CTs have shown some improvement cavitary findings persist.  These will have to be followed up expectantly.  Patient is exhibiting significant dyspepsia.  Proceed with treating with Protonix.  We will see the patient in follow-up on 25 July as noted.  Renold Don, MD [facility]    *This note was dictated using voice recognition software/Dragon.  Despite best efforts to proofread, errors can occur which can change the meaning. Any transcriptional errors that result from this process are unintentional and may not be fully corrected at the time of dictation.

## 2021-07-05 ENCOUNTER — Other Ambulatory Visit: Payer: Self-pay | Admitting: Internal Medicine

## 2021-07-09 ENCOUNTER — Other Ambulatory Visit: Payer: Self-pay | Admitting: Pulmonary Disease

## 2021-07-14 ENCOUNTER — Ambulatory Visit (INDEPENDENT_AMBULATORY_CARE_PROVIDER_SITE_OTHER): Payer: Managed Care, Other (non HMO)

## 2021-07-14 ENCOUNTER — Telehealth: Payer: Self-pay | Admitting: Pulmonary Disease

## 2021-07-14 ENCOUNTER — Emergency Department
Admission: EM | Admit: 2021-07-14 | Discharge: 2021-07-14 | Discharge status: Against Medical Advice | Payer: Managed Care, Other (non HMO)

## 2021-07-14 ENCOUNTER — Ambulatory Visit
Admission: EM | Admit: 2021-07-14 | Discharge: 2021-07-14 | Disposition: A | Discharge status: Home / Self Care | Payer: Managed Care, Other (non HMO) | Attending: Urgent Care | Admitting: Urgent Care

## 2021-07-14 ENCOUNTER — Encounter: Payer: Self-pay | Admitting: Emergency Medicine

## 2021-07-14 DIAGNOSIS — R079 Chest pain, unspecified: Secondary | ICD-10-CM

## 2021-07-14 DIAGNOSIS — R9431 Abnormal electrocardiogram [ECG] [EKG]: Secondary | ICD-10-CM

## 2021-07-14 DIAGNOSIS — R918 Other nonspecific abnormal finding of lung field: Secondary | ICD-10-CM

## 2021-07-14 NOTE — ED Triage Notes (Signed)
Patient c/o mid back pain x 3 days, no injury.  Patient woke up with the pain.  Patient is currently being treated for pneumococcal pneumonia which he takes 3 days a week.  It hurts to take a deep breath.  Patient denies any meds for pain.

## 2021-07-14 NOTE — Telephone Encounter (Signed)
Spoke to patient.  He reports of sharpe pain on right side of back under shoulder blade that radiates into chest. He stated that patient worsens with each breath.  C/o SOB with exertion, dry cough and wheezing x3d. Prednisone 20mg  daily and duoneb TID.   Dr. Patsey Berthold, please advise. thanks

## 2021-07-14 NOTE — ED Notes (Signed)
Patient is being discharged from the Urgent Care and sent to the Emergency Department via EMS . Per Jearld Adjutant patient is in need of higher level of care due to further evaluation. Patient is aware and verbalizes understanding of plan of care.  Vitals:   07/14/21 1347  BP: 115/81  Pulse: 97  Resp: 20  Temp: 97.9 F (36.6 C)  SpO2: 97%

## 2021-07-14 NOTE — Telephone Encounter (Signed)
Patient is aware below message/recommendations. He voiced his understanding.  He stated that he would report to ED. He stated that he resumed prednisone about 30 days ago.  Nothing further needed.
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
## 2021-07-14 NOTE — Discharge Instructions (Addendum)
Given your presentation, I feel you need further care than the urgent care setting can provide. Your EKG has new concerning findings compared to that in March. Given your history of a thoracic aortic aneurysm, we must rule out further vascular conditions. Your chest xray shows significant consolidation to the R middle lobe, concerning for continued infection vs neoplasm. Please head to ER via EMS for further workup.

## 2021-07-14 NOTE — Telephone Encounter (Signed)
It looks like he started taking prednisone on his own after he had discontinued it.  I recommend that he go to the emergency room as he is going to need imaging to determine cause of the pain.

## 2021-07-23 ENCOUNTER — Other Ambulatory Visit: Payer: Self-pay | Admitting: Internal Medicine

## 2021-07-25 ENCOUNTER — Encounter: Payer: Self-pay | Admitting: Pulmonary Disease

## 2021-07-25 ENCOUNTER — Telehealth: Payer: Self-pay

## 2021-07-25 ENCOUNTER — Ambulatory Visit (INDEPENDENT_AMBULATORY_CARE_PROVIDER_SITE_OTHER): Payer: Managed Care, Other (non HMO) | Admitting: Pulmonary Disease

## 2021-07-25 VITALS — BP 122/60 | HR 88 | Temp 97.8°F | Ht 65.0 in | Wt 105.2 lb

## 2021-07-25 DIAGNOSIS — G4736 Sleep related hypoventilation in conditions classified elsewhere: Secondary | ICD-10-CM

## 2021-07-25 DIAGNOSIS — F17210 Nicotine dependence, cigarettes, uncomplicated: Secondary | ICD-10-CM

## 2021-07-25 DIAGNOSIS — J439 Emphysema, unspecified: Secondary | ICD-10-CM

## 2021-07-25 DIAGNOSIS — J449 Chronic obstructive pulmonary disease, unspecified: Secondary | ICD-10-CM

## 2021-07-25 DIAGNOSIS — J84116 Cryptogenic organizing pneumonia: Secondary | ICD-10-CM

## 2021-07-25 DIAGNOSIS — I5022 Chronic systolic (congestive) heart failure: Secondary | ICD-10-CM

## 2021-07-25 DIAGNOSIS — R1310 Dysphagia, unspecified: Secondary | ICD-10-CM

## 2021-07-25 DIAGNOSIS — T17908S Unspecified foreign body in respiratory tract, part unspecified causing other injury, sequela: Secondary | ICD-10-CM

## 2021-07-25 MED ORDER — AZITHROMYCIN 500 MG PO TABS: ORAL_TABLET | ORAL | 2 refills | Status: DC

## 2021-07-25 MED ORDER — PREDNISONE 10 MG PO TABS: 10.0000 mg | ORAL_TABLET | Freq: Every day | ORAL | 2 refills | Status: DC

## 2021-07-25 NOTE — Telephone Encounter (Addendum)
Spoke to [facility] radiology and requested for CT to be uploaded to pacs. I was advised that a request needed to be made through power share. I do not have access to power share. Spoke to [place] with [facility] radiology. He stated that he would investigate this process, as he has not had to request images from [facility] before. He recommended that I contact patient and request that he contact [facility] radiology department and obtain disc.    Lm for patient.

## 2021-07-25 NOTE — Telephone Encounter (Signed)
Spoke to patient and relayed below message.  He stated that he would obtain disc and call back once he has it.  Nothing further needed.

## 2021-07-25 NOTE — Progress Notes (Unsigned)
Subjective:    Patient ID: Clinton Walsh, male    DOB: 15-Nov-1960, 61 y.o.   MRN: [id number] Patient Care Team: Pcp, No as PCP - General End, Harrell Gave, MD as PCP - Cardiology (Cardiology) Flora Lipps, MD as Consulting Physician (Pulmonary Disease) End, Harrell Gave, MD as Consulting Physician (Cardiology) Delana Meyer, Dolores Lory, MD (Vascular Surgery)  Chief Complaint  Patient presents with   Follow-up    Increased SOB with exertion, prod cough with yellow sputum and wheezing.     HPI This is a very complex 61 year old current smoker (0.5 PPD, 71 PY) with a history as noted below, who presents for follow-up on a cavitary process on the right lung initially noted with necrotizing pneumonia and noted to be necrotizing pneumonia with granulomatous inflammation on biopsy.  A definitive call on carcinoma could not be made due to severe inflammatory change.  Patient presented to urgent care on 14 July because of worsening symptoms of cough productive of yellowish to greenish sputum.  No hemoptysis.  He also developed pleuritic chest pain he was referred to the ED from urgent care whereupon he left AMA then presented himself to [facility] emergency department he had CTA chest there that showed extensive emphysema consolidation on the posterior segment of the right upper lobe, middle lobe and right lower lobe consistent with bronchopneumonia or aspiration.  He also had a previously noted impacted airway on the left upper lobe.  These findings may represent progression of his underlying issues.  The patient is disgruntled as he cannot seem to get better.  He continues to smoke.  He does endorse dysphagia and significant reflux symptoms.  He has had prior percutaneous biopsy of the process on the right lung.  Mostly a lot of necrotizing pneumonia and granulomatous inflammation.  Patient has not been enthusiastic about repeat PET/CT and biopsy.  He is not a candidate for general anesthesia for advanced  bronchoscopic procedures.  After evaluation in the emergency room at [facility], he was discharged on cefdinir 600 mg daily for 10 days.  He is currently still taking the medication.  We do not have access to the CT performed at [facility] however we are procuring this.  Patient notes that since starting the antibiotics his pleuritic chest pain has gotten better.  He does endorse dysphagia and significant reflux as noted above, he has terrible dentition.  Recall that he has been on Protonix for dyspepsia.  He is currently out of Azithromycin which he takes 3 times a week.  Continues to take prednisone 20 mg daily.   DATA 10/09/2006 through 01/06/2020 multiple CT chest: Reviewed 09/20/2016 spirometry/DLCO: FEV1 0.99 L or 35% predicted, FVC 3.52 L or 99% predicted FEV1/FVC 28%, no bronchodilator response.  Diffusion capacity severely impaired at 28% however only 1 measurement obtained and could not obtain lung volumes due to patient's severe shortness of breath. 01/04/2021 CT chest with contrast: Thick-walled cavitary lesion in the dependent right lower lobe measuring 7.2 x 3.6 cm, extensive clustered centrilobular nodularity and heterogeneous airspace consolidation throughout the right middle lobe and right lower lobe suggestive of infection/aspiration however will need follow-up as malignancy cannot be excluded.  0.6 cm spiculated nodule in the right upper lobe unchanged from prior, unchanged left upper lobe findings 02/16/2021 PET/CT: Irregular and cavitary area of posterior right lower lobe significant FDG uptake maximum SUV 9.8, biopsy suggested, other areas of less hypermetabolic change in the right middle lobe consistent with pneumonitis, stable left upper lobe nodule without substantial metabolic activity.  03/07/2021 CT-guided core needle biopsy: LUNG, RIGHT LOWER LOBE; CT-GUIDED CORE NEEDLE BIOPSY, SUSPICIOUS FOR MALIGNANCY. FOCAL PNEUMOCYTE ATYPIA, IN A BACKGROUND OF ORGANIZING PNEUMONIA AND  NON-NECROTIZING GRANULOMATOUS INFLAMMATION 05/15/2021 CT chest: Persistent residual cavitary lesion posterior right and upper lower lobes with reduction on the infection/inflammation from prior.  Though the findings are persistent they are overall improving with regards to inflammatory change.  Review of Systems A 10 point review of systems was performed and it is as noted above otherwise negative.  Patient Active Problem List   Diagnosis Date Noted   Muscle cramps 05/12/2021   Dental caries 05/12/2021   Infrarenal abdominal aortic aneurysm (AAA) without rupture (HCC) 05/04/2021   COPD (chronic obstructive pulmonary disease) ([place]) 02/24/2021   Cavitating mass in right lower lung lobe 02/24/2021   Urinary retention 12/17/2019   Unstable angina ([place]) 12/03/2019   Thoracic aortic aneurysm without rupture ([place]) 09/17/2019   Essential hypertension 09/17/2019   Chronic respiratory failure with hypoxia ([employer]) 06/15/2019   Pneumonia 05/29/2019   Pulmonary emphysema ([place]) 12/01/2018   PVC's (premature ventricular contractions) 11/20/2018   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[place]) 06/19/2018   AAA (abdominal aortic aneurysm) without rupture (HCC) 06/19/2018   Abdominal pain, epigastric 06/19/2018   TIA (transient ischemic attack) 10/23/2017   Stable angina (HCC) 07/04/2016   Chronic HFrEF (heart failure with reduced ejection fraction) ([place]) 06/07/2016   Ischemic cardiomyopathy 04/25/2016   Pulmonary nodules 04/25/2016   Abnormal CT of the chest 04/19/2016   Aortic dilatation ([place]) 04/19/2016   STEMI involving right coronary artery ([place]) 04/10/2016   Panlobular emphysema ([place]) 02/02/2015   Gastro-esophageal reflux disease without esophagitis 02/02/2015   Coronary artery disease of native artery of native heart with stable angina pectoris ([place]) 02/02/2015   Polyarthralgia 02/02/2015   Tobacco abuse 02/02/2015   Dyspnea 10/16/2011   NSTEMI (non-ST elevated myocardial infarction) ([place])  03/18/2011   Hyperlipidemia LDL goal <70 03/18/2011   History of multiple pulmonary nodules 03/18/2011   Tobacco use disorder 03/18/2011   Bradycardia 03/18/2011   Social History   Tobacco Use   Smoking status: Every Day    Packs/day: 1.50    Years: 30.00    Total pack years: 45.00    Types: E-cigarettes, Cigarettes   Smokeless tobacco: Never   Tobacco comments:    0.5PPD 07/25/2021  Substance Use Topics   Alcohol use: No    Alcohol/week: 0.0 standard drinks of alcohol    Comment: none x 10y   Allergies  Allergen Reactions   Sulfa Antibiotics Rash   Current Meds  Medication Sig   amLODipine (NORVASC) 2.5 MG tablet TAKE 1 TABLET BY MOUTH EVERY DAY   aspirin EC 81 MG tablet Take 1 tablet (81 mg total) by mouth daily. Swallow whole.   atorvastatin (LIPITOR) 80 MG tablet TAKE 1 TABLET BY MOUTH EVERY DAY   azithromycin (ZITHROMAX) 500 MG tablet Take 1 tablet Monday Wednesday and Fridays   cefdinir (OMNICEF) 300 MG capsule Take 600 mg by mouth daily.   clopidogrel (PLAVIX) 75 MG tablet Take 75 mg by mouth daily.   ipratropium-albuterol (DUONEB) 0.5-2.5 (3) MG/3ML SOLN QID AND PRN   losartan (COZAAR) 25 MG tablet TAKE 1 TABLET BY MOUTH EVERY DAY   nitroGLYCERIN (NITROSTAT) 0.4 MG SL tablet Place 1 tablet (0.4 mg total) under the tongue every 5 (five) minutes as needed for chest pain.   OXYGEN Inhale 2 L into the lungs at bedtime.   pantoprazole (PROTONIX) 40 MG tablet Take 1 tablet (40  mg total) by mouth daily.   predniSONE (DELTASONE) 10 MG tablet Take 1 tablet (10 mg total) by mouth daily with breakfast.   [DISCONTINUED] azithromycin (ZITHROMAX) 250 MG tablet TAKE 1 TABLET BY MOUTH EVERY M, W AND F   [DISCONTINUED] predniSONE (DELTASONE) 20 MG tablet TAKE 1 TABLET BY MOUTH EVERY DAY WITH BREAKFAST   There is no immunization history for the selected administration types on file for this patient.     Objective:   Physical Exam BP 122/60 (BP Location: Left Arm, Cuff Size:  Normal)   Pulse 88   Temp 97.8 F (36.6 C) (Temporal)   Ht 5\' 5"  (1.651 m)   Wt 105 lb 3.2 oz (47.7 kg)   SpO2 95%   BMI 17.51 kg/m  GENERAL: Thin/cachectic gentleman, very depressed/flat affect.  Fully ambulatory, no conversational dyspnea. Disheveled, looks much older than stated age. HEAD: Normocephalic, atraumatic.  EYES: Pupils equal, round, reactive to light.  No scleral icterus.  MOUTH: Terrible dentition.  No thrush NECK: Supple. No thyromegaly. Trachea midline. No JVD.  No adenopathy. PULMONARY: Very distant breath sounds.  Poor air movement, no adventitious sounds. CARDIOVASCULAR: S1 and S2. Regular rate and rhythm.  Very distant heart tones, no overt murmurs rubs or gallops heard. ABDOMEN: Scaphoid, otherwise benign. MUSCULOSKELETAL: No joint deformity, no clubbing, no edema.  NEUROLOGIC: Psychomotor retardation noted, no focal signs.  No gait disturbance.  Speech is fluent. SKIN: Intact,warm,dry. PSYCH: Flat affect.  Irascible otherwise.  BODE Index 7     Assessment & Plan:     ICD-10-CM   1. Stage 4 very severe COPD by GOLD classification ([place])  J44.9    Continue nebulization treatments Therapeutic interventions limited due to very severe COPD Counseled with regards to discontinuation of smoking    2. Cryptogenic organizing pneumonia [facility])  J84.116    This issue adds complexity to his management Decrease prednisone to 10 mg daily Continue azithromycin at 500 mg q. MWF Will likely need repeat PET/CT/biopsy    3. Aspiration into respiratory tract, sequela  T17.908S DG ESOPHAGUS W SINGLE CM (SOL OR THIN BA)   Swallow evaluation    4. Dysphagia, unspecified type  R13.10    Swallow evaluation    5. Nocturnal hypoxemia due to emphysema (HCC)  J43.9    G47.36    Compliant with nocturnal oxygen Continue supplementation at 2 L nocturnally     6. Chronic HFrEF (heart failure with reduced ejection fraction) (HCC)  I50.22    This issue adds complexity to his  management Follows with cardiology LVEF 35%, currently compensated    7. Tobacco dependence due to cigarettes  F17.210    Patient counseled regards to discontinuation of smoking Patient currently not committed to quit     Orders Placed This Encounter  Procedures   DG ESOPHAGUS W SINGLE CM (SOL OR THIN BA)    Standing Status:   Future    Standing Expiration Date:   01/25/2022    Order Specific Question:   Reason for Exam (SYMPTOM  OR DIAGNOSIS REQUIRED)    Answer:   Aspiration    Order Specific Question:   Preferred imaging location?    Answer:    [facility]    Order Specific Question:   Radiology Contrast Protocol - do NOT remove file path    Answer:   [email or web address]   Meds ordered this encounter  Medications   azithromycin (ZITHROMAX) 500 MG tablet    Sig: Take 1 tablet Monday Wednesday and Fridays  Dispense:  12 tablet    Refill:  2   predniSONE (DELTASONE) 10 MG tablet    Sig: Take 1 tablet (10 mg total) by mouth daily with breakfast.    Dispense:  30 tablet    Refill:  2   I had a frank discussion with Clinton Walsh.  His lung disease is quite advanced.  It has been difficult to sort out the issues with his lung findings on CT which have improved to some extent but not completely.  Biopsy was suspicious of few atypical cells but not conclusive of malignancy due to the extensive inflammatory and necrotizing process noted.  We will continue to treat for infectious etiology which was noted on most recent CT at [facility] by report (potential aspiration).  The patient will likely need rebiopsy once inflammatory/infectious changes subside.  Will evaluate dysphagia and potential aspiration with swallow evaluation.  He is not enthusiastic about this because he does not want to "be poked on any further" as per prior conversations.  We will repeat CT scan after antibiotic therapy is completed and after swallow evaluation.   Follow-up here in 6 to 8 weeks time however we will stay in contact with him with results of testing prior to that time.  Renold Don, MD Advanced Bronchoscopy PCCM [facility]-    *This note was dictated using voice recognition software/Dragon.  Despite best efforts to proofread, errors can occur which can change the meaning. Any transcriptional errors that result from this process are unintentional and may not be fully corrected at the time of dictation.

## 2021-07-25 NOTE — Patient Instructions (Addendum)
We have increased the dose of your Azithromycin to 500 mg Monday Wednesday and Fridays.  We have decreased the prednisone to 10 mg daily.  We are ordering a swallow evaluation.  We are going to repeat a CT scan in 4 to 6 weeks, follow-up with [place] in 6 to 8 weeks either me or the nurse practitioner.

## 2021-08-01 ENCOUNTER — Inpatient Hospital Stay: Admission: RE | Admit: 2021-08-01 | Payer: Managed Care, Other (non HMO) | Source: Ambulatory Visit

## 2021-08-01 ENCOUNTER — Telehealth: Payer: Self-pay | Admitting: Pulmonary Disease

## 2021-08-01 NOTE — Telephone Encounter (Signed)
Tammy, please advise. thanks

## 2021-08-01 NOTE — Telephone Encounter (Signed)
Patient no showed for radiology appointment  Please clarify with Dr. Patsey Berthold if she wants esophagram or Modified Barium swallow ?  Will need to call patient once order is clarified and reschedule.

## 2021-08-01 NOTE — Telephone Encounter (Signed)
Will await Dr. Domingo Dimes response. She is out of the office until 08/02/2021

## 2021-08-02 NOTE — Telephone Encounter (Signed)
He just needs a simple barium swallow just to exclude things like strictures etc.

## 2021-08-02 NOTE — Telephone Encounter (Signed)
Pam with radiology is aware of below message and voiced her understanding.  Spoke to patient and made him aware of need for barium swallow. He is willing to reschedule.  Rodena Piety, please contact patient to reschedule. Thanks

## 2021-08-04 ENCOUNTER — Other Ambulatory Visit: Payer: Self-pay | Admitting: Pulmonary Disease

## 2021-08-04 ENCOUNTER — Inpatient Hospital Stay: Payer: Managed Care, Other (non HMO)

## 2021-08-04 ENCOUNTER — Emergency Department: Payer: Managed Care, Other (non HMO)

## 2021-08-04 ENCOUNTER — Inpatient Hospital Stay
Admission: EM | Admit: 2021-08-04 | Discharge: 2021-08-07 | DRG: 175 | Disposition: A | Discharge status: Home / Self Care | Payer: Managed Care, Other (non HMO) | Attending: Internal Medicine | Admitting: Internal Medicine

## 2021-08-04 ENCOUNTER — Other Ambulatory Visit: Payer: Self-pay

## 2021-08-04 DIAGNOSIS — I5042 Chronic combined systolic (congestive) and diastolic (congestive) heart failure: Secondary | ICD-10-CM | POA: Diagnosis present

## 2021-08-04 DIAGNOSIS — Z7902 Long term (current) use of antithrombotics/antiplatelets: Secondary | ICD-10-CM | POA: Diagnosis not present

## 2021-08-04 DIAGNOSIS — I716 Thoracoabdominal aortic aneurysm, without rupture, unspecified: Secondary | ICD-10-CM | POA: Diagnosis present

## 2021-08-04 DIAGNOSIS — Z8673 Personal history of transient ischemic attack (TIA), and cerebral infarction without residual deficits: Secondary | ICD-10-CM

## 2021-08-04 DIAGNOSIS — J431 Panlobular emphysema: Secondary | ICD-10-CM | POA: Diagnosis present

## 2021-08-04 DIAGNOSIS — R1310 Dysphagia, unspecified: Secondary | ICD-10-CM | POA: Diagnosis present

## 2021-08-04 DIAGNOSIS — Z7982 Long term (current) use of aspirin: Secondary | ICD-10-CM | POA: Diagnosis not present

## 2021-08-04 DIAGNOSIS — J84116 Cryptogenic organizing pneumonia: Secondary | ICD-10-CM | POA: Diagnosis present

## 2021-08-04 DIAGNOSIS — R339 Retention of urine, unspecified: Secondary | ICD-10-CM | POA: Diagnosis present

## 2021-08-04 DIAGNOSIS — Z681 Body mass index (BMI) 19 or less, adult: Secondary | ICD-10-CM

## 2021-08-04 DIAGNOSIS — I700 Atherosclerosis of aorta: Secondary | ICD-10-CM | POA: Diagnosis present

## 2021-08-04 DIAGNOSIS — F17210 Nicotine dependence, cigarettes, uncomplicated: Secondary | ICD-10-CM | POA: Diagnosis present

## 2021-08-04 DIAGNOSIS — R630 Anorexia: Secondary | ICD-10-CM | POA: Diagnosis present

## 2021-08-04 DIAGNOSIS — I82441 Acute embolism and thrombosis of right tibial vein: Secondary | ICD-10-CM | POA: Diagnosis not present

## 2021-08-04 DIAGNOSIS — Z20822 Contact with and (suspected) exposure to covid-19: Secondary | ICD-10-CM | POA: Diagnosis present

## 2021-08-04 DIAGNOSIS — R1319 Other dysphagia: Secondary | ICD-10-CM | POA: Diagnosis not present

## 2021-08-04 DIAGNOSIS — Z8249 Family history of ischemic heart disease and other diseases of the circulatory system: Secondary | ICD-10-CM

## 2021-08-04 DIAGNOSIS — Z9981 Dependence on supplemental oxygen: Secondary | ICD-10-CM | POA: Diagnosis not present

## 2021-08-04 DIAGNOSIS — J9611 Chronic respiratory failure with hypoxia: Secondary | ICD-10-CM | POA: Diagnosis present

## 2021-08-04 DIAGNOSIS — E785 Hyperlipidemia, unspecified: Secondary | ICD-10-CM | POA: Diagnosis present

## 2021-08-04 DIAGNOSIS — F17290 Nicotine dependence, other tobacco product, uncomplicated: Secondary | ICD-10-CM | POA: Diagnosis present

## 2021-08-04 DIAGNOSIS — R1312 Dysphagia, oropharyngeal phase: Secondary | ICD-10-CM

## 2021-08-04 DIAGNOSIS — J189 Pneumonia, unspecified organism: Secondary | ICD-10-CM | POA: Diagnosis present

## 2021-08-04 DIAGNOSIS — I2699 Other pulmonary embolism without acute cor pulmonale: Secondary | ICD-10-CM | POA: Diagnosis present

## 2021-08-04 DIAGNOSIS — Z955 Presence of coronary angioplasty implant and graft: Secondary | ICD-10-CM

## 2021-08-04 DIAGNOSIS — Z79899 Other long term (current) drug therapy: Secondary | ICD-10-CM

## 2021-08-04 DIAGNOSIS — I82401 Acute embolism and thrombosis of unspecified deep veins of right lower extremity: Secondary | ICD-10-CM | POA: Diagnosis present

## 2021-08-04 DIAGNOSIS — I959 Hypotension, unspecified: Secondary | ICD-10-CM | POA: Diagnosis present

## 2021-08-04 DIAGNOSIS — I252 Old myocardial infarction: Secondary | ICD-10-CM

## 2021-08-04 DIAGNOSIS — R079 Chest pain, unspecified: Principal | ICD-10-CM

## 2021-08-04 DIAGNOSIS — I2609 Other pulmonary embolism with acute cor pulmonale: Secondary | ICD-10-CM | POA: Diagnosis present

## 2021-08-04 DIAGNOSIS — R636 Underweight: Secondary | ICD-10-CM | POA: Diagnosis present

## 2021-08-04 DIAGNOSIS — I2510 Atherosclerotic heart disease of native coronary artery without angina pectoris: Secondary | ICD-10-CM | POA: Diagnosis present

## 2021-08-04 DIAGNOSIS — Z91013 Allergy to seafood: Secondary | ICD-10-CM

## 2021-08-04 DIAGNOSIS — Z7952 Long term (current) use of systemic steroids: Secondary | ICD-10-CM

## 2021-08-04 HISTORY — DX: Other pulmonary embolism without acute cor pulmonale: I26.99

## 2021-08-04 LAB — EXPECTORATED SPUTUM ASSESSMENT W GRAM STAIN, RFLX TO RESP C

## 2021-08-04 LAB — CBC
HCT: 39.9 % (ref 39.0–52.0)
Hemoglobin: 13.1 g/dL (ref 13.0–17.0)
MCH: 29.2 pg (ref 26.0–34.0)
MCHC: 32.8 g/dL (ref 30.0–36.0)
MCV: 89.1 fL (ref 80.0–100.0)
Platelets: 457 10*3/uL — ABNORMAL HIGH (ref 150–400)
RBC: 4.48 MIL/uL (ref 4.22–5.81)
RDW: 13.6 % (ref 11.5–15.5)
WBC: 10.7 10*3/uL — ABNORMAL HIGH (ref 4.0–10.5)
nRBC: 0 % (ref 0.0–0.2)

## 2021-08-04 LAB — PROTIME-INR
INR: 1.3 — ABNORMAL HIGH (ref 0.8–1.2)
Prothrombin Time: 16.1 seconds — ABNORMAL HIGH (ref 11.4–15.2)

## 2021-08-04 LAB — HEPARIN LEVEL (UNFRACTIONATED): Heparin Unfractionated: 0.25 IU/mL — ABNORMAL LOW (ref 0.30–0.70)

## 2021-08-04 LAB — BASIC METABOLIC PANEL
Anion gap: 12 (ref 5–15)
BUN: 24 mg/dL — ABNORMAL HIGH (ref 6–20)
CO2: 23 mmol/L (ref 22–32)
Calcium: 8.6 mg/dL — ABNORMAL LOW (ref 8.9–10.3)
Chloride: 103 mmol/L (ref 98–111)
Creatinine, Ser: 0.91 mg/dL (ref 0.61–1.24)
GFR, Estimated: >60 mL/min (ref >60)
Glucose, Bld: 98 mg/dL (ref 70–99)
Potassium: 3.8 mmol/L (ref 3.5–5.1)
Sodium: 138 mmol/L (ref 135–145)

## 2021-08-04 LAB — LACTIC ACID, PLASMA
Lactic Acid, Venous: 2.4 mmol/L (ref 0.5–1.9)
Lactic Acid, Venous: 1.6 mmol/L (ref 0.5–1.9)

## 2021-08-04 LAB — APTT: aPTT: 33 seconds (ref 24–36)

## 2021-08-04 LAB — HIV ANTIBODY (ROUTINE TESTING W REFLEX): HIV Screen 4th Generation wRfx: NONREACTIVE

## 2021-08-04 LAB — SARS CORONAVIRUS 2 BY RT PCR: SARS Coronavirus 2 by RT PCR: NEGATIVE

## 2021-08-04 LAB — PROCALCITONIN: Procalcitonin: <0.1 ng/mL

## 2021-08-04 LAB — TROPONIN I (HIGH SENSITIVITY)
Troponin I (High Sensitivity): 9 ng/L (ref <18)
Troponin I (High Sensitivity): 8 ng/L (ref <18)

## 2021-08-04 MED ORDER — HEPARIN (PORCINE) 25000 UT/250ML-% IV SOLN
1100.0000 [IU]/h | INTRAVENOUS | Status: DC
  Administered 2021-08-04: 1000 [IU]/h via INTRAVENOUS
  Filled 2021-08-04: qty 250

## 2021-08-04 MED ORDER — IPRATROPIUM-ALBUTEROL 0.5-2.5 (3) MG/3ML IN SOLN
3.0000 mL | Freq: Once | RESPIRATORY_TRACT | Status: AC
  Administered 2021-08-04: 3 mL via RESPIRATORY_TRACT
  Filled 2021-08-04: qty 3

## 2021-08-04 MED ORDER — OXYCODONE-ACETAMINOPHEN 5-325 MG PO TABS
1.0000 | ORAL_TABLET | Freq: Four times a day (QID) | ORAL | Status: AC | PRN
  Administered 2021-08-05 (×3): 1 via ORAL
  Filled 2021-08-04 (×3): qty 1

## 2021-08-04 MED ORDER — VANCOMYCIN HCL IN DEXTROSE 1-5 GM/200ML-% IV SOLN
1000.0000 mg | Freq: Once | INTRAVENOUS | Status: AC
  Administered 2021-08-04: 1000 mg via INTRAVENOUS
  Filled 2021-08-04: qty 200

## 2021-08-04 MED ORDER — SODIUM CHLORIDE 0.9 % IV SOLN: INTRAVENOUS | Status: DC | PRN

## 2021-08-04 MED ORDER — FENTANYL CITRATE PF 50 MCG/ML IJ SOSY
50.0000 ug | PREFILLED_SYRINGE | Freq: Once | INTRAMUSCULAR | Status: AC
  Administered 2021-08-04: 50 ug via INTRAVENOUS
  Filled 2021-08-04: qty 1

## 2021-08-04 MED ORDER — CEFEPIME HCL 2 G IV SOLR
2.0000 g | Freq: Once | INTRAVENOUS | Status: AC
  Administered 2021-08-04: 2 g via INTRAVENOUS
  Filled 2021-08-04: qty 12.5

## 2021-08-04 MED ORDER — ONDANSETRON HCL 4 MG/2ML IJ SOLN: 4.0000 mg | Freq: Four times a day (QID) | INTRAMUSCULAR | Status: DC | PRN

## 2021-08-04 MED ORDER — HEPARIN BOLUS VIA INFUSION
4000.0000 [IU] | Freq: Once | INTRAVENOUS | Status: AC
  Administered 2021-08-04: 4000 [IU] via INTRAVENOUS
  Filled 2021-08-04: qty 4000

## 2021-08-04 MED ORDER — SODIUM CHLORIDE 0.9 % IV BOLUS
1000.0000 mL | Freq: Once | INTRAVENOUS | Status: AC
  Administered 2021-08-04: 1000 mL via INTRAVENOUS

## 2021-08-04 MED ORDER — IOHEXOL 350 MG/ML SOLN
75.0000 mL | Freq: Once | INTRAVENOUS | Status: AC | PRN
Start: 2021-08-04 — End: 2021-08-04
  Administered 2021-08-04: 75 mL via INTRAVENOUS

## 2021-08-04 MED ORDER — OXYCODONE-ACETAMINOPHEN 5-325 MG PO TABS
1.0000 | ORAL_TABLET | Freq: Once | ORAL | Status: AC
  Administered 2021-08-04: 1 via ORAL
  Filled 2021-08-04: qty 1

## 2021-08-04 MED ORDER — ACETAMINOPHEN 325 MG PO TABS
650.0000 mg | ORAL_TABLET | Freq: Four times a day (QID) | ORAL | Status: DC | PRN
Start: 2021-08-04 — End: 2021-08-07
  Administered 2021-08-06: 650 mg via ORAL
  Filled 2021-08-04 (×3): qty 2

## 2021-08-04 MED ORDER — SODIUM CHLORIDE 0.9 % IV SOLN
2.0000 g | INTRAVENOUS | Status: DC
  Administered 2021-08-04 – 2021-08-06 (×3): 2 g via INTRAVENOUS
  Filled 2021-08-04 (×2): qty 20
  Filled 2021-08-04 (×2): qty 2

## 2021-08-04 MED ORDER — IPRATROPIUM-ALBUTEROL 0.5-2.5 (3) MG/3ML IN SOLN
3.0000 mL | Freq: Four times a day (QID) | RESPIRATORY_TRACT | Status: DC | PRN
  Administered 2021-08-05: 3 mL via RESPIRATORY_TRACT
  Filled 2021-08-04: qty 3

## 2021-08-04 MED ORDER — PANTOPRAZOLE SODIUM 40 MG PO TBEC
40.0000 mg | DELAYED_RELEASE_TABLET | Freq: Every day | ORAL | Status: DC
  Administered 2021-08-05 – 2021-08-07 (×3): 40 mg via ORAL
  Filled 2021-08-04 (×3): qty 1

## 2021-08-04 MED ORDER — ASPIRIN 81 MG PO TBEC
81.0000 mg | DELAYED_RELEASE_TABLET | Freq: Every day | ORAL | Status: DC
  Administered 2021-08-05 – 2021-08-07 (×3): 81 mg via ORAL
  Filled 2021-08-04 (×3): qty 1

## 2021-08-04 MED ORDER — CLOPIDOGREL BISULFATE 75 MG PO TABS
75.0000 mg | ORAL_TABLET | Freq: Every day | ORAL | Status: DC
  Administered 2021-08-05: 75 mg via ORAL
  Filled 2021-08-04: qty 1

## 2021-08-04 MED ORDER — ATORVASTATIN CALCIUM 80 MG PO TABS
80.0000 mg | ORAL_TABLET | Freq: Every day | ORAL | Status: DC
  Administered 2021-08-05 – 2021-08-07 (×3): 80 mg via ORAL
  Filled 2021-08-04 (×3): qty 1

## 2021-08-04 MED ORDER — ONDANSETRON HCL 4 MG/2ML IJ SOLN
4.0000 mg | Freq: Once | INTRAMUSCULAR | Status: AC
  Administered 2021-08-04: 4 mg via INTRAVENOUS
  Filled 2021-08-04: qty 2

## 2021-08-04 NOTE — ED Notes (Signed)
Pt states he wants a regular diet and not a heart-health diet.
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
## 2021-08-04 NOTE — ED Provider Notes (Addendum)
I assumed care of this patient approximately 1500.  Please see outgoing providers note for full details regarding patient's initial evaluation and assessment.  In brief patient presents with history of ongoing tobacco abuse and COPD on oxygen at night as well as extensive cardiac history including previous stents on clopidogrel and aspirin for evaluation of some right-sided chest pain.  He is on azithromycin and steroids for pneumonia and followed by pulmonology.  Prior to signout he had chest x-ray obtained that showed known right-sided mass concerning for pneumonia versus malignancy.  CTA ordered.  Plan is to follow-up CTA.  EKG remarkable sinus rhythm with a left anterior fascicle block without other clearance of acute ischemia.  Initial troponin not elevated.  BMP without significant lecture light or metabolic derangements.  CBC with WC count of 10.7 with normal hemoglobin and platelets of 457.  Initial gas slightly elevated 2.4.  CTA chest on my interpretation does show right-sided upper and lower lobe pneumonia.  I discussed and reviewed with reading radiologist and agree with findings of 2 small pulmonary emboli in the peripheral upper and middle lobes without evidence of right heart strain as well as a 4 cm stable ascending thoracic aortic aneurysm as well as CAD, aortic atherosclerosis and emphysema.  Updated patient family bedside on these findings.  We will start on heparin for these PEs.  Also started on broad-spectrum attics with concerns for possible necrotizing pneumonia.  Did speak with his pulmonologist Dr. Patsey Berthold he also recommended that while patient is inpatient he undergo swallow exam.  She is concerned he may be aspirating.  I will admit to medicine service for further evaluation and management.  .Critical Care  Performed by: Lucrezia Starch, MD Authorized by: Lucrezia Starch, MD   Critical care provider statement:    Critical care time (minutes):  30   Critical care was  necessary to treat or prevent imminent or life-threatening deterioration of the following conditions:  Circulatory failure   Critical care was time spent personally by me on the following activities:  Development of treatment plan with patient or surrogate, discussions with consultants, evaluation of patient's response to treatment, examination of patient, ordering and review of laboratory studies, ordering and review of radiographic studies, ordering and performing treatments and interventions, pulse oximetry, re-evaluation of patient's condition and review of old charts     Lucrezia Starch, MD 08/04/21 1606    Lucrezia Starch, MD 08/04/21 1607    Lucrezia Starch, MD 08/04/21 [phone]

## 2021-08-04 NOTE — ED Provider Notes (Signed)
[facility] Provider Note    Event Date/Time   First MD Initiated Contact with Patient 08/04/21 1353     (approximate)   History   Chest Pain   HPI  Clinton Walsh is a 61 y.o. male with history of prior MIs, stents who comes in with concerns for chest pain.  Patient reports pain radiating to the right shoulder and jaw.  Patient reportedly being treated for pneumonia he had to stop prednisone due to side effects.  Patient reports that he wears oxygen at nighttime.  He is 93% on room air but does report history of COPD.  Patient got 324 of aspirin and nitro x2.  Patient reports that he developed some pain in the right side of his chest this morning that felt like his prior heart attacks.  He does report some pain in his chest and currently being treated for pneumonia.  I reviewed patient's records where he had a CT PE on 7/17 without evidence of PE but there was concern for pneumonia patient was started on cefdinir which she completed.  He followed up with pulmonary he was concerned about a cryptogenic organizing pneumonia and started on prednisone and azithromycin Monday Wednesday Friday.  He reports being compliant with the azithromycin but not taking the prednisone due to causing some cramping.  He denies any new swelling in his legs.  No calf tenderness.    Physical Exam   Triage Vital Signs: ED Triage Vitals  Enc Vitals Group     BP 08/04/21 1354 106/76     Pulse Rate 08/04/21 1354 95     Resp 08/04/21 1354 18     Temp 08/04/21 1356 98 F (36.7 C)     Temp Source 08/04/21 1356 Oral     SpO2 08/04/21 1354 92 %     Weight --      Height --      Head Circumference --      Peak Flow --      Pain Score 08/04/21 1405 9     Pain Loc --      Pain Edu? --      Excl. in [place]? --     Most recent vital signs: Vitals:   08/04/21 1356 08/04/21 1405  BP:    Pulse:    Resp:    Temp: 98 F (36.7 C)   SpO2:  93%     General: Awake, no distress.   CV:  Good peripheral perfusion.  Resp:  Normal effort.  On 2 L, tight lungs Abd:  No distention.  Other:  No swelling, no calf tenderness    ED Results / Procedures / Treatments   Labs (all labs ordered are listed, but only abnormal results are displayed) Labs Reviewed  BASIC METABOLIC PANEL  CBC  TROPONIN I (HIGH SENSITIVITY)     EKG  My interpretation of EKG:  Normal sinus rhythm 91 without any ST elevation or T wave inversions none left anterior fascicular block  RADIOLOGY I have reviewed the xray personally and interpreted and patient has a right upper lobe infiltrate  Extensive emphysema with multisegmental consolidation in the right consistent with bronchopneumonia or aspiration. Impacted airway left upper lobe as well. Suggest follow-up to radiographic resolution in 4-6 weeks.   PROCEDURES:  Critical Care performed: No  .1-3 Lead EKG Interpretation  Performed by: Vanessa Hendrix, MD Authorized by: Vanessa , MD     Interpretation: normal     ECG  rate:  90   ECG rate assessment: normal     Rhythm: sinus rhythm     Ectopy: none     Conduction: normal      MEDICATIONS ORDERED IN ED: Medications  ipratropium-albuterol (DUONEB) 0.5-2.5 (3) MG/3ML nebulizer solution 3 mL (3 mLs Nebulization Given 08/04/21 1512)  sodium chloride 0.9 % bolus 1,000 mL (1,000 mLs Intravenous New Bag/Given 08/04/21 1512)  fentaNYL (SUBLIMAZE) injection 50 mcg (50 mcg Intravenous Given 08/04/21 1511)  ondansetron (ZOFRAN) injection 4 mg (4 mg Intravenous Given 08/04/21 1511)  iohexol (OMNIPAQUE) 350 MG/ML injection 75 mL (75 mLs Intravenous Contrast Given 08/04/21 1521)     IMPRESSION / MDM / ASSESSMENT AND PLAN / ED COURSE  I reviewed the triage vital signs and the nursing notes.   Patient's presentation is most consistent with acute presentation with potential threat to life or bodily function.   Differential includes ACS, pneumonia, aspiration.  Lower suspicion for PE given recent  CT PE was negative and no swelling in 1 leg or calf tenderness.  Lactate slightly elevated ordered from triage we will give some fluids.  BMP reassuring white count elevated at 10 troponin is slightly elevated.  Patient had off to oncoming team pending repeat troponin and will get CT PE given x-ray shows worsening consolidation and recommended repeat CT imaging.  Patient dropped oncoming team pending these results  The patient is on the cardiac monitor to evaluate for evidence of arrhythmia and/or significant heart rate changes.      FINAL CLINICAL IMPRESSION(S) / ED DIAGNOSES   Final diagnoses:  None     Rx / DC Orders   ED Discharge Orders     None        Note:  This document was prepared using Dragon voice recognition software and may include unintentional dictation errors.   Vanessa Burney, MD 08/04/21 [phone]

## 2021-08-04 NOTE — ED Triage Notes (Addendum)
Pt c/o central chest pain that radiates to the right shoulder and jaw pain that is worse w/ inspiration. Pt is being treated for pneumonia currently- states he has been taking meds as prescribed but stopped taking prednisone due to side effects of cramping. Pt c/o mild SOB, deines N/V/D, dizziness. Pt is AOX4, NAD noted. Respirations are even and unlabored, skin appears slightly pale, warm and dry. Pt has history of MI x4 and stent x3. Pt states he uses 5-6 liter's oxygen at night only.  BGL 121 93% on RA- hx COPD Nitro x2 gvn 324 ASA gvn 200 Ml's NaCl gvn

## 2021-08-04 NOTE — Consult Note (Signed)
ANTICOAGULATION CONSULT NOTE - Initial Consult  Pharmacy Consult for Heparin infusion Indication: pulmonary embolus  Allergies  Allergen Reactions   Sulfa Antibiotics Rash    Patient Measurements: Height: 5\' 5"  (165.1 cm) Weight: 47.6 kg (105 lb) IBW/kg (Calculated) : 61.5 Heparin Dosing Weight: 60.1 kg  Vital Signs: Temp: 98 F (36.7 C) (08/04 1356) Temp Source: Oral (08/04 1356) BP: 120/67 (08/04 1600) Pulse Rate: 87 (08/04 1645)  Labs: Recent Labs    08/04/21 1355 08/04/21 1514 08/04/21 1603  HGB 13.1  --   --   HCT 39.9  --   --   PLT 457*  --   --   APTT  --   --  33  LABPROT  --   --  16.1*  INR  --   --  1.3*  CREATININE 0.91  --   --   TROPONINIHS 9 8  --     Estimated Creatinine Clearance: 58.8 mL/min (by C-G formula based on SCr of 0.91 mg/dL).   Medical History: Past Medical History:  Diagnosis Date   Abdominal aortic aneurysm (AAA) ([place])    4.3 cm by CT in 07/2018; b. 01/2020 CT: 4.1x4.1cm juxtarenal AAA. Mod-large mural thrombus.   CAD (coronary artery disease)    a.  2008 Cath: nonobs dzs;  b. 03/2011 NSTEMI: 100 D1 (2.25x18 MiniVision BMS);  c. 04/2011 NSTEMI/PCI: D1 100->thrombectomy/PTCA;  d. 04/2016 Inf STEMI: RCA 20m (3.0x34 Resolute Integrity DES); e. 07/2016 PCI: LAD 60 (FFR 0.78-->2.5x23 Xience Alpine DES); 12/2019 PCI: LM nl, LAD 51m, 36m ISR/70d (2.75x26 Resolute Onyx DESp; 2.25x12 Resolute Onyx DESm), LCX 60d, RCA 25p ISR.   HFrEF (heart failure with reduced ejection fraction) ([place])    a. 04/2016 Echo: EF 30-35%, diff HK, Gr1 DD, triv AI, mild to mod MR;  b. 07/2016 LV gram: EF 35-40%; c. 11/2017 Echo: EF 40-45%, Gr2 DD; d. 10/2019 Echo: EF 30-35%, glob HK. GrII DD; e. 11/2020 Echo: EF 35-40%, GrI DD. Low-nl RV fxn. Mild MR/AI.   Hyperlipidemia    Ischemic cardiomyopathy    a. 04/2016 Echo: EF 30-35%, diff HK, Gr1 DD;  b. 07/2016 LV gram: EF 35-40%; c. 11/2017 Echo: EF 40-45%; d. 10/2019 Echo: EF 30-35%, grII DD; e. 11/2020 Echo: EF 35-40%, GrI DD.    Mediastinal adenopathy    a. 04/2017 CT chest: stable mediastinal and hilar lymphadenopathy w/ speckled Ca2+. Suspicious for sarcoidosis.   Polymyalgia rheumatica ([place]) 02/2015   Pulmonary nodules    a. nonspecific nodules in the right middle lobe and right lower lobe by CT; b. 04/2017 CT Chest: Stable RML/RLL nodules - followed by pulm; b. 01/2020 CT Chest: 66mm RUL pulm nodule as well as mediastinal and hilar nodes.   Thoracic ascending aortic aneurysm ([place])    a. 06/2016 CTA: stable 4 cm TAA; b. 01/2020 CT Chest: 4.2cm TAA; c. 01/2021 CTA Chest: 4.1 x 4.1cm TAA.   TIA (transient ischemic attack)    a. 10/2017 possible TIA - transient R sided wkns; b. 11/2017 Carotid U/S: nl.   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 Admitted with chest pain and increased O2 requirements. Pharmacy consulted to manage heparin infusion for new onset PE.  Goal of  Therapy:  Heparin level 0.3-0.7 units/ml Monitor platelets by anticoagulation protocol: Yes   Plan:  Give 4000 units bolus x 1 Start heparin infusion at 1000 units/hr Check anti-Xa level in 6 hours and daily while on heparin Continue to monitor H&H and platelets  Skippy Marhefka Rodriguez-Guzman PharmD, BCPS 08/04/2021 5:48 PM

## 2021-08-04 NOTE — Telephone Encounter (Signed)
I have Clinton Walsh barium swallow rescheduled on 08/11/21 @ 9:00am arrival time 8:30am Nothing to eat or drink for 3 hours prior. When I tried to call the patient about the appt information I see that he is currently admitted

## 2021-08-04 NOTE — Consult Note (Signed)
PHARMACY -  BRIEF ANTIBIOTIC NOTE   Pharmacy has received consult(s) for cefepime and vancomycin from an ED provider.  The patient's profile has been reviewed for ht/wt/allergies/indication/available labs.    One time order(s) placed for  Cefepime 2gm x 1 Vancomycin 1000 mg x1  Further antibiotics/pharmacy consults should be ordered by admitting physician if indicated.                       Thank you, Saajan Willmon Rodriguez-Guzman PharmD, BCPS 08/04/2021 4:12 PM

## 2021-08-04 NOTE — ED Notes (Signed)
2 sets of blood cultures sent to lab at this time.

## 2021-08-04 NOTE — ED Notes (Signed)
EDP notified of lactic of 2.4

## 2021-08-05 DIAGNOSIS — J189 Pneumonia, unspecified organism: Secondary | ICD-10-CM | POA: Diagnosis not present

## 2021-08-05 DIAGNOSIS — I2699 Other pulmonary embolism without acute cor pulmonale: Secondary | ICD-10-CM | POA: Diagnosis not present

## 2021-08-05 DIAGNOSIS — I82401 Acute embolism and thrombosis of unspecified deep veins of right lower extremity: Secondary | ICD-10-CM | POA: Diagnosis present

## 2021-08-05 DIAGNOSIS — I82441 Acute embolism and thrombosis of right tibial vein: Secondary | ICD-10-CM | POA: Diagnosis not present

## 2021-08-05 DIAGNOSIS — J9611 Chronic respiratory failure with hypoxia: Secondary | ICD-10-CM | POA: Diagnosis not present

## 2021-08-05 LAB — CBC
HCT: 33.9 % — ABNORMAL LOW (ref 39.0–52.0)
Hemoglobin: 11.2 g/dL — ABNORMAL LOW (ref 13.0–17.0)
MCH: 29 pg (ref 26.0–34.0)
MCHC: 33 g/dL (ref 30.0–36.0)
MCV: 87.8 fL (ref 80.0–100.0)
Platelets: 368 10*3/uL (ref 150–400)
RBC: 3.86 MIL/uL — ABNORMAL LOW (ref 4.22–5.81)
RDW: 13.7 % (ref 11.5–15.5)
WBC: 10.1 10*3/uL (ref 4.0–10.5)
nRBC: 0 % (ref 0.0–0.2)

## 2021-08-05 LAB — MRSA NEXT GEN BY PCR, NASAL: MRSA by PCR Next Gen: NOT DETECTED

## 2021-08-05 LAB — STREP PNEUMONIAE URINARY ANTIGEN: Strep Pneumo Urinary Antigen: NEGATIVE

## 2021-08-05 MED ORDER — ADULT MULTIVITAMIN W/MINERALS CH
1.0000 | ORAL_TABLET | Freq: Every day | ORAL | Status: DC
  Administered 2021-08-05 – 2021-08-07 (×3): 1 via ORAL
  Filled 2021-08-05 (×3): qty 1

## 2021-08-05 MED ORDER — HEPARIN BOLUS VIA INFUSION
700.0000 [IU] | Freq: Once | INTRAVENOUS | Status: AC
  Administered 2021-08-05: 700 [IU] via INTRAVENOUS
  Filled 2021-08-05: qty 700

## 2021-08-05 MED ORDER — CHLORHEXIDINE GLUCONATE CLOTH 2 % EX PADS
6.0000 | MEDICATED_PAD | Freq: Every day | CUTANEOUS | Status: DC
  Administered 2021-08-05 – 2021-08-06 (×2): 6 via TOPICAL

## 2021-08-05 MED ORDER — GUAIFENESIN ER 600 MG PO TB12
600.0000 mg | ORAL_TABLET | Freq: Two times a day (BID) | ORAL | Status: DC | PRN
  Administered 2021-08-06: 600 mg via ORAL
  Filled 2021-08-05 (×2): qty 1

## 2021-08-05 MED ORDER — APIXABAN 5 MG PO TABS: 5.0000 mg | ORAL_TABLET | Freq: Two times a day (BID) | ORAL | Status: DC

## 2021-08-05 MED ORDER — APIXABAN 5 MG PO TABS
10.0000 mg | ORAL_TABLET | Freq: Two times a day (BID) | ORAL | Status: DC
  Administered 2021-08-05 – 2021-08-07 (×5): 10 mg via ORAL
  Filled 2021-08-05 (×5): qty 2

## 2021-08-05 MED ORDER — ENSURE ENLIVE PO LIQD
237.0000 mL | Freq: Two times a day (BID) | ORAL | Status: DC
  Administered 2021-08-05 – 2021-08-07 (×5): 237 mL via ORAL
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
## 2021-08-05 NOTE — Plan of Care (Signed)

## 2021-08-05 NOTE — Progress Notes (Addendum)
Pt is requesting mucinex for cough. MD Damita Dunnings made aware. Will continue to monitor.  Update 0232: MD Damita Dunnings made aware. Will continue to monitor.

## 2021-08-05 NOTE — Progress Notes (Signed)
Pt has a chronic foley but no order was place. MD Damita Dunnings made aware and placed order. Will continue to monitor.

## 2021-08-05 NOTE — Consult Note (Signed)
ANTICOAGULATION CONSULT NOTE  Pharmacy Consult for Heparin infusion >> Apixaban Indication: pulmonary embolus and DVT  Patient Measurements: Height: 5\' 5"  (165.1 cm) Weight: 47.6 kg (105 lb) IBW/kg (Calculated) : 61.5 Heparin Dosing Weight: 60.1 kg  Labs: Recent Labs    08/04/21 1355 08/04/21 1514 08/04/21 1603 08/04/21 2255 08/05/21 0801  HGB 13.1  --   --   --  11.2*  HCT 39.9  --   --   --  33.9*  PLT 457*  --   --   --  368  APTT  --   --  33  --   --   LABPROT  --   --  16.1*  --   --   INR  --   --  1.3*  --   --   HEPARINUNFRC  --   --   --  0.25*  --   CREATININE 0.91  --   --   --   --   TROPONINIHS 9 8  --   --   --      Estimated Creatinine Clearance: 58.8 mL/min (by C-G formula based on SCr of 0.91 mg/dL).   Medical History: Past Medical History:  Diagnosis Date   Abdominal aortic aneurysm (AAA) ([place])    4.3 cm by CT in 07/2018; b. 01/2020 CT: 4.1x4.1cm juxtarenal AAA. Mod-large mural thrombus.   CAD (coronary artery disease)    a.  2008 Cath: nonobs dzs;  b. 03/2011 NSTEMI: 100 D1 (2.25x18 MiniVision BMS);  c. 04/2011 NSTEMI/PCI: D1 100->thrombectomy/PTCA;  d. 04/2016 Inf STEMI: RCA 33m (3.0x34 Resolute Integrity DES); e. 07/2016 PCI: LAD 60 (FFR 0.78-->2.5x23 Xience Alpine DES); 12/2019 PCI: LM nl, LAD 13m, 65m ISR/70d (2.75x26 Resolute Onyx DESp; 2.25x12 Resolute Onyx DESm), LCX 60d, RCA 25p ISR.   HFrEF (heart failure with reduced ejection fraction) ([place])    a. 04/2016 Echo: EF 30-35%, diff HK, Gr1 DD, triv AI, mild to mod MR;  b. 07/2016 LV gram: EF 35-40%; c. 11/2017 Echo: EF 40-45%, Gr2 DD; d. 10/2019 Echo: EF 30-35%, glob HK. GrII DD; e. 11/2020 Echo: EF 35-40%, GrI DD. Low-nl RV fxn. Mild MR/AI.   Hyperlipidemia    Ischemic cardiomyopathy    a. 04/2016 Echo: EF 30-35%, diff HK, Gr1 DD;  b. 07/2016 LV gram: EF 35-40%; c. 11/2017 Echo: EF 40-45%; d. 10/2019 Echo: EF 30-35%, grII DD; e. 11/2020 Echo: EF 35-40%, GrI DD.   Mediastinal adenopathy    a. 04/2017 CT  chest: stable mediastinal and hilar lymphadenopathy w/ speckled Ca2+. Suspicious for sarcoidosis.   Polymyalgia rheumatica ([place]) 02/2015   Pulmonary nodules    a. nonspecific nodules in the right middle lobe and right lower lobe by CT; b. 04/2017 CT Chest: Stable RML/RLL nodules - followed by pulm; b. 01/2020 CT Chest: 87mm RUL pulm nodule as well as mediastinal and hilar nodes.   Thoracic ascending aortic aneurysm ([place])    a. 06/2016 CTA: stable 4 cm TAA; b. 01/2020 CT Chest: 4.2cm TAA; c. 01/2021 CTA Chest: 4.1 x 4.1cm TAA.   TIA (transient ischemic attack)    a. 10/2017 possible TIA - transient R sided wkns; b. 11/2017 Carotid U/S: nl.   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 Admitted with chest pain and increased O2 requirements. Pharmacy consulted to transition heparin infusion to apixaban for acute DVT / PE.   Plan: --Stop IV heparin --Start apixaban 10 mg BID x 7 days followed by apixaban 5 mg BID for remaining duration of therapy --CBC at least every 3 days per protocol   Clinton Walsh  08/05/2021

## 2021-08-05 NOTE — Progress Notes (Addendum)
Progress Note    Clinton Walsh  RCV:[id number] DOB: January 16, 1960  DOA: 08/04/2021 PCP: Pcp, No      Brief Narrative:    Medical records reviewed and are as summarized below:  Clinton Walsh is a 61 y.o. 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  He presented to the hospital because of acute onset of severe right-sided chest pain posteriorly with shortness of breath restarted on the day of admission.  He also reported a 2-day history of right leg pain.  He has been coughing, he attributes this cough to COPD.  He also complained of difficulty swallowing, poor appetite and poor oral intake.  He was found to have acute pulmonary embolism and community-acquired pneumonia.  He was hypotensive in the ED.  He was treated with IV heparin infusion, empiric IV antibiotics and IV fluids.    Assessment/Plan:   Principal Problem:   Pulmonary embolism (HCC) Active Problems:   Community acquired pneumonia   Hypotension   Panlobular emphysema ([place])   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HCC)   Chronic respiratory failure with hypoxia, on home O2 therapy (HCC)   Acute deep vein thrombosis (DVT) of right lower extremity (HCC)   Nutrition Problem: Increased nutrient needs Etiology: chronic illness (CHF)  Signs/Symptoms: estimated needs   Body mass index is 17.47 kg/m.  (Underweight)  Acute pulmonary embolism, right lower extremity DVT:  Switch IV heparin to Eliquis.  Analgesics as needed for pain.  Community-acquired pneumonia in a patient with history of dysphagia and aspiration, history of cryptogenic organizing pneumonia on suppressive azithromycin since April 2023: Continue empiric IV antibiotics.  Appreciate recommendations from speech therapist. He was previously on prednisone but he stopped taking the prednisone because of "cramping".  Underweight, poor oral intake, dysphagia: Appreciate recommendations from speech therapist.  Barium swallow has been ordered for evaluation of dysphagia.   Hypotension: Improved.  Amlodipine and losartan on hold.  Recent diarrhea: Improved  Chronic hypoxic respiratory failure: Continue 2 L/min oxygen as needed.  CAD (previous MI x4) s/p coronary stents, aortic atherosclerosis, ascending thoracic aortic aneurysm 4 cm: Continue aspirin.  Substitute Eliquis for Plavix because of acute PE.  Patient follow-up with cardiologist for further recommendations regarding long-term antithrombotic therapy.  Severe COPD: Stable.  Continue bronchodilators  Chronic combined systolic and diastolic CHF: Stable.  2D echo in November 2022 showed EF estimated at 35 to 25%, grade 1 diastolic dysfunction   Chronic urinary retention with indwelling Foley catheter in place   Diet Order             DIET DYS 2 Room service appropriate? Yes; Fluid consistency: Thin  Diet effective now                            Consultants: None  Procedures: None    Medications:    apixaban  10 mg Oral BID   Followed by   Derrill Memo ON 08/12/2021] apixaban  5 mg Oral BID  aspirin EC  81 mg Oral Daily   atorvastatin  80 mg Oral Daily   Chlorhexidine Gluconate Cloth  6 each Topical Q0600   feeding supplement  237 mL Oral BID BM   multivitamin with minerals  1 tablet Oral Daily   pantoprazole  40 mg Oral Daily   Continuous Infusions:  sodium chloride 10 mL/hr at 08/04/21 2135   cefTRIAXone  (ROCEPHIN)  IV 2 g (08/04/21 2136)     Anti-infectives (From admission, onward)    Start     Dose/Rate Route Frequency Ordered Stop   08/04/21 2200  cefTRIAXone (ROCEPHIN) 2 g in sodium chloride 0.9 % 100 mL IVPB        2 g 200 mL/hr over 30 Minutes Intravenous Every 24 hours 08/04/21 1807 08/09/21 2159   08/04/21 1615  ceFEPIme (MAXIPIME) 2 g in sodium chloride 0.9 % 100 mL IVPB        2 g 200 mL/hr over 30 Minutes Intravenous  Once 08/04/21 1611 08/04/21 1650   08/04/21 1615  vancomycin (VANCOCIN) IVPB 1000 mg/200 mL premix        1,000 mg 200 mL/hr over 60 Minutes Intravenous  Once 08/04/21 1611 08/04/21 1803              Family Communication/Anticipated D/C date and plan/Code Status   DVT prophylaxis:  apixaban (ELIQUIS) tablet 10 mg  apixaban (ELIQUIS) tablet 5 mg     Code Status: Full Code  Family Communication: None Disposition Plan: Plan to discharge home in 2 to 3 days   Status is: Inpatient Remains inpatient appropriate because: IV antibiotics for pneumonia       Subjective:   Interval events noted.  He said he had chest pain earlier this morning but it is better.  No shortness of breath.  He had taken off his oxygen.  He said he normally uses oxygen at night, not during the day.  I reminded him that he had mentioned yesterday that he was using it around-the-clock lately but he said he did not say that  Objective:    Vitals:   08/05/21 0756 08/05/21 0800 08/05/21 0820 08/05/21 1134  BP:  107/66 109/75 100/70  Pulse: 81 81 79 75  Resp:  19  17  Temp:  98.6 F (37 C) 98.6 F (37 C) 98.4 F (36.9 C)  TempSrc:  Oral Oral Oral  SpO2: (!) 89% 94% 90% 93%  Weight:      Height:       No data found.   Intake/Output Summary (Last 24 hours) at 08/05/2021 1425 Last data filed at 08/05/2021 1403 Gross per 24 hour  Intake 48 ml  Output 1100 ml  Net -1052 ml   Filed Weights   08/04/21 1621  Weight: 47.6 kg    Exam:  GEN: NAD SKIN: No  rash EYES: EOMI ENT: MMM CV: RRR PULM: CTA B ABD: soft, ND, NT, +BS CNS: AAO x 3, non focal EXT: No edema or tenderness GU: Foley catheter draining amber urine       Data Reviewed:   I have personally reviewed following labs and imaging studies:  Labs: Labs show the following:   Basic Metabolic Panel: Recent Labs  Lab 08/04/21 1355  NA 138  K 3.8  CL 103  CO2 23  GLUCOSE 98  BUN 24*  CREATININE 0.91  CALCIUM 8.6*   GFR Estimated Creatinine Clearance: 58.8 mL/min (by C-G formula based on SCr of 0.91 mg/dL). Liver Function Tests: No  results for input(s): "AST", "ALT", "ALKPHOS", "BILITOT", "PROT", "ALBUMIN" in the last 168 hours. No results for input(s): "LIPASE", "AMYLASE" in the last 168 hours. No results for input(s): "AMMONIA" in the last 168 hours. Coagulation profile Recent Labs  Lab 08/04/21 1603  INR 1.3*    CBC: Recent Labs  Lab 08/04/21 1355 08/05/21 0801  WBC 10.7* 10.1  HGB 13.1 11.2*  HCT 39.9 33.9*  MCV 89.1 87.8  PLT 457* 368   Cardiac Enzymes: No results for input(s): "CKTOTAL", "CKMB", "CKMBINDEX", "TROPONINI" in the last 168 hours. BNP (last 3 results) No results for input(s): "PROBNP" in the last 8760 hours. CBG: No results for input(s): "GLUCAP" in the last 168 hours. D-Dimer: No results for input(s): "DDIMER" in the last 72 hours. Hgb A1c: No results for input(s): "HGBA1C" in the last 72 hours. Lipid Profile: No results for input(s): "CHOL", "HDL", "LDLCALC", "TRIG", "CHOLHDL", "LDLDIRECT" in the last 72 hours. Thyroid function studies: No results for input(s): "TSH", "T4TOTAL", "T3FREE", "THYROIDAB" in the last 72 hours.  Invalid input(s): "FREET3" Anemia work up: No results for input(s): "VITAMINB12", "FOLATE", "FERRITIN", "TIBC", "IRON", "RETICCTPCT" in the last 72 hours. Sepsis Labs: Recent Labs  Lab 08/04/21 1355 08/04/21 1514 08/05/21 0801  PROCALCITON <0.10  --   --   WBC 10.7*  --  10.1  LATICACIDVEN 2.4*  1.6  --     Microbiology Recent Results (from the past 240 hour(s))  SARS Coronavirus 2 by RT PCR (hospital order, performed in [facility] hospital lab) *cepheid single result test* Anterior Nasal Swab     Status: None   Collection Time: 08/04/21  3:14 PM   Specimen: Anterior Nasal Swab  Result Value Ref Range Status   SARS Coronavirus 2 by RT PCR NEGATIVE NEGATIVE Final    Comment: (NOTE) SARS-CoV-2 target nucleic acids are NOT DETECTED.  The SARS-CoV-2 RNA is generally detectable in upper and lower respiratory specimens during the acute phase of infection. The lowest concentration of SARS-CoV-2 viral copies this assay can detect is 250 copies / mL. A negative result does not preclude SARS-CoV-2 infection and should not be used as the sole basis for treatment or other patient management decisions.  A negative result may occur with improper specimen collection / handling, submission of specimen other than nasopharyngeal swab, presence of viral mutation(s) within the areas targeted by this assay, and inadequate number of viral copies (<250 copies / mL). A negative result must be combined with clinical observations, patient history, and epidemiological information.  Fact Sheet for Patients:   https://www.patel.info/  Fact Sheet for Healthcare Providers: https://hall.com/  This test is not yet approved or  cleared by the [place] FDA and has been authorized for detection and/or diagnosis of SARS-CoV-2 by FDA under an Emergency Use Authorization (EUA).  This EUA will remain in effect (meaning this test can be used) for the duration of the COVID-19 declaration under Section 564(b)(1) of the Act, 21 U.S.C. section 360bbb-3(b)(1), unless the authorization is terminated or revoked sooner.  Performed at [facility], [address] [place], [place] [address]   Blood culture (routine x 2)     Status: None (Preliminary result)    Collection Time: 08/04/21  6:14 PM   Specimen: BLOOD  Result Value Ref Range Status   Specimen Description BLOOD BLOOD RIGHT ARM  Final   Special Requests   Final    BOTTLES DRAWN AEROBIC AND ANAEROBIC Blood Culture adequate volume   Culture   Final  NO GROWTH < 12 HOURS Performed at [facility], [address] [place], [place] [address]    Report Status PENDING  Incomplete  Blood culture (routine x 2)     Status: None (Preliminary result)   Collection Time: 08/04/21  6:14 PM   Specimen: BLOOD  Result Value Ref Range Status   Specimen Description BLOOD Blood Culture adequate volume  Final   Special Requests   Final    BOTTLES DRAWN AEROBIC AND ANAEROBIC BLOOD RIGHT ARM   Culture   Final    NO GROWTH < 12 HOURS Performed at [facility], [address], [place], [place] [address]    Report Status PENDING  Incomplete  Expectorated Sputum Assessment w Gram Stain, Rflx to Resp Cult     Status: None   Collection Time: 08/04/21  6:45 PM   Specimen: Expectorated Sputum  Result Value Ref Range Status   Specimen Description EXPECTORATED SPUTUM  Final   Special Requests NONE  Final   Sputum evaluation   Final    THIS SPECIMEN IS ACCEPTABLE FOR SPUTUM CULTURE Performed at [facility], [address], [place], [place] [address]    Report Status 08/04/2021 FINAL  Final  Culture, Respiratory w Gram Stain     Status: None (Preliminary result)   Collection Time: 08/04/21  6:45 PM  Result Value Ref Range Status   Specimen Description   Final    EXPECTORATED SPUTUM Performed at [facility], [address], [place], [place] [address]    Special Requests   Final    NONE Reflexed from [id number] Performed at [facility], [address] [place], [place] [address]    Gram Stain   Final    FEW WBC PRESENT,BOTH PMN AND MONONUCLEAR FEW GRAM POSITIVE COCCI IN PAIRS FEW GRAM VARIABLE ROD Performed at [facility], [place] [address] [place], [place] [address]    Culture PENDING  Incomplete   Report Status PENDING  Incomplete  MRSA Next Gen by PCR, Nasal     Status: None   Collection Time: 08/04/21 10:45 PM   Specimen: Nasal Mucosa; Nasal Swab  Result Value Ref Range Status   MRSA by PCR Next Gen NOT DETECTED NOT DETECTED Final    Comment: (NOTE) The GeneXpert MRSA Assay (FDA approved for NASAL specimens only), is one component of a comprehensive MRSA colonization surveillance program. It is not intended to diagnose MRSA infection nor to guide or monitor treatment for MRSA infections. Test performance is not FDA approved in patients less than 33 years old. Performed at [facility], [address] [place], [place] [address]     Procedures and diagnostic studies:  US Venous Img Lower Bilateral (DVT)  Result Date: 08/04/2021 CLINICAL DATA:  Known pulmonary emboli EXAM: BILATERAL LOWER EXTREMITY VENOUS DOPPLER ULTRASOUND TECHNIQUE: Gray-scale sonography with graded compression, as well as color Doppler and duplex ultrasound were performed to evaluate the lower extremity deep venous systems from the level of the common femoral vein and including the common femoral, femoral, profunda femoral, popliteal and calf veins including the posterior tibial, peroneal and gastrocnemius veins when visible. The superficial great saphenous vein was also interrogated. Spectral Doppler was utilized to evaluate flow at rest and with distal augmentation maneuvers in the common femoral, femoral and popliteal veins. COMPARISON:  None Available. FINDINGS: RIGHT LOWER EXTREMITY Common Femoral Vein: No evidence of thrombus. Normal compressibility, respiratory phasicity and response to augmentation. Saphenofemoral Junction: No evidence of thrombus.  Normal compressibility and flow on color Doppler imaging. Profunda Femoral Vein: No evidence of thrombus. Normal compressibility and flow on color Doppler imaging. Femoral Vein: No evidence of  thrombus. Normal compressibility, respiratory phasicity and response to augmentation. Popliteal Vein: No evidence of thrombus. Normal compressibility, respiratory phasicity and response to augmentation. Calf Veins: Thrombus is noted within the posterior tibial and peroneal veins with decreased compressibility. Superficial Great Saphenous Vein: Thrombus is noted with decreased compressibility. Thrombus is also noted in the lesser saphenous vein. Venous Reflux:  None. Other Findings:  None. LEFT LOWER EXTREMITY Common Femoral Vein: No evidence of thrombus. Normal compressibility, respiratory phasicity and response to augmentation. Saphenofemoral Junction: No evidence of thrombus. Normal compressibility and flow on color Doppler imaging. Profunda Femoral Vein: No evidence of thrombus. Normal compressibility and flow on color Doppler imaging. Femoral Vein: No evidence of thrombus. Normal compressibility, respiratory phasicity and response to augmentation. Popliteal Vein: No evidence of thrombus. Normal compressibility, respiratory phasicity and response to augmentation. Calf Veins: No evidence of thrombus. Normal compressibility and flow on color Doppler imaging. Superficial Great Saphenous Vein: No evidence of thrombus. Normal compressibility. Venous Reflux:  None. Other Findings:  None. IMPRESSION: Thrombus within the peroneal and posterior tibial veins on the right. Superficial thrombus is noted within the greater and lesser saphenous veins. Electronically Signed   By: Inez Catalina M.D.   On: 08/04/2021 19:34   CT Angio Chest PE W and/or Wo Contrast  Result Date: 08/04/2021 CLINICAL DATA:  Chest pain. EXAM: CT ANGIOGRAPHY CHEST WITH CONTRAST TECHNIQUE: Multidetector CT imaging of the chest was performed using the standard protocol during bolus administration of intravenous contrast. Multiplanar CT image reconstructions and MIPs were obtained to evaluate the vascular anatomy. RADIATION DOSE REDUCTION: This exam was  performed according to the departmental dose-optimization program which includes automated exposure control, adjustment of the mA and/or kV according to patient size and/or use of iterative reconstruction technique. CONTRAST:  28mL OMNIPAQUE IOHEXOL 350 MG/ML SOLN COMPARISON:  May 15, 2021. FINDINGS: Cardiovascular: 2 small peripheral filling defects are noted in peripheral upper and middle lobe branches of the right pulmonary artery. Normal cardiac size. No pericardial effusion. Coronary calcifications are noted. 4.1 cm ascending thoracic aortic aneurysm. Mediastinum/Nodes: No enlarged mediastinal, hilar, or axillary lymph nodes. Thyroid gland, trachea, and esophagus demonstrate no significant findings. Lungs/Pleura: Emphysematous disease is noted bilaterally. No pneumothorax or pleural effusion is noted large right upper and lower lobe airspace opacities are noted concerning for pneumonia. Stable probable scarring is noted in left upper lobe. Upper Abdomen: No acute abnormality. Musculoskeletal: No chest wall abnormality. No acute or significant osseous findings. Review of the MIP images confirms the above findings. IMPRESSION: Two small pulmonary emboli are noted in peripheral upper and middle lobe branches of the right pulmonary artery. Critical Value/emergent results were called by telephone at the time of interpretation on 08/04/2021 at 3:42 pm to provider Hulan Saas, MD, who verbally acknowledged these results. Large right upper and lower lobe airspace opacities are noted concerning for pneumonia. 4.0 cm ascending thoracic aortic aneurysm. Recommend annual imaging followup by CTA or MRA. This recommendation follows 2010 ACCF/AHA/AATS/ACR/ASA/SCA/SCAI/SIR/STS/SVM Guidelines for the Diagnosis and Management of Patients with Thoracic Aortic Disease. Circulation. 2010; 121: N277-O242. Aortic aneurysm NOS (ICD10-I71.9). Coronary artery calcifications are noted. Aortic Atherosclerosis (ICD10-I70.0) and Emphysema  (ICD10-J43.9). Electronically Signed   By: Marijo Conception M.D.   On: 08/04/2021 15:43   DG Chest 2 View  Result Date: 08/04/2021 CLINICAL DATA:  Chest pain. EXAM: CHEST -  2 VIEW COMPARISON:  07/14/2021 FINDINGS: Interval increase in consolidative opacity in the right parahilar lung with marked architectural distortion in the right mid lung. Bandlike linear density in the left upper lobe is stable. No evidence for pneumothorax or pleural effusion. The cardiopericardial silhouette is within normal limits for size. Telemetry leads overlie the chest. IMPRESSION: Interval increase in consolidative opacity in the right parahilar lung. Findings may reflect progressive pneumonia although recent CT of 05/15/2021 was concerning for neoplasm. Repeat CT chest with contrast may prove helpful to further evaluate. Electronically Signed   By: Misty Stanley M.D.   On: 08/04/2021 14:36               LOS: 1 day   Azzie Thiem  [facility]   Pager on [email or web address]. If 7PM-7AM, please contact night-coverage at [email or web address]     08/05/2021, 2:25 PM

## 2021-08-05 NOTE — Progress Notes (Signed)
Initial Nutrition Assessment  DOCUMENTATION CODES:   Underweight  INTERVENTION:   -Ensure Enlive po BID, each supplement provides 350 kcal and 20 grams of protein -MVI with minerals daily -Magic cup BID with meals, each supplement provides 290 kcal and 9 grams of protein   NUTRITION DIAGNOSIS:   Increased nutrient needs related to chronic illness (CHF) as evidenced by estimated needs.  GOAL:   Patient will meet greater than or equal to 90% of their needs  MONITOR:   PO intake, Supplement acceptance, Diet advancement  REASON FOR ASSESSMENT:   Malnutrition Screening Tool    ASSESSMENT:   Ptwith medical history significant for cryptogenic organizing pneumonia, dysphagia, CAD (MI x4) s/p coronary stents, abdominal aortic aneurysm, chronic systolic and diastolic CHF (EF 35 to 74%) severe COPD, chronic hypoxic respiratory failure, tobacco use disorder.  He presented to the hospital because of right-sided chest pain that started earlier today around noon.  Pt admitted with pulmonary embolism and CAP.  8/5- s/p BSE- dysphagia 2 diet with thin liquids  Reviewed I/O's: -152 ml x 24 hours  UOP: 400 ml x 24 hours   Pt unavailable at time of visit. Attempted to speak with pt via call to hospital room phone, however, unable to reach. RD unable to obtain further nutrition-related history or complete nutrition-focused physical exam at this time.    No meal completion data available to assess at this time.   Reviewed wt hx; pt has experienced a 8.8% wt loss over the past 3 months, which is significant for time frame.  Due to significant wt loss and underweight status, suspect pt with malnutrition, however, unable to assess at this time. Pt would greatly benefit from addition of oral nutrition supplements.   Medications reviewed.  Labs reviewed: CBGS: 95.    Diet Order:   Diet Order             DIET DYS 2 Room service appropriate? Yes; Fluid consistency: Thin  Diet effective  now                   EDUCATION NEEDS:   No education needs have been identified at this time  Skin:  Skin Assessment: Reviewed RN Assessment  Last BM:  08/04/21  Height:   Ht Readings from Last 1 Encounters:  08/04/21 5\' 5"  (1.651 m)    Weight:   Wt Readings from Last 1 Encounters:  08/04/21 47.6 kg   BMI:  Body mass index is 17.47 kg/m.  Estimated Nutritional Needs:   Kcal:  1900-2100  Protein:  95-110 grams  Fluid:  > 1.9 L    Loistine Chance, RD, LDN, [place] Registered Dietitian II Certified Diabetes Care and Education Specialist Please refer to [facility] for RD and/or RD on-call/weekend/after hours pager

## 2021-08-05 NOTE — Consult Note (Signed)
ANTICOAGULATION CONSULT NOTE -   Pharmacy Consult for Heparin infusion Indication: pulmonary embolus  Allergies  Allergen Reactions   Sulfa Antibiotics Rash    Patient Measurements: Height: 5\' 5"  (165.1 cm) Weight: 47.6 kg (105 lb) IBW/kg (Calculated) : 61.5 Heparin Dosing Weight: 60.1 kg  Vital Signs: Temp: 99.1 F (37.3 C) (08/05 0333) Temp Source: Oral (08/05 0333) BP: 104/68 (08/05 0333) Pulse Rate: 94 (08/05 0333)  Labs: Recent Labs    08/04/21 1355 08/04/21 1514 08/04/21 1603 08/04/21 2255  HGB 13.1  --   --   --   HCT 39.9  --   --   --   PLT 457*  --   --   --   APTT  --   --  33  --   LABPROT  --   --  16.1*  --   INR  --   --  1.3*  --   HEPARINUNFRC  --   --   --  0.25*  CREATININE 0.91  --   --   --   TROPONINIHS 9 8  --   --      Estimated Creatinine Clearance: 58.8 mL/min (by C-G formula based on SCr of 0.91 mg/dL).   Medical History: Past Medical History:  Diagnosis Date   Abdominal aortic aneurysm (AAA) ([place])    4.3 cm by CT in 07/2018; b. 01/2020 CT: 4.1x4.1cm juxtarenal AAA. Mod-large mural thrombus.   CAD (coronary artery disease)    a.  2008 Cath: nonobs dzs;  b. 03/2011 NSTEMI: 100 D1 (2.25x18 MiniVision BMS);  c. 04/2011 NSTEMI/PCI: D1 100->thrombectomy/PTCA;  d. 04/2016 Inf STEMI: RCA 66m (3.0x34 Resolute Integrity DES); e. 07/2016 PCI: LAD 60 (FFR 0.78-->2.5x23 Xience Alpine DES); 12/2019 PCI: LM nl, LAD 73m, 20m ISR/70d (2.75x26 Resolute Onyx DESp; 2.25x12 Resolute Onyx DESm), LCX 60d, RCA 25p ISR.   HFrEF (heart failure with reduced ejection fraction) ([place])    a. 04/2016 Echo: EF 30-35%, diff HK, Gr1 DD, triv AI, mild to mod MR;  b. 07/2016 LV gram: EF 35-40%; c. 11/2017 Echo: EF 40-45%, Gr2 DD; d. 10/2019 Echo: EF 30-35%, glob HK. GrII DD; e. 11/2020 Echo: EF 35-40%, GrI DD. Low-nl RV fxn. Mild MR/AI.   Hyperlipidemia    Ischemic cardiomyopathy    a. 04/2016 Echo: EF 30-35%, diff HK, Gr1 DD;  b. 07/2016 LV gram: EF 35-40%; c. 11/2017 Echo: EF  40-45%; d. 10/2019 Echo: EF 30-35%, grII DD; e. 11/2020 Echo: EF 35-40%, GrI DD.   Mediastinal adenopathy    a. 04/2017 CT chest: stable mediastinal and hilar lymphadenopathy w/ speckled Ca2+. Suspicious for sarcoidosis.   Polymyalgia rheumatica ([place]) 02/2015   Pulmonary nodules    a. nonspecific nodules in the right middle lobe and right lower lobe by CT; b. 04/2017 CT Chest: Stable RML/RLL nodules - followed by pulm; b. 01/2020 CT Chest: 18mm RUL pulm nodule as well as mediastinal and hilar nodes.   Thoracic ascending aortic aneurysm ([place])    a. 06/2016 CTA: stable 4 cm TAA; b. 01/2020 CT Chest: 4.2cm TAA; c. 01/2021 CTA Chest: 4.1 x 4.1cm TAA.   TIA (transient ischemic attack)    a. 10/2017 possible TIA - transient R sided wkns; b. 11/2017 Carotid U/S: nl.   Tobacco abuse     Medications:  No DOACS or AC prior to admission  Assessment:  cryptogenic organizing pneumonia on azithromycin 3 times a week, dysphagia, CAD (MI x4) s/p coronary stents, abdominal aortic aneurysm, chronic systolic and diastolic  CHF (EF 35 to 40%) severe COPD, chronic hypoxic respiratory failure prescribed 2 L/min oxygen at night. Admitted with chest pain and increased O2 requirements. Pharmacy consulted to manage heparin infusion for new onset PE.  8/4 2255 HL= 0.25 Subtherapeutic  increase rate from 1000 units/hr to 1100 untis  Goal of Therapy:  Heparin level 0.3-0.7 units/ml Monitor platelets by anticoagulation protocol: Yes   Plan: 8/4 2255 HL= 0.25 Subtherapeutic   Will order bolus 700 units x 1 and increase rate from 1000 units/hr to 1100 units Check heparin level 6 hrs from rate change CBC daily   Chinita Greenland PharmD Clinical Pharmacist 08/05/2021

## 2021-08-06 DIAGNOSIS — I82441 Acute embolism and thrombosis of right tibial vein: Secondary | ICD-10-CM | POA: Diagnosis not present

## 2021-08-06 DIAGNOSIS — J189 Pneumonia, unspecified organism: Secondary | ICD-10-CM | POA: Diagnosis not present

## 2021-08-06 DIAGNOSIS — J9611 Chronic respiratory failure with hypoxia: Secondary | ICD-10-CM | POA: Diagnosis not present

## 2021-08-06 DIAGNOSIS — I2699 Other pulmonary embolism without acute cor pulmonale: Secondary | ICD-10-CM | POA: Diagnosis not present

## 2021-08-06 DIAGNOSIS — R1310 Dysphagia, unspecified: Secondary | ICD-10-CM

## 2021-08-06 LAB — CBC
HCT: 35.4 % — ABNORMAL LOW (ref 39.0–52.0)
Hemoglobin: 11.7 g/dL — ABNORMAL LOW (ref 13.0–17.0)
MCH: 29 pg (ref 26.0–34.0)
MCHC: 33.1 g/dL (ref 30.0–36.0)
MCV: 87.8 fL (ref 80.0–100.0)
Platelets: 397 10*3/uL (ref 150–400)
RBC: 4.03 MIL/uL — ABNORMAL LOW (ref 4.22–5.81)
RDW: 13.4 % (ref 11.5–15.5)
WBC: 8.2 10*3/uL (ref 4.0–10.5)
nRBC: 0 % (ref 0.0–0.2)

## 2021-08-06 MED ORDER — OXYCODONE HCL 5 MG PO TABS
5.0000 mg | ORAL_TABLET | Freq: Four times a day (QID) | ORAL | Status: DC | PRN
  Administered 2021-08-06 – 2021-08-07 (×4): 5 mg via ORAL
  Filled 2021-08-06 (×4): qty 1

## 2021-08-06 NOTE — Progress Notes (Addendum)
Progress Note    Clinton Walsh  DXA:[id number] DOB: September 15, 1960  DOA: 08/04/2021 PCP: Pcp, No      Brief Narrative:    Medical records reviewed and are as summarized below:  Clinton Walsh is a 61 y.o. male with medical history significant for cryptogenic organizing pneumonia on azithromycin 3 times a week (discontinued prednisone himself), dysphagia, chronic urinary retention with indwelling Foley catheter for about 2 years, CAD (MI x4) s/p coronary stents, abdominal aortic aneurysm, chronic systolic and diastolic CHF (EF 35 to 20%) severe COPD, chronic hypoxic respiratory failure prescribed 2 L/min oxygen at night but has been using 5 L/min oxygen around-the-clock (based on how he she was admitted by oxygen saturation) for about 2 weeks prior to admission, tobacco use disorder.  He presented to the hospital because of acute onset of severe right-sided chest pain posteriorly with shortness of breath restarted on the day of admission.  He also reported a 2-day history of right leg pain.  He has been coughing, he attributes this cough to COPD.  He also complained of difficulty swallowing, poor appetite and poor oral intake.  He was found to have acute pulmonary embolism and community-acquired pneumonia.  He was hypotensive in the ED.  He was treated with IV heparin infusion, empiric IV antibiotics and IV fluids.    Assessment/Plan:   Principal Problem:   Pulmonary embolism (HCC) Active Problems:   Community acquired pneumonia   Hypotension   Panlobular emphysema ([place])   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HCC)   Chronic respiratory failure with hypoxia, on home O2 therapy (HCC)   Acute deep vein thrombosis (DVT) of right lower extremity (HCC)   Dysphagia   Nutrition Problem: Increased nutrient needs Etiology: chronic illness (CHF)  Signs/Symptoms: estimated needs   Body mass index is 16.64 kg/m.  (Underweight)  Acute pulmonary embolism, right lower  extremity DVT: Continue Eliquis.  Analgesics as needed for pain.  Community-acquired pneumonia in a patient with history of dysphagia and aspiration, history of cryptogenic organizing pneumonia on suppressive azithromycin since April 2023: Continue IV ceftriaxone.  No growth on blood cultures thus far.  MRSA screen was negative.  Appreciate recommendations from speech therapist. He was previously on prednisone but he stopped taking the prednisone because of "cramping".  Underweight, poor oral intake, dysphagia: Appreciate recommendations from speech therapist.  Barium swallow has been ordered for evaluation of dysphagia.   Hypotension: Improved.  Amlodipine and losartan on hold.  Recent diarrhea: Improved  Chronic hypoxic respiratory failure: Continue 2 L/min oxygen as needed.  CAD (previous MI x4) s/p coronary stents, aortic atherosclerosis, ascending thoracic aortic aneurysm 4 cm: Continue aspirin.  Substitute Eliquis for Plavix because of acute PE.  Patient follow-up with cardiologist for further recommendations regarding long-term antithrombotic therapy.  Severe COPD: Stable.  Continue bronchodilators  Chronic combined systolic and diastolic CHF: Stable.  2D echo in November 2022 showed EF estimated at 35 to 94%, grade 1 diastolic dysfunction   Chronic urinary retention with indwelling Foley catheter in place   Diet Order             DIET DYS 2 Room service appropriate? Yes; Fluid consistency: Thin  Diet effective now                            Consultants: None  Procedures: None    Medications:    apixaban  10 mg Oral BID   Followed by   [  START ON 08/12/2021] apixaban  5 mg Oral BID   aspirin EC  81 mg Oral Daily   atorvastatin  80 mg Oral Daily   Chlorhexidine Gluconate Cloth  6 each Topical Q0600   feeding supplement  237 mL Oral BID BM   multivitamin with minerals  1 tablet Oral Daily   pantoprazole  40 mg Oral Daily   Continuous Infusions:   sodium chloride 10 mL/hr at 08/04/21 2135   cefTRIAXone (ROCEPHIN)  IV Stopped (08/05/21 2134)     Anti-infectives (From admission, onward)    Start     Dose/Rate Route Frequency Ordered Stop   08/04/21 2200  cefTRIAXone (ROCEPHIN) 2 g in sodium chloride 0.9 % 100 mL IVPB        2 g 200 mL/hr over 30 Minutes Intravenous Every 24 hours 08/04/21 1807 08/09/21 2159   08/04/21 1615  ceFEPIme (MAXIPIME) 2 g in sodium chloride 0.9 % 100 mL IVPB        2 g 200 mL/hr over 30 Minutes Intravenous  Once 08/04/21 1611 08/04/21 1650   08/04/21 1615  vancomycin (VANCOCIN) IVPB 1000 mg/200 mL premix        1,000 mg 200 mL/hr over 60 Minutes Intravenous  Once 08/04/21 1611 08/04/21 1803              Family Communication/Anticipated D/C date and plan/Code Status   DVT prophylaxis:  apixaban (ELIQUIS) tablet 10 mg  apixaban (ELIQUIS) tablet 5 mg     Code Status: Full Code  Family Communication: None Disposition Plan: Plan to discharge home in 2 to 3 days   Status is: Inpatient Remains inpatient appropriate because: IV antibiotics for pneumonia       Subjective:   He complains of right-sided chest pain that radiates across right lateral chest wall to the right back.  No shortness of breath.  He is tolerating room air  Objective:    Vitals:   08/06/21 0026 08/06/21 0404 08/06/21 0744 08/06/21 1223  BP: 107/81 116/82 108/78 125/82  Pulse: 77 74 74 91  Resp: 17 17 18 16   Temp: 98.3 F (36.8 C) 98.2 F (36.8 C) 98.2 F (36.8 C) 97.8 F (36.6 C)  TempSrc: Oral Oral Oral Oral  SpO2: 96% 99% 98% 94%  Weight:  45.4 kg    Height:       No data found.   Intake/Output Summary (Last 24 hours) at 08/06/2021 1532 Last data filed at 08/06/2021 1220 Gross per 24 hour  Intake 108.89 ml  Output 1400 ml  Net -1291.11 ml   Filed Weights   08/04/21 1621 08/06/21 0404  Weight: 47.6 kg 45.4 kg    Exam:  GEN: NAD SKIN: Warm and dry EYES: EOMI ENT: MMM CV: RRR PULM: CTA  B ABD: soft, ND, NT, +BS CNS: AAO x 3, non focal EXT: No edema or tenderness GU; Foley catheter draining amber urine      Data Reviewed:   I have personally reviewed following labs and imaging studies:  Labs: Labs show the following:   Basic Metabolic Panel: Recent Labs  Lab 08/04/21 1355  NA 138  K 3.8  CL 103  CO2 23  GLUCOSE 98  BUN 24*  CREATININE 0.91  CALCIUM 8.6*   GFR Estimated Creatinine Clearance: 56.1 mL/min (by C-G formula based on SCr of 0.91 mg/dL). Liver Function Tests: No results for input(s): "AST", "ALT", "ALKPHOS", "BILITOT", "PROT", "ALBUMIN" in the last 168 hours. No results for input(s): "LIPASE", "AMYLASE" in  the last 168 hours. No results for input(s): "AMMONIA" in the last 168 hours. Coagulation profile Recent Labs  Lab 08/04/21 1603  INR 1.3*    CBC: Recent Labs  Lab 08/04/21 1355 08/05/21 0801 08/06/21 0541  WBC 10.7* 10.1 8.2  HGB 13.1 11.2* 11.7*  HCT 39.9 33.9* 35.4*  MCV 89.1 87.8 87.8  PLT 457* 368 397   Cardiac Enzymes: No results for input(s): "CKTOTAL", "CKMB", "CKMBINDEX", "TROPONINI" in the last 168 hours. BNP (last 3 results) No results for input(s): "PROBNP" in the last 8760 hours. CBG: No results for input(s): "GLUCAP" in the last 168 hours. D-Dimer: No results for input(s): "DDIMER" in the last 72 hours. Hgb A1c: No results for input(s): "HGBA1C" in the last 72 hours. Lipid Profile: No results for input(s): "CHOL", "HDL", "LDLCALC", "TRIG", "CHOLHDL", "LDLDIRECT" in the last 72 hours. Thyroid function studies: No results for input(s): "TSH", "T4TOTAL", "T3FREE", "THYROIDAB" in the last 72 hours.  Invalid input(s): "FREET3" Anemia work up: No results for input(s): "VITAMINB12", "FOLATE", "FERRITIN", "TIBC", "IRON", "RETICCTPCT" in the last 72 hours. Sepsis Labs: Recent Labs  Lab 08/04/21 1355 08/04/21 1514 08/05/21 0801 08/06/21 0541  PROCALCITON <0.10  --   --   --   WBC 10.7*  --  10.1 8.2   LATICACIDVEN 2.4* 1.6  --   --     Microbiology Recent Results (from the past 240 hour(s))  SARS Coronavirus 2 by RT PCR (hospital order, performed in [facility] hospital lab) *cepheid single result test* Anterior Nasal Swab     Status: None   Collection Time: 08/04/21  3:14 PM   Specimen: Anterior Nasal Swab  Result Value Ref Range Status   SARS Coronavirus 2 by RT PCR NEGATIVE NEGATIVE Final    Comment: (NOTE) SARS-CoV-2 target nucleic acids are NOT DETECTED.  The SARS-CoV-2 RNA is generally detectable in upper and lower respiratory specimens during the acute phase of infection. The lowest concentration of SARS-CoV-2 viral copies this assay can detect is 250 copies / mL. A negative result does not preclude SARS-CoV-2 infection and should not be used as the sole basis for treatment or other patient management decisions.  A negative result may occur with improper specimen collection / handling, submission of specimen other than nasopharyngeal swab, presence of viral mutation(s) within the areas targeted by this assay, and inadequate number of viral copies (<250 copies / mL). A negative result must be combined with clinical observations, patient history, and epidemiological information.  Fact Sheet for Patients:   https://www.patel.info/  Fact Sheet for Healthcare Providers: https://hall.com/  This test is not yet approved or  cleared by the [place] FDA and has been authorized for detection and/or diagnosis of SARS-CoV-2 by FDA under an Emergency Use Authorization (EUA).  This EUA will remain in effect (meaning this test can be used) for the duration of the COVID-19 declaration under Section 564(b)(1) of the Act, 21 U.S.C. section 360bbb-3(b)(1), unless the authorization is terminated or revoked sooner.  Performed at [facility], [address] [place], [place] [address]   Blood culture (routine x 2)     Status:  None (Preliminary result)   Collection Time: 08/04/21  6:14 PM   Specimen: BLOOD  Result Value Ref Range Status   Specimen Description BLOOD BLOOD RIGHT ARM  Final   Special Requests   Final    BOTTLES DRAWN AEROBIC AND ANAEROBIC Blood Culture adequate volume   Culture   Final    NO GROWTH 2 DAYS Performed  at [facility], [address] [place], [place] [address]    Report Status PENDING  Incomplete  Blood culture (routine x 2)     Status: None (Preliminary result)   Collection Time: 08/04/21  6:14 PM   Specimen: BLOOD  Result Value Ref Range Status   Specimen Description BLOOD Blood Culture adequate volume  Final   Special Requests   Final    BOTTLES DRAWN AEROBIC AND ANAEROBIC BLOOD RIGHT ARM   Culture   Final    NO GROWTH 2 DAYS Performed at [facility], [address] [place], [place] [address]    Report Status PENDING  Incomplete  Expectorated Sputum Assessment w Gram Stain, Rflx to Resp Cult     Status: None   Collection Time: 08/04/21  6:45 PM   Specimen: Expectorated Sputum  Result Value Ref Range Status   Specimen Description EXPECTORATED SPUTUM  Final   Special Requests NONE  Final   Sputum evaluation   Final    THIS SPECIMEN IS ACCEPTABLE FOR SPUTUM CULTURE Performed at [facility], [address] [place], [place] [address]    Report Status 08/04/2021 FINAL  Final  Culture, Respiratory w Gram Stain     Status: None (Preliminary result)   Collection Time: 08/04/21  6:45 PM  Result Value Ref Range Status   Specimen Description   Final    EXPECTORATED SPUTUM Performed at [facility], [address] [place], [place] [address]    Special Requests   Final    NONE Reflexed from [id number] Performed at [facility], [address] [place], [place] [address]    Gram Stain   Final    FEW WBC PRESENT,BOTH PMN AND MONONUCLEAR FEW GRAM POSITIVE COCCI IN PAIRS FEW GRAM VARIABLE ROD    Culture   Final    CULTURE  REINCUBATED FOR BETTER GROWTH Performed at [facility], [place] [address], [place], [place] [address]    Report Status PENDING  Incomplete  MRSA Next Gen by PCR, Nasal     Status: None   Collection Time: 08/04/21 10:45 PM   Specimen: Nasal Mucosa; Nasal Swab  Result Value Ref Range Status   MRSA by PCR Next Gen NOT DETECTED NOT DETECTED Final    Comment: (NOTE) The GeneXpert MRSA Assay (FDA approved for NASAL specimens only), is one component of a comprehensive MRSA colonization surveillance program. It is not intended to diagnose MRSA infection nor to guide or monitor treatment for MRSA infections. Test performance is not FDA approved in patients less than 59 years old. Performed at [facility], [address] [place], [place] [address]     Procedures and diagnostic studies:  US Venous Img Lower Bilateral (DVT)  Result Date: 08/04/2021 CLINICAL DATA:  Known pulmonary emboli EXAM: BILATERAL LOWER EXTREMITY VENOUS DOPPLER ULTRASOUND TECHNIQUE: Gray-scale sonography with graded compression, as well as color Doppler and duplex ultrasound were performed to evaluate the lower extremity deep venous systems from the level of the common femoral vein and including the common femoral, femoral, profunda femoral, popliteal and calf veins including the posterior tibial, peroneal and gastrocnemius veins when visible. The superficial great saphenous vein was also interrogated. Spectral Doppler was utilized to evaluate flow at rest and with distal augmentation maneuvers in the common femoral, femoral and popliteal veins. COMPARISON:  None Available. FINDINGS: RIGHT LOWER EXTREMITY Common Femoral Vein: No evidence of thrombus. Normal compressibility, respiratory phasicity and response to augmentation. Saphenofemoral Junction: No evidence  of thrombus. Normal compressibility and flow on color Doppler imaging. Profunda Femoral Vein: No evidence of thrombus. Normal compressibility and flow on color  Doppler imaging. Femoral Vein: No evidence of thrombus. Normal compressibility, respiratory phasicity and response to augmentation. Popliteal Vein: No evidence of thrombus. Normal compressibility, respiratory phasicity and response to augmentation. Calf Veins: Thrombus is noted within the posterior tibial and peroneal veins with decreased compressibility. Superficial Great Saphenous Vein: Thrombus is noted with decreased compressibility. Thrombus is also noted in the lesser saphenous vein. Venous Reflux:  None. Other Findings:  None. LEFT LOWER EXTREMITY Common Femoral Vein: No evidence of thrombus. Normal compressibility, respiratory phasicity and response to augmentation. Saphenofemoral Junction: No evidence of thrombus. Normal compressibility and flow on color Doppler imaging. Profunda Femoral Vein: No evidence of thrombus. Normal compressibility and flow on color Doppler imaging. Femoral Vein: No evidence of thrombus. Normal compressibility, respiratory phasicity and response to augmentation. Popliteal Vein: No evidence of thrombus. Normal compressibility, respiratory phasicity and response to augmentation. Calf Veins: No evidence of thrombus. Normal compressibility and flow on color Doppler imaging. Superficial Great Saphenous Vein: No evidence of thrombus. Normal compressibility. Venous Reflux:  None. Other Findings:  None. IMPRESSION: Thrombus within the peroneal and posterior tibial veins on the right. Superficial thrombus is noted within the greater and lesser saphenous veins. Electronically Signed   By: Inez Catalina M.D.   On: 08/04/2021 19:34               LOS: 2 days   Montgomery Rothlisberger  [facility]   Pager on [email or web address]. If 7PM-7AM, please contact night-coverage at [email or web address]     08/06/2021, 3:32 PM

## 2021-08-06 NOTE — Progress Notes (Signed)
Clinton Walsh expressed that he is unhappy with his dysphagia diet. He disagrees with the recommendations by the SLP.  I reached out to Dr. Mal Misty per the patient's request to advance diet. Dr. Mal Misty declined at this time.   Patient stated he is going to have family bring in regular texture food for him. I advised him that, it would be at his own risk to eat more solid food.

## 2021-08-06 NOTE — Progress Notes (Signed)
SLP Cancellation Note  Patient Details Name: Clinton Walsh MRN: [id number] DOB: 02-Dec-1960   Cancelled treatment:       Reason Eval/Treat Not Completed:  Per chart review, pt with pending DG esophagus scheduled for 8/7. Will continue to follow pending results of esophageal workup.   Per chart review, temp and WBC WNL. No recent chest imaging.   In the interim, pt OK to continue diet recommendations and safe swallowing strategies/aspiration precautions from initial SLP evaluation. Should pt exhibit any increased difficulty chewing/swallowing or changes to lung status concerning for aspiration/aspiration PNA, please adjust diet as clinically indicated and notify SLP.   Cherrie Gauze, M.S., [facility] [phone] ([place])  [name] 08/06/2021, 1:14 PM

## 2021-08-06 NOTE — Progress Notes (Signed)
Clinton Walsh's family brought in a salad for the patient. He consumed with no obvious signs of aspiration.

## 2021-08-07 ENCOUNTER — Inpatient Hospital Stay: Payer: Managed Care, Other (non HMO)

## 2021-08-07 ENCOUNTER — Telehealth (HOSPITAL_COMMUNITY): Payer: Self-pay

## 2021-08-07 ENCOUNTER — Other Ambulatory Visit (HOSPITAL_COMMUNITY): Payer: Self-pay

## 2021-08-07 DIAGNOSIS — I2699 Other pulmonary embolism without acute cor pulmonale: Secondary | ICD-10-CM | POA: Diagnosis not present

## 2021-08-07 DIAGNOSIS — R1319 Other dysphagia: Secondary | ICD-10-CM

## 2021-08-07 DIAGNOSIS — J9611 Chronic respiratory failure with hypoxia: Secondary | ICD-10-CM | POA: Diagnosis not present

## 2021-08-07 DIAGNOSIS — I82441 Acute embolism and thrombosis of right tibial vein: Secondary | ICD-10-CM | POA: Diagnosis not present

## 2021-08-07 DIAGNOSIS — J189 Pneumonia, unspecified organism: Secondary | ICD-10-CM | POA: Diagnosis not present

## 2021-08-07 LAB — CULTURE, RESPIRATORY W GRAM STAIN: Culture: NORMAL

## 2021-08-07 LAB — LEGIONELLA PNEUMOPHILA SEROGP 1 UR AG: L. pneumophila Serogp 1 Ur Ag: NEGATIVE

## 2021-08-07 MED ORDER — AMOXICILLIN-POT CLAVULANATE 875-125 MG PO TABS
1.0000 | ORAL_TABLET | Freq: Two times a day (BID) | ORAL | 0 refills | Status: AC
Start: 2021-08-07 — End: 2021-08-11

## 2021-08-07 MED ORDER — OXYCODONE HCL 5 MG PO TABS: 5.0000 mg | ORAL_TABLET | Freq: Three times a day (TID) | ORAL | 0 refills | Status: DC | PRN

## 2021-08-07 MED ORDER — APIXABAN 5 MG PO TABS: 10.0000 mg | ORAL_TABLET | Freq: Two times a day (BID) | ORAL | 0 refills | Status: DC

## 2021-08-07 MED ORDER — ACETAMINOPHEN 325 MG PO TABS: 650.0000 mg | ORAL_TABLET | Freq: Four times a day (QID) | ORAL | Status: DC | PRN

## 2021-08-07 MED ORDER — APIXABAN 5 MG PO TABS: 5.0000 mg | ORAL_TABLET | Freq: Two times a day (BID) | ORAL | 0 refills | Status: DC

## 2021-08-07 NOTE — Telephone Encounter (Signed)
According to patient's chart, Dr. Ilona Sorrel ordered barium swallow on 08/05/2021 to be completed inpatient. Will hold message in triage to ensure f/u.

## 2021-08-07 NOTE — Progress Notes (Signed)
SLP Cancellation Note  Patient Details Name: Clinton Walsh MRN: [id number] DOB: [birth year]   Cancelled treatment:       Reason Eval/Treat Not Completed: Other (comment) Per chart review, DG esophagus completed today. Mild esophageal motility noted, and no concern for pharyngeal dysphagia mentioned. SLP to sign off as current diet seems appropriate at this time.  Cherrie Gauze, M.S., [facility] [phone] [name])   [name] 08/07/2021, 11:21 AM

## 2021-08-07 NOTE — Telephone Encounter (Signed)
Appt has been CXL

## 2021-08-07 NOTE — Discharge Summary (Signed)
Physician Discharge Summary   Patient: Clinton Walsh MRN: [id number] DOB: 1960/08/20  Admit date:     08/04/2021  Discharge date: 08/07/21  Discharge Physician: Jennye Boroughs   PCP: Pcp, No   Recommendations at discharge:   Follow up with PCP with 1 week  Discharge Diagnoses: Principal Problem:   Pulmonary embolism ([place]) Active Problems:   Community acquired pneumonia   Hypotension   Panlobular emphysema ([place])   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[facility])   Chronic respiratory failure with hypoxia, on home O2 therapy ([place])   Acute deep vein thrombosis (DVT) of right lower extremity ([place])   Dysphagia  Resolved Problems:   * No resolved hospital problems. *  Hospital Course:  Clinton Walsh is a 61 y.o. 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  He presented to the hospital because of acute onset of severe right-sided chest pain posteriorly with shortness of breath restarted on the day of admission.  He also reported a 2-day history of right leg pain.  He has been coughing, he attributes this cough to COPD.  He also complained of difficulty swallowing, poor appetite and poor oral intake.   He was found to have acute pulmonary embolism and community-acquired pneumonia.  He was hypotensive in the ED. He was treated with IV heparin infusion, empiric IV antibiotics and IV fluids.  He was started on Eliquis for acute PE and DVT.  Eliquis has  been substituted for Plavix.  However, given his significant history of CAD with coronary stent, he has been strongly advised to follow-up with his cardiologist,  PCP or pulmonologist for further recommendations regarding long-term antithrombotic therapy.  He has dysphagia and barium swallow revealed patulous distal esophagus with mild dysmotility.  Speech therapist recommended dysphagia 2 diet.  His condition has improved and he is deemed stable for discharge to home.        Pain control - [employer] Controlled Substance Reporting System database was reviewed. and patient was instructed, not to drive, operate heavy machinery, perform activities at heights, swimming or participation in water activities or provide baby-sitting services while on Pain, Sleep and Anxiety Medications; until their outpatient Physician has advised to do so again. Also recommended to not to take more than prescribed Pain, Sleep and Anxiety Medications.  Consultants: None Procedures performed: None Disposition: Home Diet recommendation:  Discharge Diet Orders (From admission, onward)     Start     Ordered   08/07/21 0000  DIET DYS 2       Question:  Fluid consistency:  Answer:  Thin   08/07/21 1315           Dysphagia type 2 thin Liquid DISCHARGE MEDICATION: Allergies as of 08/07/2021       Reactions   Sulfa Antibiotics Rash        Medication List     STOP taking these medications    atorvastatin 80 MG tablet Commonly known as: LIPITOR   cefdinir 300 MG capsule Commonly known as: OMNICEF   clopidogrel 75  MG tablet Commonly known as: PLAVIX   predniSONE 10 MG tablet Commonly known as: DELTASONE       TAKE these medications    acetaminophen 325 MG tablet Commonly known as: TYLENOL Take 2 tablets (650 mg total) by mouth every 6 (six) hours as needed for mild pain or fever.   amLODipine 2.5 MG tablet Commonly known as: NORVASC TAKE 1 TABLET BY MOUTH EVERY DAY    amoxicillin-clavulanate 875-125 MG tablet Commonly known as: AUGMENTIN Take 1 tablet by mouth 2 (two) times daily for 4 days.   apixaban 5 MG Tabs tablet Commonly known as: ELIQUIS Take 2 tablets (10 mg total) by mouth 2 (two) times daily for 5 days.   apixaban 5 MG Tabs tablet Commonly known as: ELIQUIS Take 1 tablet (5 mg total) by mouth 2 (two) times daily. Start taking on: August 12, 2021   aspirin EC 81 MG tablet Take 1 tablet (81 mg total) by mouth daily. Swallow whole.   azithromycin 500 MG tablet Commonly known as: Zithromax Take 1 tablet Monday Wednesday and Fridays   ipratropium-albuterol 0.5-2.5 (3) MG/3ML Soln Commonly known as: DUONEB QID AND PRN   losartan 25 MG tablet Commonly known as: COZAAR TAKE 1 TABLET BY MOUTH EVERY DAY   nitroGLYCERIN 0.4 MG SL tablet Commonly known as: NITROSTAT Place 1 tablet (0.4 mg total) under the tongue every 5 (five) minutes as needed for chest pain.   oxyCODONE 5 MG immediate release tablet Commonly known as: Oxy IR/ROXICODONE Take 1 tablet (5 mg total) by mouth every 8 (eight) hours as needed for moderate pain.   OXYGEN Inhale 2 L into the lungs at bedtime.   pantoprazole 40 MG tablet Commonly known as: Protonix Take 1 tablet (40 mg total) by mouth daily.        Follow-up Information     End, Harrell Gave, MD. Schedule an appointment as soon as possible for a visit in 1 week(s).   Specialty: Cardiology Why: Follow up with your cardiologist in 1 week to discuss long term anticoagulation/ blood thinners. Contact information: [place] [address] [phone]                Discharge Exam: Danley Danker Weights   08/04/21 1621 08/06/21 0404 08/07/21 0600  Weight: 47.6 kg 45.4 kg 47.5 kg   GEN: NAD SKIN: No ras EYES: EOMI ENT: MMM CV: RRR PULM: CTA B ABD: soft, ND, NT, +BS CNS: AAO x 3, non focal EXT: No edema or tenderness   Condition at discharge: good  The results of  significant diagnostics from this hospitalization (including imaging, microbiology, ancillary and laboratory) are listed below for reference.   Imaging Studies: DG ESOPHAGUS W SINGLE CM (SOL OR THIN BA)  Result Date: 08/07/2021 CLINICAL DATA:  Patient currently admitted for pneumonia with history of malnutrition and poor oral intake, concern for dysphagia. Patient denies coughing with oral intake, dysphagia, reflux, abdominal pain, chest pain, nausea, vomiting or regurgitation. EXAM: ESOPHAGUS/BARIUM SWALLOW/TABLET STUDY TECHNIQUE: Single contrast examination was performed using thin liquid barium. This exam was performed by Candiss Norse, PA-C, and was supervised and interpreted by Kathreen Devoid, MD. FLUOROSCOPY: Radiation Exposure Index (as provided by the fluoroscopic device): 22.40 mGy Kerma COMPARISON:  CTA chest 08/04/21, CT chest w/o 05/17/21 FINDINGS: Exam limited due to patient weakness and inability to remain in position. Swallowing: Appears normal. No vestibular penetration or aspiration seen. Pharynx: Unremarkable.  Prominent aortic notch. Esophagus: Patulous distal esophagus Esophageal motility: Mild  dysmotility, exam limited due to patient weakness and inability to position Hiatal Hernia: None. Gastroesophageal reflux: None visualized. Ingested 13 mm barium tablet: Passed normally after brief pause at the gastroesophageal junction Other: None. IMPRESSION: Single contrast esophagram showing patulous distal esophagus with mild dysmotility. No stricture, mass or hiatal hernia seen. Electronically Signed   By: Kathreen Devoid M.D.   On: 08/07/2021 10:58   US Venous Img Lower Bilateral (DVT)  Result Date: 08/04/2021 CLINICAL DATA:  Known pulmonary emboli EXAM: BILATERAL LOWER EXTREMITY VENOUS DOPPLER ULTRASOUND TECHNIQUE: Gray-scale sonography with graded compression, as well as color Doppler and duplex ultrasound were performed to evaluate the lower extremity deep venous systems from the level of the  common femoral vein and including the common femoral, femoral, profunda femoral, popliteal and calf veins including the posterior tibial, peroneal and gastrocnemius veins when visible. The superficial great saphenous vein was also interrogated. Spectral Doppler was utilized to evaluate flow at rest and with distal augmentation maneuvers in the common femoral, femoral and popliteal veins. COMPARISON:  None Available. FINDINGS: RIGHT LOWER EXTREMITY Common Femoral Vein: No evidence of thrombus. Normal compressibility, respiratory phasicity and response to augmentation. Saphenofemoral Junction: No evidence of thrombus. Normal compressibility and flow on color Doppler imaging. Profunda Femoral Vein: No evidence of thrombus. Normal compressibility and flow on color Doppler imaging. Femoral Vein: No evidence of thrombus. Normal compressibility, respiratory phasicity and response to augmentation. Popliteal Vein: No evidence of thrombus. Normal compressibility, respiratory phasicity and response to augmentation. Calf Veins: Thrombus is noted within the posterior tibial and peroneal veins with decreased compressibility. Superficial Great Saphenous Vein: Thrombus is noted with decreased compressibility. Thrombus is also noted in the lesser saphenous vein. Venous Reflux:  None. Other Findings:  None. LEFT LOWER EXTREMITY Common Femoral Vein: No evidence of thrombus. Normal compressibility, respiratory phasicity and response to augmentation. Saphenofemoral Junction: No evidence of thrombus. Normal compressibility and flow on color Doppler imaging. Profunda Femoral Vein: No evidence of thrombus. Normal compressibility and flow on color Doppler imaging. Femoral Vein: No evidence of thrombus. Normal compressibility, respiratory phasicity and response to augmentation. Popliteal Vein: No evidence of thrombus. Normal compressibility, respiratory phasicity and response to augmentation. Calf Veins: No evidence of thrombus. Normal  compressibility and flow on color Doppler imaging. Superficial Great Saphenous Vein: No evidence of thrombus. Normal compressibility. Venous Reflux:  None. Other Findings:  None. IMPRESSION: Thrombus within the peroneal and posterior tibial veins on the right. Superficial thrombus is noted within the greater and lesser saphenous veins. Electronically Signed   By: Inez Catalina M.D.   On: 08/04/2021 19:34   CT Angio Chest PE W and/or Wo Contrast  Result Date: 08/04/2021 CLINICAL DATA:  Chest pain. EXAM: CT ANGIOGRAPHY CHEST WITH CONTRAST TECHNIQUE: Multidetector CT imaging of the chest was performed using the standard protocol during bolus administration of intravenous contrast. Multiplanar CT image reconstructions and MIPs were obtained to evaluate the vascular anatomy. RADIATION DOSE REDUCTION: This exam was performed according to the departmental dose-optimization program which includes automated exposure control, adjustment of the mA and/or kV according to patient size and/or use of iterative reconstruction technique. CONTRAST:  6mL OMNIPAQUE IOHEXOL 350 MG/ML SOLN COMPARISON:  May 15, 2021. FINDINGS: Cardiovascular: 2 small peripheral filling defects are noted in peripheral upper and middle lobe branches of the right pulmonary artery. Normal cardiac size. No pericardial effusion. Coronary calcifications are noted. 4.1 cm ascending thoracic aortic aneurysm. Mediastinum/Nodes: No enlarged mediastinal, hilar, or axillary lymph nodes. Thyroid gland, trachea, and esophagus demonstrate no  significant findings. Lungs/Pleura: Emphysematous disease is noted bilaterally. No pneumothorax or pleural effusion is noted large right upper and lower lobe airspace opacities are noted concerning for pneumonia. Stable probable scarring is noted in left upper lobe. Upper Abdomen: No acute abnormality. Musculoskeletal: No chest wall abnormality. No acute or significant osseous findings. Review of the MIP images confirms the above  findings. IMPRESSION: Two small pulmonary emboli are noted in peripheral upper and middle lobe branches of the right pulmonary artery. Critical Value/emergent results were called by telephone at the time of interpretation on 08/04/2021 at 3:42 pm to provider Hulan Saas, MD, who verbally acknowledged these results. Large right upper and lower lobe airspace opacities are noted concerning for pneumonia. 4.0 cm ascending thoracic aortic aneurysm. Recommend annual imaging followup by CTA or MRA. This recommendation follows 2010 ACCF/AHA/AATS/ACR/ASA/SCA/SCAI/SIR/STS/SVM Guidelines for the Diagnosis and Management of Patients with Thoracic Aortic Disease. Circulation. 2010; 121: Z610-R604. Aortic aneurysm NOS (ICD10-I71.9). Coronary artery calcifications are noted. Aortic Atherosclerosis (ICD10-I70.0) and Emphysema (ICD10-J43.9). Electronically Signed   By: Marijo Conception M.D.   On: 08/04/2021 15:43   DG Chest 2 View  Result Date: 08/04/2021 CLINICAL DATA:  Chest pain. EXAM: CHEST - 2 VIEW COMPARISON:  07/14/2021 FINDINGS: Interval increase in consolidative opacity in the right parahilar lung with marked architectural distortion in the right mid lung. Bandlike linear density in the left upper lobe is stable. No evidence for pneumothorax or pleural effusion. The cardiopericardial silhouette is within normal limits for size. Telemetry leads overlie the chest. IMPRESSION: Interval increase in consolidative opacity in the right parahilar lung. Findings may reflect progressive pneumonia although recent CT of 05/15/2021 was concerning for neoplasm. Repeat CT chest with contrast may prove helpful to further evaluate. Electronically Signed   By: Misty Stanley M.D.   On: 08/04/2021 14:36   DG Chest 2 View  Result Date: 07/14/2021 CLINICAL DATA:  Chest pain EXAM: CHEST - 2 VIEW COMPARISON:  X-ray 03/08/2021, CT 05/15/2021 FINDINGS: Normal heart size. Coronary artery stents. Hyperinflated, hyperlucent lungs. Extensive  airspace opacity is again seen within the right mid lung involving the right upper and right lower lobes, most confluent posteriorly. Scattered nodular densities bilaterally, better assessed by recent CT. No pleural effusion or pneumothorax. IMPRESSION: 1. Persistent extensive airspace opacity within the right mid lung, may reflect infection and/or neoplasm. 2. Scattered nodular densities bilaterally, better assessed by recent CT. Electronically Signed   By: Davina Poke D.O.   On: 07/14/2021 14:54    Microbiology: Results for orders placed or performed during the hospital encounter of 08/04/21  SARS Coronavirus 2 by RT PCR (hospital order, performed in [facility] hospital lab) *cepheid single result test* Anterior Nasal Swab     Status: None   Collection Time: 08/04/21  3:14 PM   Specimen: Anterior Nasal Swab  Result Value Ref Range Status   SARS Coronavirus 2 by RT PCR NEGATIVE NEGATIVE Final    Comment: (NOTE) SARS-CoV-2 target nucleic acids are NOT DETECTED.  The SARS-CoV-2 RNA is generally detectable in upper and lower respiratory specimens during the acute phase of infection. The lowest concentration of SARS-CoV-2 viral copies this assay can detect is 250 copies / mL. A negative result does not preclude SARS-CoV-2 infection and should not be used as the sole basis for treatment or other patient management decisions.  A negative result may occur with improper specimen collection / handling, submission of specimen other than nasopharyngeal swab, presence of viral mutation(s) within the areas targeted by this assay,  and inadequate number of viral copies (<250 copies / mL). A negative result must be combined with clinical observations, patient history, and epidemiological information.  Fact Sheet for Patients:   https://www.patel.info/  Fact Sheet for Healthcare Providers: https://hall.com/  This test is not yet approved or  cleared by  the [place] FDA and has been authorized for detection and/or diagnosis of SARS-CoV-2 by FDA under an Emergency Use Authorization (EUA).  This EUA will remain in effect (meaning this test can be used) for the duration of the COVID-19 declaration under Section 564(b)(1) of the Act, 21 U.S.C. section 360bbb-3(b)(1), unless the authorization is terminated or revoked sooner.  Performed at [facility], [address] [place], [place] [address]   Blood culture (routine x 2)     Status: None (Preliminary result)   Collection Time: 08/04/21  6:14 PM   Specimen: BLOOD  Result Value Ref Range Status   Specimen Description BLOOD BLOOD RIGHT ARM  Final   Special Requests   Final    BOTTLES DRAWN AEROBIC AND ANAEROBIC Blood Culture adequate volume   Culture   Final    NO GROWTH 3 DAYS Performed at [facility], [address], [place], [place] [address]    Report Status PENDING  Incomplete  Blood culture (routine x 2)     Status: None (Preliminary result)   Collection Time: 08/04/21  6:14 PM   Specimen: BLOOD  Result Value Ref Range Status   Specimen Description BLOOD Blood Culture adequate volume  Final   Special Requests   Final    BOTTLES DRAWN AEROBIC AND ANAEROBIC BLOOD RIGHT ARM   Culture   Final    NO GROWTH 3 DAYS Performed at [facility], [address] [place], [place] [address]    Report Status PENDING  Incomplete  Expectorated Sputum Assessment w Gram Stain, Rflx to Resp Cult     Status: None   Collection Time: 08/04/21  6:45 PM   Specimen: Expectorated Sputum  Result Value Ref Range Status   Specimen Description EXPECTORATED SPUTUM  Final   Special Requests NONE  Final   Sputum evaluation   Final    THIS SPECIMEN IS ACCEPTABLE FOR SPUTUM CULTURE Performed at [facility], [address] [place], [place] [address]    Report Status 08/04/2021 FINAL  Final  Culture, Respiratory w Gram Stain     Status: None (Preliminary  result)   Collection Time: 08/04/21  6:45 PM  Result Value Ref Range Status   Specimen Description   Final    EXPECTORATED SPUTUM Performed at [facility], [address], [place], [place] [address]    Special Requests   Final    NONE Reflexed from [phone] Performed at [facility], [address] [place], [place] [address]    Gram Stain   Final    FEW WBC PRESENT,BOTH PMN AND MONONUCLEAR FEW GRAM POSITIVE COCCI IN PAIRS FEW GRAM VARIABLE ROD    Culture   Final    CULTURE REINCUBATED FOR BETTER GROWTH Performed at [facility], [place] [address], [place], [place] [address]    Report Status PENDING  Incomplete  MRSA Next Gen by PCR, Nasal     Status: None   Collection Time: 08/04/21 10:45 PM   Specimen: Nasal Mucosa; Nasal Swab  Result Value Ref Range Status   MRSA by PCR Next Gen NOT DETECTED NOT DETECTED Final    Comment: (NOTE) The GeneXpert MRSA Assay (FDA  approved for NASAL specimens only), is one component of a comprehensive MRSA colonization surveillance program. It is not intended to diagnose MRSA infection nor to guide or monitor treatment for MRSA infections. Test performance is not FDA approved in patients less than 85 years old. Performed at [facility], [address] [place], [place] [address]     Labs: CBC: Recent Labs  Lab 08/04/21 1355 08/05/21 0801 08/06/21 0541  WBC 10.7* 10.1 8.2  HGB 13.1 11.2* 11.7*  HCT 39.9 33.9* 35.4*  MCV 89.1 87.8 87.8  PLT 457* 368 696   Basic Metabolic Panel: Recent Labs  Lab 08/04/21 1355  NA 138  K 3.8  CL 103  CO2 23  GLUCOSE 98  BUN 24*  CREATININE 0.91  CALCIUM 8.6*   Liver Function Tests: No results for input(s): "AST", "ALT", "ALKPHOS", "BILITOT", "PROT", "ALBUMIN" in the last 168 hours. CBG: No results for input(s): "GLUCAP" in the last 168 hours.  Discharge time spent: greater than 30 minutes.  Signed: Jennye Boroughs, MD [facility] 08/07/2021

## 2021-08-07 NOTE — TOC Benefit Eligibility Note (Signed)
Patient Teacher, English as a foreign language completed.    The patient is currently admitted and upon discharge could be taking Eliquis 5MG .  The current 30 day co-pay is $0.00.   The patient is insured through [employer] Patient Advocate Specialist Phone: [phone]

## 2021-08-07 NOTE — Telephone Encounter (Signed)
Barium swallow performed today.  Rodena Piety, please cancel scheduled Barium swallow. Thanks

## 2021-08-07 NOTE — Telephone Encounter (Signed)
Pharmacy Patient Advocate Encounter  Insurance verification completed.    The patient is insured through [employer].  The patient is currently admitted and ran test claims for the following: Eliquis.  Copays and coinsurance results were relayed to Inpatient clinical team.

## 2021-08-08 ENCOUNTER — Other Ambulatory Visit: Payer: Self-pay | Admitting: Internal Medicine

## 2021-08-09 ENCOUNTER — Telehealth: Payer: Self-pay | Admitting: Pulmonary Disease

## 2021-08-09 LAB — CULTURE, BLOOD (ROUTINE X 2)
Culture: NO GROWTH
Culture: NO GROWTH
Special Requests: ADEQUATE
Specimen Description: ADEQUATE

## 2021-08-09 NOTE — Telephone Encounter (Signed)
Please see 07/25/2021 phone note.   Patient stated that he has been unsuccessful at obtaining CT disc.  I have made Dr. Patsey Berthold aware of this verbally. She stated that patient recently had imaging during recent admission. Disc is not needed at this time.

## 2021-08-11 ENCOUNTER — Other Ambulatory Visit: Payer: Managed Care, Other (non HMO)

## 2021-08-12 ENCOUNTER — Other Ambulatory Visit: Payer: Self-pay | Admitting: Pulmonary Disease

## 2021-08-16 ENCOUNTER — Telehealth: Payer: Self-pay | Admitting: Pulmonary Disease

## 2021-08-16 NOTE — Telephone Encounter (Signed)
Unfortunately, given all of the symptoms, he needs to go back to the emergency room as his pain will have to be controlled with medications likely intravenously.

## 2021-08-16 NOTE — Telephone Encounter (Signed)
Called and spoke with patient, provided recommendations per Dr. Patsey Berthold.  He could not understand why he could not be prescribed pain medication by his doctor like other people.  He said he is about done with her.  "If I have any upcoming appointments, you can cancel them".  I again advised him to be evaluated in the ER as he has been in pain for a week since his d/c from the hospital and he needs to be re evaluated.  Nothing further needed.

## 2021-08-16 NOTE — Telephone Encounter (Signed)
Primary Pulmonologist: Patsey Berthold Last office visit and with whom: 07/25/2021  What do we see them for (pulmonary problems): stage 4 COPD, cryptogenic organizing pna, aspiration, nocturnal hypoxemia, chronic CHF, tobacco use Last OV assessment/plan:   I had a frank discussion with Mr. Eifler today.  His lung disease is quite advanced.  It has been difficult to sort out the issues with his lung findings on CT which have improved to some extent but not completely.  Biopsy was suspicious of few atypical cells but not conclusive of malignancy due to the extensive inflammatory and necrotizing process noted.  We will continue to treat for infectious etiology which was noted on most recent CT at [facility] by report (potential aspiration).  The patient will likely need rebiopsy once inflammatory/infectious changes subside.  Will evaluate dysphagia and potential aspiration with swallow evaluation.  He is not enthusiastic about this because he does not want to "be poked on any further" as per prior conversations.  We will repeat CT scan after antibiotic therapy is completed and after swallow evaluation.  Follow-up here in 6 to 8 weeks time however we will stay in contact with him with results of testing prior to that time.   Renold Don, MD Advanced Bronchoscopy PCCM [facility]    *This note was dictated using voice recognition software/Dragon.  Despite best efforts to proofread, errors can occur which can change the meaning. Any transcriptional errors that result from this process are unintentional and may not be fully corrected at the time of dictation.        Patient Instructions by Tyler Pita, MD at 07/25/2021 8:30 AM  Author: Tyler Pita, MD Author Type: Physician Filed: 07/25/2021  9:01 AM  Note Status: Addendum Cosign: Cosign Not Required Encounter Date: 07/25/2021  Editor: Tyler Pita, MD (Physician)      Prior Versions: 1. Tyler Pita, MD (Physician)  at 07/25/2021  8:59 AM - Signed  We have increased the dose of your Azithromycin to 500 mg Monday Wednesday and Fridays.   We have decreased the prednisone to 10 mg daily.   We are ordering a swallow evaluation.   We are going to repeat a CT scan in 4 to 6 weeks, follow-up with [place] in 6 to 8 weeks either me or the nurse practitioner.       Orthostatic Vitals Recorded in This Encounter   07/25/2021  0837     BP Location: Left Arm  Cuff Size: Normal   Instructions  We have increased the dose of your Azithromycin to 500 mg Monday Wednesday and Fridays.   We have decreased the prednisone to 10 mg daily.   We are ordering a swallow evaluation.   We are going to repeat a CT scan in 4 to 6 weeks, follow-up with [place] in 6 to 8 weeks either me or the nurse practitioner.        Reason for call: Patient was in the hospital for 4 days with a blood clot in in his let and lung.  He is having excruciating pain in his chest when he tries to take a deep breath.  He is only able to take shallow breaths.  He was on 2L oxygen at HS, he is now wearing the oxygen all the time and turned it up to 6L and cannot tell any difference.  He also has severe paine in his leg.  He was given 3-4 oxycodone and he stretched them out as long as he could  and it only took the edge off the pain.  He is using his duoneb.  He is on the blood thinner as instructed.  Advised I would send a message to Dr. Patsey Berthold and once we hear back we would call him back.  Dr. Patsey Berthold, please advise.  Thank you.  (examples of things to ask: : When did symptoms start? Fever? Cough? Productive? Color to sputum? More sputum than usual? Wheezing? Have you needed increased oxygen? Are you taking your respiratory medications? What over the counter measures have you tried?)  Allergies  Allergen Reactions   Sulfa Antibiotics Rash    There is no immunization history for the selected administration types on file for this patient.

## 2021-08-17 ENCOUNTER — Telehealth: Payer: Self-pay | Admitting: Internal Medicine

## 2021-08-17 NOTE — Telephone Encounter (Signed)
Spoke with pt. Pt refuses appt with Thurmond Butts, states his insurance does not cover APP.   Spoke with manager, notified we may add on to Dr. Darnelle Bos scheduled next week 08/24/21 at 10 am.   Notified pt I have r/s his appt to see Dr. Saunders Revel 08/24/21 at 10 am and to arrive by 9:45 am.   Pt voiced understanding.

## 2021-08-17 NOTE — Telephone Encounter (Signed)
Patient mom Clinton Walsh came by office  Wants to make sure that Dr End sees what is going on with son  Requesting to speak with someone today about situation

## 2021-08-17 NOTE — Telephone Encounter (Signed)
Spoke with pt's mother Clinton Walsh in the office. Notified Clinton Walsh that she is not listed on pt's DPR and I can only take down information and unable to provide any pt information. Clinton Walsh voiced understanding. Clinton Walsh wants our office to know that pt has had 2 blood clots in leg and right lung, discharged from the hospital 08/07/21. Clinton Walsh explains pt is in constant pain and is now concerned since his appt with Dr. Saunders Revel was cancelled that was scheduled tomorrow 08/18/21. Clinton Walsh understands that Dr. Saunders Revel is not in the office this week and appointments had to be rescheduled that required in person visits. Pt has a follow up scheduled currently with Clinton Bayley, NP 10/04/21 in office. Pt is also following up with PCP regarding pain management per Clinton Walsh.   Due to no DPR, advised Clinton Walsh to have pt or someone on his DPR to call the office so I can further assist.  Clinton Walsh appreciative and voiced understanding.   After Clinton Walsh left office, I attempted to call pt and pt's wife that is listed on DPR to discuss scheduling sooner appt with APP. No answer. Lmtcb on pt's cell phone. Wife does not have vm.   Scheduled appt with Clinton Faith, PA-C for Monday 08/21/21 at 9:15 am. Pt has not been notified of this appt. Left detailed message that I have scheduled this appt on vm and advised pt call back to confirm this appt. Since pt was to be seen tomorrow, scheduled soonest appt for Monday.

## 2021-08-17 NOTE — Telephone Encounter (Signed)
Patient returned call.  He stated he can't see the PA as his insurance won't cover it.  He can only see Dr. Saunders Revel.

## 2021-08-18 ENCOUNTER — Ambulatory Visit: Payer: Managed Care, Other (non HMO) | Admitting: Internal Medicine

## 2021-08-21 ENCOUNTER — Ambulatory Visit: Payer: Managed Care, Other (non HMO) | Admitting: Physician Assistant
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
## 2021-08-24 ENCOUNTER — Encounter: Payer: Self-pay | Admitting: Internal Medicine

## 2021-08-24 ENCOUNTER — Ambulatory Visit (INDEPENDENT_AMBULATORY_CARE_PROVIDER_SITE_OTHER): Payer: Managed Care, Other (non HMO) | Admitting: Internal Medicine

## 2021-08-24 VITALS — BP 106/72 | HR 91 | Ht 65.0 in | Wt 102.0 lb

## 2021-08-24 DIAGNOSIS — I5022 Chronic systolic (congestive) heart failure: Secondary | ICD-10-CM

## 2021-08-24 DIAGNOSIS — I2609 Other pulmonary embolism with acute cor pulmonale: Secondary | ICD-10-CM

## 2021-08-24 DIAGNOSIS — J449 Chronic obstructive pulmonary disease, unspecified: Secondary | ICD-10-CM

## 2021-08-24 DIAGNOSIS — J84116 Cryptogenic organizing pneumonia: Secondary | ICD-10-CM | POA: Diagnosis not present

## 2021-08-24 DIAGNOSIS — I25118 Atherosclerotic heart disease of native coronary artery with other forms of angina pectoris: Secondary | ICD-10-CM

## 2021-08-24 MED ORDER — OXYCODONE HCL 5 MG PO TABS: 5.0000 mg | ORAL_TABLET | Freq: Four times a day (QID) | ORAL | 0 refills | Status: DC | PRN

## 2021-08-24 MED ORDER — ATORVASTATIN CALCIUM 10 MG PO TABS: 10.0000 mg | ORAL_TABLET | Freq: Every day | ORAL | 3 refills | Status: DC

## 2021-08-24 NOTE — Patient Instructions (Signed)
Medication Instructions:  Your physician has recommended you make the following change in your medication:   START Atorvastatin 10 mg daily. An Rx has been sent to your pharmacy.  A refill for Oxycodone 5 mg every six hors as needed for intense pain has been sent to your pharmacy.  *If you need a refill on your cardiac medications before your next appointment, please call your pharmacy*   Lab Work: None ordered If you have labs (blood work) drawn today and your tests are completely normal, you will receive your results only by: [place] (if you have MyChart) OR A paper copy in the mail If you have any lab test that is abnormal or we need to change your treatment, we will call you to review the results.   Testing/Procedures: None ordered   Follow-Up: At [facility], you and your health needs are our priority.  As part of our continuing mission to provide you with exceptional heart care, we have created designated Provider Care Teams.  These Care Teams include your primary Cardiologist (physician) and Advanced Practice Providers (APPs -  Physician Assistants and Nurse Practitioners) who all work together to provide you with the care you need, when you need it.  We recommend signing up for the patient portal called "MyChart".  Sign up information is provided on this After Visit Summary.  MyChart is used to connect with patients for Virtual Visits (Telemedicine).  Patients are able to view lab/test results, encounter notes, upcoming appointments, etc.  Non-urgent messages can be sent to your provider as well.   To learn more about what you can do with MyChart, go to [email or web address].    Your next appointment:   4-6 week(s)  The format for your next appointment:   In Person  Provider:   You may see [name], MD or one of the following Advanced Practice Providers on your designated Care Team:   Murray Hodgkins, NP Christell Faith, PA-C Cadence Kathlen Mody,  PA-C{    Other Instructions You have been referred to [facility]. Their office will contact you to schedule.   Important Information About [place]

## 2021-09-08 ENCOUNTER — Other Ambulatory Visit: Payer: Self-pay | Admitting: Internal Medicine

## 2021-09-13 ENCOUNTER — Telehealth: Payer: Self-pay | Admitting: Internal Medicine

## 2021-09-13 ENCOUNTER — Other Ambulatory Visit: Payer: Self-pay

## 2021-09-13 MED ORDER — APIXABAN 5 MG PO TABS: 5.0000 mg | ORAL_TABLET | Freq: Two times a day (BID) | ORAL | 1 refills | Status: DC

## 2021-09-13 NOTE — Telephone Encounter (Signed)
Refill request

## 2021-09-13 NOTE — Telephone Encounter (Signed)
*  STAT* If patient is at the pharmacy, call can be transferred to refill team.   1. Which medications need to be refilled? (please list name of each medication and dose if known) new prescription for Eliquis  2. Which pharmacy/location (including street and city if local pharmacy) is medication to be sent to? [facility]  [place] [place]  3. Do they need a 30 day or 90 day supply? 90 days and refills

## 2021-09-13 NOTE — Telephone Encounter (Signed)
Prescription refill request for Eliquis received. Indication:PE Last office visit:8/23 Scr:0.9 Age: 61 Weight:46.3 kg  Prescription refilled

## 2021-09-26 ENCOUNTER — Telehealth: Payer: Self-pay

## 2021-09-26 NOTE — Telephone Encounter (Signed)
Patient is scheduled to see Dr. Patsey Berthold 09/27/2021. It appears that patient is currently seeing Dr. Raul Del.   Lm for patient.

## 2021-09-27 ENCOUNTER — Ambulatory Visit: Payer: Managed Care, Other (non HMO) | Admitting: Pulmonary Disease

## 2021-09-27 NOTE — Telephone Encounter (Signed)
Patient no showed for appt.  Will close encounter.

## 2021-10-03 ENCOUNTER — Other Ambulatory Visit: Payer: Self-pay | Admitting: Internal Medicine

## 2021-10-04 ENCOUNTER — Ambulatory Visit: Payer: Managed Care, Other (non HMO) | Attending: Internal Medicine | Admitting: Nurse Practitioner

## 2021-10-05 ENCOUNTER — Encounter: Payer: Self-pay | Admitting: Nurse Practitioner

## 2021-10-28 ENCOUNTER — Ambulatory Visit
Admission: EM | Admit: 2021-10-28 | Discharge: 2021-10-28 | Disposition: A | Discharge status: Home / Self Care | Payer: Managed Care, Other (non HMO) | Attending: Emergency Medicine | Admitting: Emergency Medicine

## 2021-10-28 ENCOUNTER — Ambulatory Visit (INDEPENDENT_AMBULATORY_CARE_PROVIDER_SITE_OTHER): Payer: Managed Care, Other (non HMO)

## 2021-10-28 DIAGNOSIS — M545 Low back pain, unspecified: Secondary | ICD-10-CM

## 2021-10-28 DIAGNOSIS — R634 Abnormal weight loss: Secondary | ICD-10-CM

## 2021-10-28 HISTORY — DX: Chronic obstructive pulmonary disease, unspecified: J44.9

## 2021-10-28 MED ORDER — OXYCODONE HCL 5 MG PO TABS: 5.0000 mg | ORAL_TABLET | Freq: Every day | ORAL | 0 refills | Status: DC | PRN

## 2021-10-28 MED ORDER — METHOCARBAMOL 750 MG PO TABS: 750.0000 mg | ORAL_TABLET | Freq: Four times a day (QID) | ORAL | 1 refills | Status: AC
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
## 2021-10-28 NOTE — ED Triage Notes (Addendum)
Patient states he is having lower back pain x day 4. Pt states 2 knots on the lower back and the pain is causing trouble with walking. "Sometimes pain run down both legs, the right side is worse."

## 2021-10-28 NOTE — Discharge Instructions (Addendum)
You were seen for back pain and are being treated for the same.   -You are being prescribed a narcotic medication. This is only to be taken for breakthrough pain (the ibuprofen/tylenol isn't working). Make sure you keep it in a safe place. -Treat your pain with high-dose acetaminophen (Tylenol): 1000 mg up to 3 times a day with food. -Increase oral intake with high-protein shakes high caloric foods. -Follow-up with primary care provider as soon as possible.  Take care, Dr. Marland Kitchen, NP-c

## 2021-10-30 ENCOUNTER — Encounter (INDEPENDENT_AMBULATORY_CARE_PROVIDER_SITE_OTHER): Payer: Self-pay

## 2021-11-05 ENCOUNTER — Other Ambulatory Visit: Payer: Self-pay | Admitting: Internal Medicine

## 2021-11-06 ENCOUNTER — Ambulatory Visit
Admission: RE | Admit: 2021-11-06 | Discharge: 2021-11-06 | Disposition: A | Discharge status: Home / Self Care | Payer: Managed Care, Other (non HMO) | Source: Ambulatory Visit | Attending: Internal Medicine | Admitting: Internal Medicine

## 2021-11-06 ENCOUNTER — Encounter: Payer: Self-pay | Admitting: Internal Medicine

## 2021-11-06 ENCOUNTER — Ambulatory Visit (INDEPENDENT_AMBULATORY_CARE_PROVIDER_SITE_OTHER): Payer: Managed Care, Other (non HMO) | Admitting: Internal Medicine

## 2021-11-06 ENCOUNTER — Ambulatory Visit: Payer: Self-pay | Admitting: *Deleted

## 2021-11-06 VITALS — BP 124/72 | HR 102 | Temp 97.6°F | Ht 65.0 in | Wt 99.8 lb

## 2021-11-06 DIAGNOSIS — J449 Chronic obstructive pulmonary disease, unspecified: Secondary | ICD-10-CM

## 2021-11-06 DIAGNOSIS — I2609 Other pulmonary embolism with acute cor pulmonale: Secondary | ICD-10-CM | POA: Diagnosis not present

## 2021-11-06 DIAGNOSIS — R1011 Right upper quadrant pain: Secondary | ICD-10-CM | POA: Insufficient documentation

## 2021-11-06 DIAGNOSIS — J84116 Cryptogenic organizing pneumonia: Secondary | ICD-10-CM | POA: Diagnosis not present

## 2021-11-06 MED ORDER — HYDROCODONE-ACETAMINOPHEN 5-325 MG PO TABS: 1.0000 | ORAL_TABLET | Freq: Four times a day (QID) | ORAL | 0 refills | Status: AC | PRN

## 2021-11-06 NOTE — Telephone Encounter (Signed)
Could you schedule a follow up visit for this patient? They were last seen by Dr. Saunders Revel on 08-24-21 and he wanted them to come back in 4-6 weeks. Thank you so much.

## 2021-11-06 NOTE — Telephone Encounter (Signed)
Reason for Disposition  [1] MILD-MODERATE pain AND [2] constant AND [3] present > 2 hours  Answer Assessment - Initial Assessment Questions 1. LOCATION: "Where does it hurt?"      *No Answer* 2. RADIATION: "Does the pain shoot anywhere else?" (e.g., chest, back)     R side under Rib 3. ONSET: "When did the pain begin?" (Minutes, hours or days ago)      1 week 4. SUDDEN: "Gradual or sudden onset?"     sudden 5. PATTERN "Does the pain come and go, or is it constant?"    - If it comes and goes: "How long does it last?" "Do you have pain now?"     (Note: Comes and goes means the pain is intermittent. It goes away completely between bouts.)    - If constant: "Is it getting better, staying the same, or getting worse?"      (Note: Constant means the pain never goes away completely; most serious pain is constant and gets worse.)      Dull pain constant- gets worse at times 6. SEVERITY: "How bad is the pain?"  (e.g., Scale 1-10; mild, moderate, or severe)    - MILD (1-3): Doesn't interfere with normal activities, abdomen soft and not tender to touch.     - MODERATE (4-7): Interferes with normal activities or awakens from sleep, abdomen tender to touch.     - SEVERE (8-10): Excruciating pain, doubled over, unable to do any normal activities.       moderate 7. RECURRENT SYMPTOM: "Have you ever had this type of stomach pain before?" If Yes, ask: "When was the last time?" and "What happened that time?"      Appendicitis   8. CAUSE: "What do you think is causing the stomach pain?"     Not sure- eating 9. RELIEVING/AGGRAVATING FACTORS: "What makes it better or worse?" (e.g., antacids, bending or twisting motion, bowel movement)     Gets worse with eating 10. OTHER SYMPTOMS: "Do you have any other symptoms?" (e.g., back pain, diarrhea, fever, urination pain, vomiting)       Diarrhea, vomiting  Protocols used: Abdominal Pain - Male-A-AH

## 2021-11-06 NOTE — Patient Instructions (Addendum)
You have an ultrasound scheduled for 2:15 today in [facility] on [address].  Do not eat anything until after the exam.

## 2021-11-06 NOTE — Telephone Encounter (Signed)
  Chief Complaint: abdominal pain  Symptoms: URQ pain- worse with eating Frequency: 1 week Pertinent Negatives: Patient denies fever Disposition: [] ED /[] Urgent Care (no appt availability in office) / [x] Appointment(In office/virtual)/ []  [place] [facility]/ [] Home Care/ [] Refused Recommended Disposition /[] [facility]/ []  Follow-up with PCP Additional Notes: Unhappy with triage- just wants to schedule appointment with provider
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
## 2021-11-06 NOTE — Telephone Encounter (Signed)
LVM to schedule fu appt, please schedule.

## 2021-11-07 NOTE — Telephone Encounter (Signed)
Please review.  KP

## 2021-12-01 ENCOUNTER — Other Ambulatory Visit: Payer: Self-pay | Admitting: Specialist

## 2021-12-01 DIAGNOSIS — R0602 Shortness of breath: Secondary | ICD-10-CM

## 2021-12-01 DIAGNOSIS — J189 Pneumonia, unspecified organism: Secondary | ICD-10-CM

## 2021-12-02 ENCOUNTER — Other Ambulatory Visit: Payer: Self-pay | Admitting: Internal Medicine

## 2021-12-04 ENCOUNTER — Other Ambulatory Visit: Payer: Self-pay | Admitting: Internal Medicine

## 2021-12-04 NOTE — Telephone Encounter (Signed)
Please contact patient for overdue 4-6 week follow up, last seen 08-24-21. Refill pending appt. Thank you.

## 2021-12-05 ENCOUNTER — Other Ambulatory Visit: Payer: Self-pay | Admitting: Pulmonary Disease

## 2021-12-06 ENCOUNTER — Telehealth: Payer: Self-pay | Admitting: Internal Medicine

## 2021-12-06 NOTE — Telephone Encounter (Signed)
LVM to schedule fu appt, please schedule

## 2021-12-13 ENCOUNTER — Other Ambulatory Visit: Payer: Self-pay | Admitting: Pulmonary Disease

## 2021-12-13 ENCOUNTER — Other Ambulatory Visit: Payer: Self-pay | Admitting: Specialist

## 2021-12-13 DIAGNOSIS — R1319 Other dysphagia: Secondary | ICD-10-CM

## 2021-12-15 ENCOUNTER — Ambulatory Visit: Payer: Managed Care, Other (non HMO) | Attending: Internal Medicine | Admitting: Internal Medicine

## 2021-12-15 ENCOUNTER — Encounter: Payer: Self-pay | Admitting: Internal Medicine

## 2021-12-15 ENCOUNTER — Other Ambulatory Visit
Admission: RE | Admit: 2021-12-15 | Discharge: 2021-12-15 | Disposition: A | Discharge status: Home / Self Care | Payer: Managed Care, Other (non HMO) | Source: Ambulatory Visit | Attending: Internal Medicine | Admitting: Internal Medicine

## 2021-12-15 VITALS — BP 116/80 | HR 106 | Ht 64.0 in | Wt 107.0 lb

## 2021-12-15 DIAGNOSIS — R0602 Shortness of breath: Secondary | ICD-10-CM

## 2021-12-15 DIAGNOSIS — I2510 Atherosclerotic heart disease of native coronary artery without angina pectoris: Secondary | ICD-10-CM | POA: Diagnosis not present

## 2021-12-15 DIAGNOSIS — Z79899 Other long term (current) drug therapy: Secondary | ICD-10-CM | POA: Diagnosis present

## 2021-12-15 DIAGNOSIS — J189 Pneumonia, unspecified organism: Secondary | ICD-10-CM | POA: Diagnosis not present

## 2021-12-15 DIAGNOSIS — Z86711 Personal history of pulmonary embolism: Secondary | ICD-10-CM

## 2021-12-15 DIAGNOSIS — I10 Essential (primary) hypertension: Secondary | ICD-10-CM

## 2021-12-15 DIAGNOSIS — R000 Tachycardia, unspecified: Secondary | ICD-10-CM

## 2021-12-15 LAB — CBC
HCT: 41.5 % (ref 39.0–52.0)
Hemoglobin: 12.7 g/dL — ABNORMAL LOW (ref 13.0–17.0)
MCH: 24.9 pg — ABNORMAL LOW (ref 26.0–34.0)
MCHC: 30.6 g/dL (ref 30.0–36.0)
MCV: 81.2 fL (ref 80.0–100.0)
Platelets: 515 10*3/uL — ABNORMAL HIGH (ref 150–400)
RBC: 5.11 MIL/uL (ref 4.22–5.81)
RDW: 18.1 % — ABNORMAL HIGH (ref 11.5–15.5)
WBC: 11.7 10*3/uL — ABNORMAL HIGH (ref 4.0–10.5)
nRBC: 0 % (ref 0.0–0.2)

## 2021-12-15 LAB — COMPREHENSIVE METABOLIC PANEL
ALT: 25 U/L (ref 0–44)
AST: 21 U/L (ref 15–41)
Albumin: 3 g/dL — ABNORMAL LOW (ref 3.5–5.0)
Alkaline Phosphatase: 71 U/L (ref 38–126)
Anion gap: 11 (ref 5–15)
BUN: 24 mg/dL — ABNORMAL HIGH (ref 6–20)
CO2: 26 mmol/L (ref 22–32)
Calcium: 9 mg/dL (ref 8.9–10.3)
Chloride: 101 mmol/L (ref 98–111)
Creatinine, Ser: 0.93 mg/dL (ref 0.61–1.24)
GFR, Estimated: >60 mL/min (ref >60)
Glucose, Bld: 120 mg/dL — ABNORMAL HIGH (ref 70–99)
Potassium: 4.5 mmol/L (ref 3.5–5.1)
Sodium: 138 mmol/L (ref 135–145)
Total Bilirubin: 0.6 mg/dL (ref 0.3–1.2)
Total Protein: 7.7 g/dL (ref 6.5–8.1)

## 2021-12-15 LAB — TSH: TSH: 1.948 u[IU]/mL (ref 0.350–4.500)
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
## 2021-12-15 NOTE — Patient Instructions (Signed)
Medication Instructions:  Your Physician recommend you continue on your current medication as directed.    *If you need a refill on your cardiac medications before your next appointment, please call your pharmacy*   Lab Work: Your provider would like for you to have following labs drawn: (CBC, CMP, TSH).   Please go to the [facility] entrance and check in at the front desk.  You do not need an appointment.  They are open from 7am-6 pm.   If you have labs (blood work) drawn today and your tests are completely normal, you will receive your results only by: [place] (if you have MyChart) OR A paper copy in the mail If you have any lab test that is abnormal or we need to change your treatment, we will call you to review the results.   Testing/Procedures: None ordered today   Follow-Up: At [facility], you and your health needs are our priority.  As part of our continuing mission to provide you with exceptional heart care, we have created designated Provider Care Teams.  These Care Teams include your primary Cardiologist (physician) and Advanced Practice Providers (APPs -  Physician Assistants and Nurse Practitioners) who all work together to provide you with the care you need, when you need it.  We recommend signing up for the patient portal called "MyChart".  Sign up information is provided on this After Visit Summary.  MyChart is used to connect with patients for Virtual Visits (Telemedicine).  Patients are able to view lab/test results, encounter notes, upcoming appointments, etc.  Non-urgent messages can be sent to your provider as well.   To learn more about what you can do with MyChart, go to [email or web address].    Your next appointment:   3 month(s)  The format for your next appointment:   In Person  Provider:   You may see [name], MD or one of the following Advanced Practice Providers on your designated Care Team:   Murray Hodgkins,  NP Christell Faith, PA-C Cadence Kathlen Mody, PA-C Gerrie Nordmann, NP

## 2021-12-24 ENCOUNTER — Other Ambulatory Visit: Payer: Self-pay | Admitting: Pulmonary Disease

## 2021-12-26 ENCOUNTER — Other Ambulatory Visit: Payer: Self-pay | Admitting: Specialist

## 2021-12-26 DIAGNOSIS — R1319 Other dysphagia: Secondary | ICD-10-CM

## 2021-12-27 ENCOUNTER — Ambulatory Visit
Admission: RE | Admit: 2021-12-27 | Discharge: 2021-12-27 | Disposition: A | Discharge status: Home / Self Care | Payer: Managed Care, Other (non HMO) | Source: Ambulatory Visit | Attending: Specialist | Admitting: Specialist

## 2021-12-27 DIAGNOSIS — R0602 Shortness of breath: Secondary | ICD-10-CM

## 2021-12-27 DIAGNOSIS — J189 Pneumonia, unspecified organism: Secondary | ICD-10-CM

## 2021-12-27 HISTORY — DX: Pneumonia, unspecified organism: J18.9

## 2022-01-03 ENCOUNTER — Telehealth: Payer: Self-pay | Admitting: Internal Medicine

## 2022-01-03 MED ORDER — AMLODIPINE BESYLATE 2.5 MG PO TABS: 2.5000 mg | ORAL_TABLET | Freq: Every day | ORAL | 3 refills | Status: DC

## 2022-01-03 NOTE — Telephone Encounter (Signed)
Pt c/o medication issue:  1. Name of Medication: amLODipine (NORVASC) 2.5 MG tablet   2. How are you currently taking this medication (dosage and times per day)? As written  3. Are you having a reaction (difficulty breathing--STAT)? No   4. What is your medication issue? Patient needs a new 3 refill, 90 day prescription sent to [id number] - [place], [place]

## 2022-01-03 NOTE — Telephone Encounter (Signed)
Refill for amlodipine 2.5 mg sent to pharmacy as requested

## 2022-01-12 ENCOUNTER — Telehealth: Payer: Self-pay | Admitting: Internal Medicine

## 2022-01-12 NOTE — Telephone Encounter (Signed)
Clinton Walsh, we have received request for cardiac clearance for upcoming bronchoalveolar lavage. Due to recent office visit with you on 12/15/21, would you please comment on agreement for him to proceed to surgery without further cardiac testing and agreement that he can hold aspirin due to length of stent in LAD > 25 mm.  Please send your response to p cv div preop.  Will ask pulmonology to address Eliquis hold due to hx of PE.  Thank you, Marcelino Duster

## 2022-01-12 NOTE — Telephone Encounter (Signed)
   Pre-operative Risk Assessment    Patient Name: Clinton Walsh  DOB: [birth year] MRN: [id number]      Request for Surgical Clearance    Procedure:   Bronchoalveolar lavage  Date of Surgery:  Clearance 02/07/22                                 Surgeon:  Dr Vida Rigger Surgeon's Group or Practice Name:  [facility] Phone number:  [phone] Fax number:  [phone]   Type of Clearance Requested:   - Pharmacy:  Hold Aspirin and Eliquis instructions   Type of Anesthesia:  General    Additional requests/questions:    SignedNorman Herrlich   01/12/2022, 2:49 PM

## 2022-01-15 NOTE — Telephone Encounter (Signed)
   Patient Name: LARKIN MORELOS  DOB: [birth year] MRN: [id number]  Primary Cardiologist: Yvonne Kendall, MD  Chart reviewed as part of pre-operative protocol coverage. Given past medical history and time since last visit, based on ACC/AHA guidelines, Levon H Droge is at acceptable risk for the planned procedure without further cardiovascular testing.   Guidelines for patients Eliquis should come from Dr. Meredeth Ide.  Regarding his ASA 81 mg this should be continued through the perioperative period due to his history of multiple PCI's per Dr. Okey Dupre.  I will route this recommendation to the requesting party via Epic fax function and remove from pre-op pool.  Please call with questions.  Napoleon Form, Leodis Rains, NP 01/15/2022, 3:08 PM

## 2022-01-15 NOTE — Telephone Encounter (Signed)
From a cardiac standpoint, I think it is fine for Clinton Walsh to proceed with bronchoscopy/bronchioalveolar lavage without further cardiac testing, as this is a low risk procedure for periprocedural cardiac events.  It is reasonable to hold apixaban at the discretion of Dr. Meredeth Ide, as he has been managing Clinton Walsh anticoagulation for pulmonary embolism.  Typically, apixaban would be held for 2 days before the procedure and restarted when it is safe to do so.  Aspirin 81 mg daily should be continued in the periprocedural period given history of multiple PCI's.  Yvonne Kendall, MD [facility]

## 2022-01-17 NOTE — Telephone Encounter (Signed)
Caller stated she would like a call back to discuss patient's clearance to hold Eliquis.

## 2022-01-17 NOTE — Telephone Encounter (Signed)
Returned patient's call and left a voicemail for patient to call our office back.

## 2022-01-29 ENCOUNTER — Telehealth: Payer: Self-pay | Admitting: *Deleted

## 2022-01-29 ENCOUNTER — Encounter
Admission: RE | Admit: 2022-01-29 | Discharge: 2022-01-29 | Disposition: A | Discharge status: Home / Self Care | Payer: Managed Care, Other (non HMO) | Source: Ambulatory Visit | Attending: Pulmonary Disease | Admitting: Pulmonary Disease

## 2022-01-29 VITALS — Ht 65.0 in | Wt 110.0 lb

## 2022-01-29 DIAGNOSIS — J9611 Chronic respiratory failure with hypoxia: Secondary | ICD-10-CM

## 2022-01-29 DIAGNOSIS — I10 Essential (primary) hypertension: Secondary | ICD-10-CM

## 2022-01-29 DIAGNOSIS — J449 Chronic obstructive pulmonary disease, unspecified: Secondary | ICD-10-CM

## 2022-01-29 NOTE — Telephone Encounter (Signed)
This is a duplicate request, we will remove from the pre-op pool list.  Trudi Ida, NP

## 2022-01-29 NOTE — Telephone Encounter (Signed)
-----  Message from Karen Kitchens, NP sent at 01/28/2022  5:37 PM EST ----- Regarding: Request for pre-operative cardiac clearance Request for pre-operative cardiac clearance:  1. What type of surgery is being performed?  RIGID BRONCHOSCOPY  2. When is this surgery scheduled?  02/07/2022  3. Type of clearance being requested (medical, pharmacy, both)? BOTH   4. Are there any medications that need to be held prior to surgery? ASA + APIXABAN  5. Practice name and name of physician performing surgery?  Performing surgeon: Dr. Ottie Glazier, MD Requesting clearance: Honor Loh, FNP-C    6. Anesthesia type (none, local, MAC, general)? GENERAL  7. What is the office phone and fax number?   Fax: [phone]  ATTENTION: Unable to create telephone message as per your standard workflow. Directed by [facility] providers to send requests for cardiac clearance to this pool for appropriate distribution to provider covering pre-operative clearances.   Honor Loh, MSN, APRN, FNP-C, CEN [facility]  Peri-operative Services Nurse Practitioner Phone: [phone] 01/28/22 5:37 PM

## 2022-01-29 NOTE — Telephone Encounter (Signed)
I will re-fax the original clearance notes from 01/12/22.

## 2022-01-29 NOTE — Patient Instructions (Addendum)
Your procedure is scheduled on: Wednesday 7, 2024. Report to Day Surgery inside [facility] 2nd floor, stop by registration desk before getting on elevator.  To find out your arrival time please call [phone] between 1PM - 3PM on Tuesday February 06, 2022.  Remember: Instructions that are not followed completely may result in serious medical risk,  up to and including death, or upon the discretion of your surgeon and anesthesiologist your  surgery may need to be rescheduled.     _X__ 1. Do not eat food after midnight the night before your procedure.                 No chewing gum or hard candies.   __X__2.  On the morning of surgery brush your teeth with toothpaste and water, you                may rinse your mouth with mouthwash if you wish.  Do not swallow any toothpaste or mouthwash.     _X__ 3.  No Alcohol for 24 hours before or after surgery.   _X__ 4.  Do Not Smoke or use e-cigarettes For 24 Hours Prior to Your Surgery.                 Do not use any chewable tobacco products for at least 6 hours prior to                 Surgery.  _X__  5.  Do not use any recreational drugs (marijuana, cocaine, heroin, ecstasy, MDMA or other)                For at least one week prior to your surgery.  Combination of these drugs with anesthesia                May have life threatening results.  ____  6.  Bring all medications with you on the day of surgery if instructed.   __X__  7.  Notify your doctor if there is any change in your medical condition      (cold, fever, infections).     Do not wear jewelry, make-up, hairpins, clips or nail polish. Do not wear lotions, powders, or perfumes. You may wear deodorant. Do not shave 48 hours prior to surgery. Men may shave face and neck. Do not bring valuables to the hospital.    [facility] is not responsible for any belongings or valuables.  Contacts, dentures or bridgework may not be worn into surgery. Leave your  suitcase in the car. After surgery it may be brought to your room. For patients admitted to the hospital, discharge time is determined by your treatment team.   Patients discharged the day of surgery will not be allowed to drive home.   Make arrangements for someone to be with you for the first 24 hours of your Same Day Discharge.   __X__ Take these medicines the morning of surgery with A SIP OF WATER:    1. amLODipine (NORVASC) 2.5 MG   2. amoxicillin-clavulanate (AUGMENTIN) 500-125 MG   3. pantoprazole (PROTONIX) 40 MG   4.  5.  6.  ____ Fleet Enema (as directed)   ____ Use CHG Soap (or wipes) as directed  ____ Use Benzoyl Peroxide Gel as instructed  __X__ Use inhalers on the day of surgery  ipratropium-albuterol (DUONEB) 0.5-2.5 (3) MG/3ML SOLN  ____ Stop metformin 2 days prior to surgery    ____ Take 1/2 of usual insulin  dose the night before surgery. No insulin the morning          of surgery.   __X__ Stop apixaban (ELIQUIS) 5 MG 2 days before your procedure ar instructed by your doctor.   __X__ One Week prior to surgery- Stop Anti-inflammatories such as Ibuprofen, Aleve, Advil, Motrin, meloxicam (MOBIC), diclofenac, etodolac, ketorolac, Toradol, Daypro, piroxicam, Goody's or BC powders. OK TO USE TYLENOL IF NEEDED   __X__ One week prior to surgery stop ALL vitamins and or supplements until after surgery.    ____ Bring C-Pap to the hospital.    If you have any questions regarding your pre-procedure instructions,  Please call Pre-admit Testing at [phone]

## 2022-01-30 ENCOUNTER — Encounter: Payer: Self-pay | Admitting: Urgent Care
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
## 2022-02-02 ENCOUNTER — Encounter: Payer: Self-pay | Admitting: Urgent Care

## 2022-02-05 ENCOUNTER — Other Ambulatory Visit: Payer: Self-pay | Admitting: Internal Medicine

## 2022-02-05 ENCOUNTER — Inpatient Hospital Stay: Admission: RE | Admit: 2022-02-05 | Payer: Managed Care, Other (non HMO) | Source: Ambulatory Visit

## 2022-02-05 ENCOUNTER — Encounter
Admission: RE | Admit: 2022-02-05 | Discharge: 2022-02-05 | Disposition: A | Discharge status: Home / Self Care | Payer: Managed Care, Other (non HMO) | Source: Ambulatory Visit | Attending: Pulmonary Disease | Admitting: Pulmonary Disease

## 2022-02-05 DIAGNOSIS — I5022 Chronic systolic (congestive) heart failure: Secondary | ICD-10-CM | POA: Diagnosis not present

## 2022-02-05 DIAGNOSIS — I25118 Atherosclerotic heart disease of native coronary artery with other forms of angina pectoris: Secondary | ICD-10-CM

## 2022-02-05 DIAGNOSIS — Z01812 Encounter for preprocedural laboratory examination: Secondary | ICD-10-CM | POA: Insufficient documentation

## 2022-02-05 DIAGNOSIS — I110 Hypertensive heart disease with heart failure: Secondary | ICD-10-CM | POA: Insufficient documentation

## 2022-02-05 DIAGNOSIS — J984 Other disorders of lung: Secondary | ICD-10-CM | POA: Diagnosis not present

## 2022-02-05 DIAGNOSIS — J9611 Chronic respiratory failure with hypoxia: Secondary | ICD-10-CM | POA: Insufficient documentation

## 2022-02-05 DIAGNOSIS — Z9981 Dependence on supplemental oxygen: Secondary | ICD-10-CM | POA: Insufficient documentation

## 2022-02-05 DIAGNOSIS — Z1152 Encounter for screening for COVID-19: Secondary | ICD-10-CM | POA: Diagnosis not present

## 2022-02-05 DIAGNOSIS — I255 Ischemic cardiomyopathy: Secondary | ICD-10-CM | POA: Insufficient documentation

## 2022-02-05 DIAGNOSIS — E785 Hyperlipidemia, unspecified: Secondary | ICD-10-CM | POA: Diagnosis not present

## 2022-02-05 DIAGNOSIS — J449 Chronic obstructive pulmonary disease, unspecified: Secondary | ICD-10-CM

## 2022-02-05 DIAGNOSIS — I200 Unstable angina: Secondary | ICD-10-CM

## 2022-02-05 DIAGNOSIS — I10 Essential (primary) hypertension: Secondary | ICD-10-CM

## 2022-02-05 LAB — BASIC METABOLIC PANEL
Anion gap: 12 (ref 5–15)
BUN: 15 mg/dL (ref 6–20)
CO2: 25 mmol/L (ref 22–32)
Calcium: 9.1 mg/dL (ref 8.9–10.3)
Chloride: 100 mmol/L (ref 98–111)
Creatinine, Ser: 0.91 mg/dL (ref 0.61–1.24)
GFR, Estimated: >60 mL/min (ref >60)
Glucose, Bld: 120 mg/dL — ABNORMAL HIGH (ref 70–99)
Potassium: 4 mmol/L (ref 3.5–5.1)
Sodium: 137 mmol/L (ref 135–145)

## 2022-02-05 LAB — CBC
HCT: 42.2 % (ref 39.0–52.0)
Hemoglobin: 13.5 g/dL (ref 13.0–17.0)
MCH: 26 pg (ref 26.0–34.0)
MCHC: 32 g/dL (ref 30.0–36.0)
MCV: 81.2 fL (ref 80.0–100.0)
Platelets: 512 10*3/uL — ABNORMAL HIGH (ref 150–400)
RBC: 5.2 MIL/uL (ref 4.22–5.81)
RDW: 17.4 % — ABNORMAL HIGH (ref 11.5–15.5)
WBC: 9.1 10*3/uL (ref 4.0–10.5)
nRBC: 0 % (ref 0.0–0.2)

## 2022-02-05 LAB — PROTIME-INR
INR: 1 (ref 0.8–1.2)
Prothrombin Time: 13.5 seconds (ref 11.4–15.2)

## 2022-02-05 LAB — APTT: aPTT: 32 seconds (ref 24–36)

## 2022-02-05 NOTE — Telephone Encounter (Signed)
Prescription refill request for Eliquis received. Indication:dvt Last office visit:12/23 Scr:0.9 2/24 Age: 62 Weight:49.9  kg  Prescription refilled

## 2022-02-05 NOTE — Telephone Encounter (Signed)
Please review

## 2022-02-06 LAB — SARS CORONAVIRUS 2 (TAT 6-24 HRS): SARS Coronavirus 2: NEGATIVE

## 2022-02-07 ENCOUNTER — Encounter
Admission: RE | Disposition: A | Discharge status: Home / Self Care | Payer: Self-pay | Source: Home / Self Care | Attending: Pulmonary Disease

## 2022-02-07 ENCOUNTER — Ambulatory Visit: Payer: Managed Care, Other (non HMO) | Admitting: Urgent Care

## 2022-02-07 ENCOUNTER — Ambulatory Visit: Payer: Managed Care, Other (non HMO)

## 2022-02-07 ENCOUNTER — Ambulatory Visit
Admission: RE | Admit: 2022-02-07 | Discharge: 2022-02-07 | Disposition: A | Discharge status: Home / Self Care | Payer: Managed Care, Other (non HMO) | Attending: Pulmonary Disease | Admitting: Pulmonary Disease

## 2022-02-07 ENCOUNTER — Other Ambulatory Visit: Payer: Self-pay

## 2022-02-07 DIAGNOSIS — I7121 Aneurysm of the ascending aorta, without rupture: Secondary | ICD-10-CM | POA: Diagnosis not present

## 2022-02-07 DIAGNOSIS — I700 Atherosclerosis of aorta: Secondary | ICD-10-CM | POA: Insufficient documentation

## 2022-02-07 DIAGNOSIS — Z7952 Long term (current) use of systemic steroids: Secondary | ICD-10-CM | POA: Diagnosis not present

## 2022-02-07 DIAGNOSIS — I255 Ischemic cardiomyopathy: Secondary | ICD-10-CM | POA: Insufficient documentation

## 2022-02-07 DIAGNOSIS — C3431 Malignant neoplasm of lower lobe, right bronchus or lung: Secondary | ICD-10-CM | POA: Insufficient documentation

## 2022-02-07 DIAGNOSIS — Z7982 Long term (current) use of aspirin: Secondary | ICD-10-CM | POA: Insufficient documentation

## 2022-02-07 DIAGNOSIS — C771 Secondary and unspecified malignant neoplasm of intrathoracic lymph nodes: Secondary | ICD-10-CM | POA: Insufficient documentation

## 2022-02-07 DIAGNOSIS — R001 Bradycardia, unspecified: Secondary | ICD-10-CM

## 2022-02-07 DIAGNOSIS — I7143 Infrarenal abdominal aortic aneurysm, without rupture: Secondary | ICD-10-CM | POA: Insufficient documentation

## 2022-02-07 DIAGNOSIS — Z8673 Personal history of transient ischemic attack (TIA), and cerebral infarction without residual deficits: Secondary | ICD-10-CM | POA: Diagnosis not present

## 2022-02-07 DIAGNOSIS — J9611 Chronic respiratory failure with hypoxia: Secondary | ICD-10-CM

## 2022-02-07 DIAGNOSIS — E785 Hyperlipidemia, unspecified: Secondary | ICD-10-CM | POA: Diagnosis not present

## 2022-02-07 DIAGNOSIS — J439 Emphysema, unspecified: Secondary | ICD-10-CM | POA: Diagnosis not present

## 2022-02-07 DIAGNOSIS — I25118 Atherosclerotic heart disease of native coronary artery with other forms of angina pectoris: Secondary | ICD-10-CM

## 2022-02-07 DIAGNOSIS — Z955 Presence of coronary angioplasty implant and graft: Secondary | ICD-10-CM | POA: Diagnosis not present

## 2022-02-07 DIAGNOSIS — Z01812 Encounter for preprocedural laboratory examination: Secondary | ICD-10-CM

## 2022-02-07 DIAGNOSIS — I25119 Atherosclerotic heart disease of native coronary artery with unspecified angina pectoris: Secondary | ICD-10-CM | POA: Diagnosis not present

## 2022-02-07 DIAGNOSIS — I252 Old myocardial infarction: Secondary | ICD-10-CM | POA: Diagnosis not present

## 2022-02-07 DIAGNOSIS — Z86711 Personal history of pulmonary embolism: Secondary | ICD-10-CM | POA: Diagnosis not present

## 2022-02-07 DIAGNOSIS — F17210 Nicotine dependence, cigarettes, uncomplicated: Secondary | ICD-10-CM | POA: Insufficient documentation

## 2022-02-07 DIAGNOSIS — G459 Transient cerebral ischemic attack, unspecified: Secondary | ICD-10-CM

## 2022-02-07 DIAGNOSIS — Z9981 Dependence on supplemental oxygen: Secondary | ICD-10-CM | POA: Insufficient documentation

## 2022-02-07 DIAGNOSIS — M353 Polymyalgia rheumatica: Secondary | ICD-10-CM | POA: Insufficient documentation

## 2022-02-07 DIAGNOSIS — J449 Chronic obstructive pulmonary disease, unspecified: Secondary | ICD-10-CM

## 2022-02-07 DIAGNOSIS — J984 Other disorders of lung: Secondary | ICD-10-CM

## 2022-02-07 DIAGNOSIS — J850 Gangrene and necrosis of lung: Secondary | ICD-10-CM | POA: Diagnosis present

## 2022-02-07 DIAGNOSIS — I10 Essential (primary) hypertension: Secondary | ICD-10-CM

## 2022-02-07 DIAGNOSIS — I5022 Chronic systolic (congestive) heart failure: Secondary | ICD-10-CM | POA: Insufficient documentation

## 2022-02-07 DIAGNOSIS — I200 Unstable angina: Secondary | ICD-10-CM

## 2022-02-07 DIAGNOSIS — Z79899 Other long term (current) drug therapy: Secondary | ICD-10-CM | POA: Diagnosis not present

## 2022-02-07 DIAGNOSIS — Z7901 Long term (current) use of anticoagulants: Secondary | ICD-10-CM | POA: Diagnosis not present

## 2022-02-07 DIAGNOSIS — I110 Hypertensive heart disease with heart failure: Secondary | ICD-10-CM | POA: Insufficient documentation

## 2022-02-07 DIAGNOSIS — I716 Thoracoabdominal aortic aneurysm, without rupture, unspecified: Secondary | ICD-10-CM

## 2022-02-07 HISTORY — DX: Infrarenal abdominal aortic aneurysm, without rupture: I71.43

## 2022-02-07 HISTORY — DX: Essential (primary) hypertension: I10

## 2022-02-07 HISTORY — DX: Bradycardia, unspecified: R00.1

## 2022-02-07 HISTORY — DX: Other intervertebral disc degeneration, lumbar region: M51.36

## 2022-02-07 HISTORY — DX: Atherosclerosis of aorta: I70.0

## 2022-02-07 HISTORY — DX: Calculus of kidney: N20.0

## 2022-02-07 HISTORY — DX: Gastro-esophageal reflux disease without esophagitis: K21.9

## 2022-02-07 HISTORY — PX: RIGID BRONCHOSCOPY: SHX5069

## 2022-02-07 HISTORY — DX: Other specified health status: Z78.9

## 2022-02-07 HISTORY — DX: Other intervertebral disc degeneration, lumbar region without mention of lumbar back pain or lower extremity pain: M51.369

## 2022-02-07 HISTORY — DX: Long term (current) use of anticoagulants: Z79.01

## 2022-02-07 HISTORY — DX: Long term (current) use of systemic steroids: Z79.52

## 2022-02-07 SURGERY — BRONCHOSCOPY, RIGID
Anesthesia: General

## 2022-02-07 MED ORDER — DEXMEDETOMIDINE HCL IN NACL 80 MCG/20ML IV SOLN
INTRAVENOUS | Status: DC | PRN
  Administered 2022-02-07 (×3): 4 ug via BUCCAL

## 2022-02-07 MED ORDER — LIDOCAINE HCL (PF) 1 % IJ SOLN
30.0000 mL | Freq: Once | INTRAMUSCULAR | Status: DC
  Filled 2022-02-07: qty 30

## 2022-02-07 MED ORDER — LIDOCAINE HCL URETHRAL/MUCOSAL 2 % EX GEL
1.0000 | Freq: Once | CUTANEOUS | Status: DC
  Filled 2022-02-07: qty 5

## 2022-02-07 MED ORDER — FENTANYL CITRATE (PF) 100 MCG/2ML IJ SOLN
INTRAMUSCULAR | Status: DC | PRN
  Administered 2022-02-07 (×4): 25 ug via INTRAVENOUS

## 2022-02-07 MED ORDER — FENTANYL CITRATE (PF) 100 MCG/2ML IJ SOLN
INTRAMUSCULAR | Status: AC
  Filled 2022-02-07: qty 2

## 2022-02-07 MED ORDER — DEXAMETHASONE SODIUM PHOSPHATE 10 MG/ML IJ SOLN
INTRAMUSCULAR | Status: DC | PRN
  Administered 2022-02-07: 10 mg via INTRAVENOUS

## 2022-02-07 MED ORDER — PROPOFOL 10 MG/ML IV BOLUS
INTRAVENOUS | Status: AC
  Filled 2022-02-07: qty 20

## 2022-02-07 MED ORDER — PHENYLEPHRINE HCL (PRESSORS) 10 MG/ML IV SOLN
INTRAVENOUS | Status: DC | PRN
  Administered 2022-02-07: 80 ug via INTRAVENOUS

## 2022-02-07 MED ORDER — ESMOLOL HCL 100 MG/10ML IV SOLN
INTRAVENOUS | Status: DC | PRN
  Administered 2022-02-07 (×3): 10 mg via INTRAVENOUS

## 2022-02-07 MED ORDER — ORAL CARE MOUTH RINSE: 15.0000 mL | Freq: Once | OROMUCOSAL | Status: AC

## 2022-02-07 MED ORDER — ONDANSETRON HCL 4 MG/2ML IJ SOLN
INTRAMUSCULAR | Status: DC | PRN
  Administered 2022-02-07: 4 mg via INTRAVENOUS

## 2022-02-07 MED ORDER — PHENYLEPHRINE HCL 0.25 % NA SOLN: 1.0000 | Freq: Four times a day (QID) | NASAL | Status: DC | PRN

## 2022-02-07 MED ORDER — CHLORHEXIDINE GLUCONATE 0.12 % MT SOLN
OROMUCOSAL | Status: AC
  Administered 2022-02-07: 15 mL via OROMUCOSAL
  Filled 2022-02-07: qty 15

## 2022-02-07 MED ORDER — ROCURONIUM BROMIDE 100 MG/10ML IV SOLN
INTRAVENOUS | Status: DC | PRN
  Administered 2022-02-07: 10 mg via INTRAVENOUS
  Administered 2022-02-07: 40 mg via INTRAVENOUS

## 2022-02-07 MED ORDER — LACTATED RINGERS IV SOLN: INTRAVENOUS | Status: DC

## 2022-02-07 MED ORDER — PROPOFOL 1000 MG/100ML IV EMUL
INTRAVENOUS | Status: AC
  Filled 2022-02-07: qty 100

## 2022-02-07 MED ORDER — BUTAMBEN-TETRACAINE-BENZOCAINE 2-2-14 % EX AERO: 1.0000 | INHALATION_SPRAY | Freq: Once | CUTANEOUS | Status: DC

## 2022-02-07 MED ORDER — PROPOFOL 10 MG/ML IV BOLUS
INTRAVENOUS | Status: DC | PRN
  Administered 2022-02-07: 100 mg via INTRAVENOUS

## 2022-02-07 MED ORDER — CHLORHEXIDINE GLUCONATE 0.12 % MT SOLN: 15.0000 mL | Freq: Once | OROMUCOSAL | Status: AC

## 2022-02-07 MED ORDER — FENTANYL CITRATE (PF) 100 MCG/2ML IJ SOLN: 25.0000 ug | INTRAMUSCULAR | Status: DC | PRN

## 2022-02-07 MED ORDER — ONDANSETRON HCL 4 MG/2ML IJ SOLN: 4.0000 mg | Freq: Once | INTRAMUSCULAR | Status: DC | PRN

## 2022-02-07 MED ORDER — LIDOCAINE HCL (CARDIAC) PF 100 MG/5ML IV SOSY
PREFILLED_SYRINGE | INTRAVENOUS | Status: DC | PRN
  Administered 2022-02-07: 50 mg via INTRAVENOUS

## 2022-02-07 MED ORDER — SUGAMMADEX SODIUM 200 MG/2ML IV SOLN
INTRAVENOUS | Status: DC | PRN
  Administered 2022-02-07: 200 mg via INTRAVENOUS

## 2022-02-07 NOTE — Discharge Instructions (Signed)
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
## 2022-02-07 NOTE — Anesthesia Procedure Notes (Signed)
Procedure Name: Intubation Date/Time: 02/07/2022 12:49 PM  Performed by: Lerry Liner, CRNAPre-anesthesia Checklist: Patient identified, Emergency Drugs available, Suction available and Patient being monitored Patient Re-evaluated:Patient Re-evaluated prior to induction Oxygen Delivery Method: Circle system utilized Preoxygenation: Pre-oxygenation with 100% oxygen Induction Type: IV induction Ventilation: Mask ventilation without difficulty Laryngoscope Size: McGraph and 4 Grade View: Grade I Tube type: Oral Tube size: 8.5 mm Number of attempts: 1 Airway Equipment and Method: Stylet and Oral airway Placement Confirmation: ETT inserted through vocal cords under direct vision, positive ETCO2 and breath sounds checked- equal and bilateral Secured at: 24 cm Tube secured with: Tape Dental Injury: Teeth and Oropharynx as per pre-operative assessment

## 2022-02-07 NOTE — Anesthesia Postprocedure Evaluation (Signed)
Anesthesia Post Note  Patient: Clinton Walsh  Procedure(s) Performed: RIGID BRONCHOSCOPY  Patient location during evaluation: PACU Anesthesia Type: General Level of consciousness: sedated Pain management: satisfactory to patient Vital Signs Assessment: post-procedure vital signs reviewed and stable Respiratory status: spontaneous breathing Anesthetic complications: no  No notable events documented.   Last Vitals:  Vitals:   02/07/22 1435 02/07/22 1440  BP: 92/81 (!) 76/59  Pulse: 82 87  Resp: 15 12  Temp:    SpO2: 96% 99%    Last Pain:  Vitals:   02/07/22 1428  TempSrc:   PainSc: 0-No pain                 [name],Clinton Sampedro
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
## 2022-02-07 NOTE — Transfer of Care (Signed)
Immediate Anesthesia Transfer of Care Note  Patient: Clinton Walsh  Procedure(s) Performed: RIGID BRONCHOSCOPY  Patient Location: PACU  Anesthesia Type:General  Level of Consciousness: drowsy  Airway & Oxygen Therapy: Patient Spontanous Breathing and Patient connected to face mask oxygen  Post-op Assessment: Report given to RN  Post vital signs: stable  Last Vitals:  Vitals Value Taken Time  BP 105/80 02/07/22 1415  Temp 37.7 C 02/07/22 1411  Pulse 79 02/07/22 1416  Resp 14 02/07/22 1416  SpO2 89 % 02/07/22 1416  Vitals shown include unvalidated device data.  Last Pain:  Vitals:   02/07/22 1150  TempSrc: Tympanic  PainSc: 5          Complications: No notable events documented.

## 2022-02-07 NOTE — Procedures (Signed)
FIBEROPTIC BRONCHOSCOPY WITH THERAPEUTIC ASPIRATION OF TRACHEOBRONCHIAL TREE AND BRONCHOALVEOLAR LAVAGE PROCEDURE NOTE  ENDOBRONCHIAL ULTRASOUND PROCEDURE NOTE    Flexible bronchoscopy was performed  by : Lanney Gins MD  assistance by : 1)Repiratory therapist  and 2)[employer] staff and 3) Anesthesia team   Indication for the procedure was :  Pre-procedural H&P. The following assessment was performed on the day of the procedure prior to initiating sedation History:  Chest pain n Dyspnea y Hemoptysis n Cough y Fever n Other pertinent items n  Examination Vital signs -reviewed as per nursing documentation today Cardiac    Murmurs: n  Rubs : n  Gallop: n Lungs Wheezing: n Rales : n Rhonchi :y  Other pertinent findings: SOB/hypoxemia due to chronic lung disease   Pre-procedural assessment for Procedural Sedation included: Depth of sedation: As per anesthesia team  ASA Classification:  2 Mallampati airway assessment: 3    Medication list reviewed: y  The patient's interval history was taken and revealed: no new complaints The pre- procedure physical examination revealed: No new findings Refer to prior clinic note for details.  Informed Consent: Informed consent was obtained from:  patient after explanation of procedure and risks, benefits, as well as alternative procedures available.  Explanation of level of sedation and possible transfusion was also provided.    Procedural Preparation: Time out was performed and patient was identified by name and birthdate and procedure to be performed and side for sampling, if any, was specified. Pt was intubated by anesthesia.  The patient was appropriately draped.   Fiberoptic bronchoscopy with airway inspection and BAL Procedure findings:  Bronchoscope was inserted via ETT  without difficulty.  Posterior oropharynx, epiglottis, arytenoids, false cords and vocal cords were not visualized as these were bypassed by  endotracheal tube. The distal trachea was normal in circumference and appearance without mucosal, cartilaginous or branching abnormalities.  The main carina was mildly splayed . All right and left lobar airways were visualized to the Subsegmental level.  Sub- sub segmental carinae were identified in all the distal airways.   Secretions were visible in the following airways and appeared to be clear.  The mucosa was : friable at RUL AND LL  Airways were notable for:        exophytic lesions :n       extrinsic compression in the following distributions: right lower lobe       Friable mucosa: y       Neurosurgeon /pigmentation: n     Post procedure Diagnosis:   severe mucus plugging removed via multiple aspirations.  Posterior segment of right lower lobe extrinsically compressed.    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 Examination began to evaluate for pathologically enlarged lymph nodes starting on the LEFT  side progressing to the RIGHT side.  All lymph node biopsies performed with 21G needle. Lymph node biopsies were sent in cytolite for all stations.  STATION 10L - 6MM NOT BIOPSIED STATION RL 5MM NOT BIOPSIED STATION 7 -2.1CM BIOPSIED 4 TIMES STATION 10R/11R/TUMOR INVASTION - BIOPSIED 5 TIMES - LESIONAL CARCINOMA   Post procedure diagnosis:  LUNG CANCER   Specimens obtained included:                    Cytology brushes :  AT RIGHT LOWER LOBE CARCINOMA NOTED BY MICROSCOPY  Broncho-alveolar lavage site:RIGHT LOWER LOBE   sent for FUNGAL, AFB , GRAM STAIN AND CULTURE, ASPERGILLUS,                              80 ml volume infused 40 ml volume returned with MUCOPURULENT appearance   Fluoroscopy Used: NO;          Immediate sampling complications included:NONE  Epinephrine NONE ml was used topically  The bronchoscopy was terminated due to completion of the planned procedure and the bronchoscope was removed.   Total dosage of Lidocaine was NONEmg Total fluoroscopy time was NONE minutes  Supplemental oxygen was provided at PER ANESTHESIA lpm by nasal canula post operatively  Estimated Blood loss: <10CC EXPECTED.  Complications included:  NONE IMMEDIATE  Preliminary CXR findings :  IN PROCESS  Disposition: HOME WITH WIFE  Follow up with Dr. Lanney Gins in 5 days for result discussion.     Ottie Glazier MD  [facility]

## 2022-02-08 ENCOUNTER — Encounter: Payer: Self-pay | Admitting: Pulmonary Disease

## 2022-02-09 ENCOUNTER — Other Ambulatory Visit: Payer: Self-pay | Admitting: Anatomic Pathology & Clinical Pathology

## 2022-02-09 LAB — SURGICAL PATHOLOGY

## 2022-02-09 LAB — CYTOLOGY - NON PAP

## 2022-02-09 LAB — ACID FAST SMEAR (AFB, MYCOBACTERIA): Acid Fast Smear: POSITIVE — AB

## 2022-02-11 LAB — CULTURE, BAL-QUANTITATIVE W GRAM STAIN: Culture: <10000 — AB

## 2022-02-12 LAB — ASPERGILLUS ANTIGEN, BAL/SERUM: Aspergillus Ag, BAL/Serum: 0.07 Index (ref 0.00–0.49)

## 2022-02-14 ENCOUNTER — Ambulatory Visit: Payer: Managed Care, Other (non HMO) | Admitting: Internal Medicine

## 2022-02-14 ENCOUNTER — Other Ambulatory Visit: Payer: Managed Care, Other (non HMO)

## 2022-02-15 ENCOUNTER — Telehealth: Payer: Self-pay | Admitting: *Deleted

## 2022-02-15 ENCOUNTER — Other Ambulatory Visit: Payer: Managed Care, Other (non HMO)

## 2022-02-15 DIAGNOSIS — C3411 Malignant neoplasm of upper lobe, right bronchus or lung: Secondary | ICD-10-CM | POA: Insufficient documentation

## 2022-02-15 NOTE — Assessment & Plan Note (Addendum)
#   FEB 2024- LUNG, RIGHT LOWER LOBE, POSTERIOR SEGMENT; BRONCHOALVEOLAR LAVAGE: - POSITIVE FOR MALIGNANCY.; NON-SMALL CELL CARCINOMA, FAVOR ADENOCARCINOMA Right upper lobe lung cancer-[Dr.Aleskerov- FEB 2024]-non-small cell favor adenocarcinoma. CT scan- DEC 2024-  Advanced changes of necrotizing pneumonia within the right upper lobe and right lower lobe with progressive, extensive areas of cavitation within the affected regions, particularly the right upper lobe. No central obstructing lesion.  Progressive endobronchial spread of infection within the right upper lobe, right middle lobe, and right lower lobe.  14 mm nodular opacity within the subpleural left lower lobe, likely infectious or inflammatory.   # Reviewed the pathology patient and family in detail.  Patient is not a surgical candidate.  Recommend a PET scan for evaluation.  Based on PET scan results-if locally advanced would recommend chemoradiation followed by adjuvant immunotherapy.  However patient has metastatic disease-chemoimmunotherapy will be recommended.  Consider brain MRI.    # However in the context of his multiple medical problems including advanced COPD/ischemic cardiomyopathy ejection fraction of 35 to 40%/significant weight loss/debility-patient at extremely high risk of complications from chemotherapy/immunotherapy [?Hx PMR].  This was discussed extensively with the patient and his wife.  Again await PET scan for further recommendations/discussion.   # History of CAD- left ventricular ejection fraction, by estimation, is 35 to 40%.  Currently clinically stable.  # COPD: Active smoker.  On home O2 4 to 6 L.  # PAIN CONTROL: Unclear if this is malignancy related versus chronic back pain/arthralgias.  Await PET scan above.  Order hydrocodone 1 pill every 12 hours as needed.  Recommend evaluation with Josh Borders/palliative care for symptom management goals of care discussion at next visit.  Will refer to Dr. Donella Stade for possible  radiation for pain control.  Again await PET scan.  #Incidental findings on Imaging  CT DEC , 2024: Suggestive of pulm hypertension severe emphysema, atherosclerosis bilateral nonobstructing atherosclerosis partially evaluated AAA of 4 cm; thoracic aneurysm up to 4.3 cm stable-I reviewed/discussed/counseled the patient.   # DISPOSITION: # no labs-  # PET ASAP # Follow up 2-3 days after the PET scan- # Palliative care referral re: Symptom management with next visit-  Dr.B  Thank you for allowing me to participate in the care of your pleasant patient. Please do not hesitate to contact me with questions or concerns in the interim.  # I reviewed the blood work- with the patient in detail; also reviewed the imaging independently [as summarized above]; and with the patient in detail.   Above plan of care was discussed with patient/family in detail.  My contact information was given to the patient/family.

## 2022-02-15 NOTE — Telephone Encounter (Signed)
Attempt made to connect with patient by phone prior to his appointment tomorrow as a new patient at the [facility]. No answer.

## 2022-02-16 ENCOUNTER — Inpatient Hospital Stay: Payer: Managed Care, Other (non HMO) | Attending: Internal Medicine | Admitting: Internal Medicine

## 2022-02-16 ENCOUNTER — Encounter: Payer: Self-pay | Admitting: *Deleted

## 2022-02-16 ENCOUNTER — Inpatient Hospital Stay: Payer: Managed Care, Other (non HMO)

## 2022-02-16 ENCOUNTER — Encounter: Payer: Self-pay | Admitting: Internal Medicine

## 2022-02-16 VITALS — BP 117/89 | HR 110 | Temp 97.7°F | Resp 22 | Wt 101.4 lb

## 2022-02-16 DIAGNOSIS — C3411 Malignant neoplasm of upper lobe, right bronchus or lung: Secondary | ICD-10-CM | POA: Insufficient documentation

## 2022-02-16 DIAGNOSIS — E785 Hyperlipidemia, unspecified: Secondary | ICD-10-CM | POA: Insufficient documentation

## 2022-02-16 DIAGNOSIS — Z79899 Other long term (current) drug therapy: Secondary | ICD-10-CM

## 2022-02-16 DIAGNOSIS — Z7901 Long term (current) use of anticoagulants: Secondary | ICD-10-CM | POA: Diagnosis not present

## 2022-02-16 DIAGNOSIS — K219 Gastro-esophageal reflux disease without esophagitis: Secondary | ICD-10-CM | POA: Diagnosis not present

## 2022-02-16 DIAGNOSIS — I252 Old myocardial infarction: Secondary | ICD-10-CM | POA: Diagnosis not present

## 2022-02-16 DIAGNOSIS — I110 Hypertensive heart disease with heart failure: Secondary | ICD-10-CM | POA: Insufficient documentation

## 2022-02-16 DIAGNOSIS — F17210 Nicotine dependence, cigarettes, uncomplicated: Secondary | ICD-10-CM

## 2022-02-16 DIAGNOSIS — Z86711 Personal history of pulmonary embolism: Secondary | ICD-10-CM | POA: Diagnosis not present

## 2022-02-16 DIAGNOSIS — C3490 Malignant neoplasm of unspecified part of unspecified bronchus or lung: Secondary | ICD-10-CM

## 2022-02-16 MED ORDER — HYDROCODONE-ACETAMINOPHEN 5-325 MG PO TABS: 1.0000 | ORAL_TABLET | Freq: Two times a day (BID) | ORAL | 0 refills | Status: DC | PRN
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
## 2022-02-16 NOTE — Progress Notes (Signed)
Met with patient and his wife during initial consult with Dr. Rogue Bussing to review recent biopsy results and treatment options. All questions answered during visit. Reviewed upcoming appts for PET scan and follow up. Informed that will be working on getting PET scan scheduled sooner once approved through insurance. Contact info given and instructed to call with any questions or needs. Pt and his wife verbalized understanding. Nothing further needed at this time.

## 2022-02-16 NOTE — Progress Notes (Signed)
Pt has been dealing with Lung problems for the past year. A biopsy was done in March 23 which was negative. Pt was on antibiotics for pneumonia for months, he lost 35 lbs because his stomach was messed up from the antibiotics. His appetite is diminishing. Zero energy. Legs feel so weak. Has oxygen at home. Pt also has a foley catheter x 2 years now. Has a sticking type pain in his back at the old biopsy site. He has constant pain in R thorax and low back. 7/10 on pain scale.

## 2022-02-27 ENCOUNTER — Encounter
Admission: RE | Admit: 2022-02-27 | Discharge: 2022-02-27 | Disposition: A | Discharge status: Home / Self Care | Payer: Managed Care, Other (non HMO) | Source: Ambulatory Visit | Attending: Internal Medicine | Admitting: Internal Medicine

## 2022-02-27 DIAGNOSIS — C3490 Malignant neoplasm of unspecified part of unspecified bronchus or lung: Secondary | ICD-10-CM | POA: Diagnosis present

## 2022-02-27 LAB — GLUCOSE, CAPILLARY: Glucose-Capillary: 130 mg/dL — ABNORMAL HIGH (ref 70–99)

## 2022-02-27 MED ORDER — FLUDEOXYGLUCOSE F - 18 (FDG) INJECTION
5.3400 | Freq: Once | INTRAVENOUS | Status: AC | PRN
  Administered 2022-02-27: 5.34 via INTRAVENOUS

## 2022-03-01 ENCOUNTER — Encounter: Payer: Self-pay | Admitting: *Deleted

## 2022-03-01 ENCOUNTER — Inpatient Hospital Stay: Payer: Managed Care, Other (non HMO) | Attending: Internal Medicine | Admitting: Internal Medicine

## 2022-03-01 ENCOUNTER — Inpatient Hospital Stay (HOSPITAL_BASED_OUTPATIENT_CLINIC_OR_DEPARTMENT_OTHER): Payer: Managed Care, Other (non HMO) | Admitting: Hospice and Palliative Medicine

## 2022-03-01 ENCOUNTER — Encounter: Payer: Self-pay | Admitting: Internal Medicine

## 2022-03-01 VITALS — BP 100/66 | HR 104 | Temp 98.0°F | Resp 20 | Wt 101.3 lb

## 2022-03-01 DIAGNOSIS — Z7901 Long term (current) use of anticoagulants: Secondary | ICD-10-CM | POA: Diagnosis not present

## 2022-03-01 DIAGNOSIS — F17210 Nicotine dependence, cigarettes, uncomplicated: Secondary | ICD-10-CM | POA: Diagnosis not present

## 2022-03-01 DIAGNOSIS — C3411 Malignant neoplasm of upper lobe, right bronchus or lung: Secondary | ICD-10-CM

## 2022-03-01 DIAGNOSIS — I2510 Atherosclerotic heart disease of native coronary artery without angina pectoris: Secondary | ICD-10-CM | POA: Diagnosis not present

## 2022-03-01 DIAGNOSIS — J449 Chronic obstructive pulmonary disease, unspecified: Secondary | ICD-10-CM | POA: Diagnosis not present

## 2022-03-01 DIAGNOSIS — Z79899 Other long term (current) drug therapy: Secondary | ICD-10-CM | POA: Insufficient documentation

## 2022-03-01 DIAGNOSIS — Z7982 Long term (current) use of aspirin: Secondary | ICD-10-CM | POA: Insufficient documentation

## 2022-03-01 DIAGNOSIS — M549 Dorsalgia, unspecified: Secondary | ICD-10-CM | POA: Insufficient documentation

## 2022-03-01 DIAGNOSIS — Z86711 Personal history of pulmonary embolism: Secondary | ICD-10-CM | POA: Insufficient documentation

## 2022-03-01 DIAGNOSIS — Z515 Encounter for palliative care: Secondary | ICD-10-CM

## 2022-03-01 DIAGNOSIS — G893 Neoplasm related pain (acute) (chronic): Secondary | ICD-10-CM

## 2022-03-01 MED ORDER — HYDROCODONE-ACETAMINOPHEN 10-325 MG PO TABS: 1.0000 | ORAL_TABLET | ORAL | 0 refills | Status: DC | PRN

## 2022-03-01 MED ORDER — MORPHINE SULFATE ER 15 MG PO TBCR: 15.0000 mg | EXTENDED_RELEASE_TABLET | Freq: Two times a day (BID) | ORAL | 0 refills | Status: DC

## 2022-03-01 NOTE — Assessment & Plan Note (Addendum)
#   FEB 2024- LUNG, RIGHT LOWER LOBE, POSTERIOR SEGMENT; BRONCHOALVEOLAR LAVAGE: - POSITIVE FOR MALIGNANCY.; NON-SMALL CELL CARCINOMA, FAVOR ADENOCARCINOMA Right upper lobe lung cancer-[Dr.Aleskerov- FEB 2024]-non-small cell favor adenocarcinoma. FEB 9458- Hypermetabolic consolidative mass in the RIGHT lower lobe and hypermetabolic cavitary process in the RIGHT upper lobe is concerning for bronchogenic carcinoma recurrence; Multi focal hypermetabolic nodules in the LEFT lower lobe favored metastatic disease rather than pulmonary infection.  Several discrete hypermetabolic pulmonary nodules in the LEFT upper lobe consistent with metastasis.  # I reviewed the staging- IV; incurable cancer.  Patient is not a candidate for any systemic therapy-chemotherapy immunotherapy multiple co-morbidities[ advanced COPD/ischemic cardiomyopathy ejection fraction of 35 to 40%/significant weight loss/debility]. I discussed with patient and his wife that he is at extremely high risk of complications from chemotherapy/immunotherapy [?Hx PMR].   # History of CAD- left ventricular ejection fraction, by estimation, is 35 to 40%.  Currently clinically stable.  # COPD: Active smoker.  On home O2 4 to 6 L.  # Given the incurable nature of the disease /poor tolerance of therapy and in general less than 6 months of life expectancy-I introduced hospice philosophy to the patient and family.  Discussed that goal of care should be directed to symptom management rather than treating the underlying disease; and in the process help improve quality of life rather than quantity.  Discussed with hospice team would include-nurse, nurse aide, social worker and chaplain for help take care of patient with physical/emotional needs.  Recommend hospice.  Discussed with [employer].  # DISPOSITION: # follow up as needed- Dr.B  # 40 minutes face-to-face with the patient discussing the above plan of care; more than 50% of time spent on prognosis/  natural history; counseling and coordination.  # I reviewed the blood work- with the patient in detail; also reviewed the imaging independently [as summarized above]; and with the patient in detail.
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
## 2022-03-01 NOTE — Progress Notes (Signed)
Right side chest pain getting worse, 9/10 pain scale today.  Taking Hydrocodone 1 QAM, 1 at lunch, for past couple days taking 3rd dose at 6 pm.  Difficulty sleeping at night with no relief from natural essential oil.  Difficulty swallowing due to "throat doesn't feel right when swallowing".

## 2022-03-02 ENCOUNTER — Ambulatory Visit: Payer: Managed Care, Other (non HMO)

## 2022-03-02 LAB — MAC SUSCEPTIBILITY BROTH
Amikacin: 8
Ciprofloxacin: 8
Clarithromycin: 2
Doxycycline: >8
Linezolid: 32
Minocycline: >8
Rifabutin: 1
Rifampin: >4
Streptomycin: >32

## 2022-03-02 LAB — ACID FAST ID BY PCR AND SUSCEPTIBILITIES
M Tuberculosis Complex: NEGATIVE
M avium complex: POSITIVE — AB

## 2022-03-02 LAB — ACID FAST CULTURE WITH REFLEXED SENSITIVITIES (MYCOBACTERIA): Acid Fast Culture: POSITIVE — AB

## 2022-03-07 ENCOUNTER — Ambulatory Visit: Payer: Managed Care, Other (non HMO) | Admitting: Internal Medicine

## 2022-03-07 ENCOUNTER — Encounter: Payer: Managed Care, Other (non HMO) | Admitting: Hospice and Palliative Medicine

## 2022-03-09 LAB — FUNGUS CULTURE WITH STAIN

## 2022-03-09 LAB — FUNGUS CULTURE RESULT

## 2022-03-09 LAB — FUNGAL ORGANISM REFLEX

## 2022-03-16 ENCOUNTER — Telehealth: Payer: Self-pay | Admitting: Hospice and Palliative Medicine

## 2022-03-16 ENCOUNTER — Other Ambulatory Visit: Payer: Self-pay | Admitting: Hospice and Palliative Medicine

## 2022-03-16 MED ORDER — OXYCODONE HCL 10 MG PO TABS: 5.0000 mg | ORAL_TABLET | ORAL | 0 refills | Status: DC

## 2022-03-16 NOTE — Telephone Encounter (Signed)
Spoke with hospice nurse, Caryl Asp.  Reportedly, patient's pain is poorly controlled.  Pain is averaging 7-10 out of 10.  He finds little benefit from either Norco or MS Contin.  However, patient reports that he is only taking MS Contin once daily.  Encouraged nurse to explain the differences between short acting and long-acting opioids to patient.  I would encourage him to utilize the MS Contin every 12 hours as directed.  Will rotate from [place] to oxycodone.

## 2022-03-20 ENCOUNTER — Other Ambulatory Visit: Payer: Self-pay | Admitting: Hospice and Palliative Medicine

## 2022-03-20 MED ORDER — MORPHINE SULFATE ER 15 MG PO TBCR: 15.0000 mg | EXTENDED_RELEASE_TABLET | Freq: Two times a day (BID) | ORAL | 0 refills | Status: DC

## 2022-03-20 NOTE — Progress Notes (Signed)
Hospice RN, Ivin Booty, requested refill of morphine.  Rx sent to pharmacy.

## 2022-03-22 ENCOUNTER — Telehealth: Payer: Self-pay | Admitting: Hospice and Palliative Medicine

## 2022-03-22 NOTE — Telephone Encounter (Signed)
I received a call from patient's hospice nurse, Joy. Reportedly, patient is frustrated that he was told he is not a treatment candidate and is now under hospice care. He has been reading about possible treatment options and would like to again speak with Dr. Rogue Bussing to discuss. Discussed with Dr. B and scheduling will reach out to coordinate.

## 2022-03-29 ENCOUNTER — Telehealth: Payer: Self-pay | Admitting: Hospice and Palliative Medicine

## 2022-03-29 NOTE — Telephone Encounter (Signed)
Received a call from hospice nurse, Ralls.  Patient has new cough, with worse shortness of breath over the past couple of days, and low-grade fevers.  Family is interested in trial of an antibiotic.  No urinary or GI symptoms.  Okay to start Augmentin 875 twice daily x 10 days.

## 2022-04-03 ENCOUNTER — Inpatient Hospital Stay: Payer: Managed Care, Other (non HMO) | Admitting: Internal Medicine

## 2022-04-04 ENCOUNTER — Ambulatory Visit: Payer: Managed Care, Other (non HMO) | Admitting: Internal Medicine

## 2022-04-06 ENCOUNTER — Other Ambulatory Visit: Payer: Self-pay | Admitting: *Deleted

## 2022-04-06 MED ORDER — OXYCODONE HCL 10 MG PO TABS: 5.0000 mg | ORAL_TABLET | ORAL | 0 refills | Status: DC

## 2022-04-06 NOTE — Telephone Encounter (Signed)
Dr. B patient  

## 2022-04-20 ENCOUNTER — Other Ambulatory Visit: Payer: Self-pay | Admitting: *Deleted

## 2022-04-20 MED ORDER — OXYCODONE HCL 10 MG PO TABS: 5.0000 mg | ORAL_TABLET | ORAL | 0 refills | Status: DC

## 2022-04-20 MED ORDER — MORPHINE SULFATE ER 15 MG PO TBCR: 15.0000 mg | EXTENDED_RELEASE_TABLET | Freq: Two times a day (BID) | ORAL | 0 refills | Status: DC

## 2022-05-07 ENCOUNTER — Other Ambulatory Visit: Payer: Self-pay | Admitting: Hospice and Palliative Medicine

## 2022-05-07 MED ORDER — MORPHINE SULFATE ER 15 MG PO TBCR: 15.0000 mg | EXTENDED_RELEASE_TABLET | Freq: Two times a day (BID) | ORAL | 0 refills | Status: DC

## 2022-05-07 NOTE — Progress Notes (Signed)
Hospice nurse, Joy, requested refill of MS Contin. Rx sent to pharmacy.

## 2022-05-11 ENCOUNTER — Other Ambulatory Visit: Payer: Self-pay | Admitting: Pulmonary Disease

## 2022-05-11 DIAGNOSIS — J441 Chronic obstructive pulmonary disease with (acute) exacerbation: Secondary | ICD-10-CM

## 2022-05-16 ENCOUNTER — Other Ambulatory Visit: Payer: Self-pay | Admitting: Hospice and Palliative Medicine

## 2022-05-16 ENCOUNTER — Other Ambulatory Visit: Payer: Self-pay | Admitting: Internal Medicine

## 2022-05-16 MED ORDER — OXYCODONE HCL 10 MG PO TABS: 5.0000 mg | ORAL_TABLET | ORAL | 0 refills | Status: DC

## 2022-05-16 NOTE — Progress Notes (Signed)
Hospice requested refill of oxycodone.  

## 2022-05-24 ENCOUNTER — Telehealth: Payer: Self-pay | Admitting: Internal Medicine

## 2022-05-24 NOTE — Telephone Encounter (Signed)
Wife stated patient is in hospice and unable to keep his 5/24 appointment.  Wife wants to know if patient can have virtual visit.

## 2022-05-25 ENCOUNTER — Ambulatory Visit: Payer: Managed Care, Other (non HMO) | Admitting: Internal Medicine

## 2022-05-26 ENCOUNTER — Other Ambulatory Visit: Payer: Self-pay | Admitting: Pulmonary Disease

## 2022-05-30 ENCOUNTER — Other Ambulatory Visit: Payer: Self-pay | Admitting: Hospice and Palliative Medicine

## 2022-05-30 MED ORDER — OXYCODONE HCL 10 MG PO TABS: 5.0000 mg | ORAL_TABLET | ORAL | 0 refills | Status: DC

## 2022-05-30 MED ORDER — MORPHINE SULFATE ER 15 MG PO TBCR: 15.0000 mg | EXTENDED_RELEASE_TABLET | Freq: Three times a day (TID) | ORAL | 0 refills | Status: DC

## 2022-05-30 NOTE — Progress Notes (Signed)
I received a call from patient's hospice nurse, Joy.  Patient has had worse generalized pain and is taking oxycodone 10 mg every 4 hours around-the-clock.  Will increase his MS Contin to 15 mg every 8 hours.  Refills were also requested for oxycodone and MS Contin.

## 2022-06-14 ENCOUNTER — Other Ambulatory Visit: Payer: Self-pay | Admitting: Hospice and Palliative Medicine

## 2022-06-14 MED ORDER — DEXAMETHASONE 2 MG PO TABS: 2.0000 mg | ORAL_TABLET | Freq: Every day | ORAL | 1 refills | Status: DC

## 2022-06-14 MED ORDER — MORPHINE SULFATE ER 30 MG PO TBCR: 30.0000 mg | EXTENDED_RELEASE_TABLET | Freq: Two times a day (BID) | ORAL | 0 refills | Status: DC

## 2022-06-14 MED ORDER — OXYCODONE HCL 10 MG PO TABS: 10.0000 mg | ORAL_TABLET | ORAL | 0 refills | Status: DC

## 2022-06-14 NOTE — Progress Notes (Signed)
Received a call from patient's hospice nurse.  Reportedly, patient has had poorly controlled mid back pain.  No falls or trauma reported.  Patient is mostly in bed.  Appetite is poor.  Overall, patient is declining.  Patient has been taking MS Contin 15 mg every 8 hours and is taking oxycodone grams every 3-4 hours without significant relief in pain.  Will increase MS Contin to 30 mg every 12 hours and can further consider increasing frequency of every 8 hours if needed.  Will also start patient on dexamethasone for 1 week to see if that improves inflammatory pain.

## 2022-06-14 NOTE — Addendum Note (Signed)
Addended by: Malachy Moan on: 06/14/2022 01:43 PM   Modules accepted: Orders

## 2022-06-21 ENCOUNTER — Other Ambulatory Visit: Payer: Self-pay | Admitting: Internal Medicine

## 2022-06-21 NOTE — Telephone Encounter (Signed)
Left voicemail to schedule follow up appt

## 2022-06-21 NOTE — Telephone Encounter (Signed)
Please schedule overdue F/U appt for 90 day refills. Thank you! 

## 2022-06-27 ENCOUNTER — Other Ambulatory Visit: Payer: Self-pay | Admitting: Hospice and Palliative Medicine

## 2022-06-27 MED ORDER — OXYCODONE HCL 10 MG PO TABS: 10.0000 mg | ORAL_TABLET | ORAL | 0 refills | Status: DC

## 2022-06-27 NOTE — Progress Notes (Signed)
Hospice nurse, Joy, requested refill of oxycodone. Rx sent to pharmacy.

## 2022-07-12 ENCOUNTER — Other Ambulatory Visit: Payer: Self-pay | Admitting: Hospice and Palliative Medicine

## 2022-07-12 MED ORDER — OXYCODONE HCL 10 MG PO TABS: 10.0000 mg | ORAL_TABLET | ORAL | 0 refills | Status: DC

## 2022-07-12 MED ORDER — MORPHINE SULFATE ER 30 MG PO TBCR: 30.0000 mg | EXTENDED_RELEASE_TABLET | Freq: Two times a day (BID) | ORAL | 0 refills | Status: DC

## 2022-07-12 MED ORDER — LORAZEPAM 0.5 MG PO TABS: 0.5000 mg | ORAL_TABLET | ORAL | 0 refills | Status: DC | PRN

## 2022-07-12 NOTE — Progress Notes (Signed)
Hospice nurse, Juliette Alcide, requested refill of MS Contin, oxycodone, and lorazepam. Rx sent to pharmacy.

## 2022-07-14 ENCOUNTER — Other Ambulatory Visit: Payer: Self-pay

## 2022-07-14 ENCOUNTER — Encounter: Payer: Self-pay | Admitting: Emergency Medicine

## 2022-07-14 DIAGNOSIS — T83511A Infection and inflammatory reaction due to indwelling urethral catheter, initial encounter: Secondary | ICD-10-CM | POA: Insufficient documentation

## 2022-07-14 DIAGNOSIS — Y828 Other medical devices associated with adverse incidents: Secondary | ICD-10-CM | POA: Insufficient documentation

## 2022-07-14 DIAGNOSIS — R339 Retention of urine, unspecified: Secondary | ICD-10-CM | POA: Diagnosis present

## 2022-07-14 NOTE — ED Triage Notes (Signed)
Patient BIB [facility] c/o possible urinary retention.  Patient is hospice patient with chronic foley in place.  Catheter was replaced twice today and flushed by hospice nurse and had output.  Patient's catheter is actively draining in triage but patient states he "feels like he's about to bust."

## 2022-07-14 NOTE — ED Notes (Signed)
Patient's catheter flushed, urine return noted.

## 2022-07-15 ENCOUNTER — Emergency Department
Admission: EM | Admit: 2022-07-15 | Discharge: 2022-07-15 | Disposition: A | Discharge status: Home / Self Care | Payer: Managed Care, Other (non HMO) | Attending: Emergency Medicine | Admitting: Emergency Medicine

## 2022-07-15 DIAGNOSIS — T839XXA Unspecified complication of genitourinary prosthetic device, implant and graft, initial encounter: Secondary | ICD-10-CM

## 2022-07-15 DIAGNOSIS — N390 Urinary tract infection, site not specified: Secondary | ICD-10-CM

## 2022-07-15 LAB — URINALYSIS, ROUTINE W REFLEX MICROSCOPIC
Bilirubin Urine: NEGATIVE
Glucose, UA: NEGATIVE mg/dL
Ketones, ur: NEGATIVE mg/dL
Nitrite: POSITIVE — AB
Protein, ur: 30 mg/dL — AB
RBC / HPF: >50 RBC/hpf (ref 0–5)
Specific Gravity, Urine: 1.015 (ref 1.005–1.030)
WBC, UA: >50 WBC/hpf (ref 0–5)
pH: 6 (ref 5.0–8.0)

## 2022-07-15 MED ORDER — CEPHALEXIN 500 MG PO CAPS: 500.0000 mg | ORAL_CAPSULE | Freq: Four times a day (QID) | ORAL | 0 refills | Status: AC

## 2022-07-15 NOTE — ED Notes (Signed)
Discharge instructions provided by edp were discussed with pt and s/o at bedside. Pt verbalized understanding with no additional questions at this time. Pt wheelchair and assisted into car.

## 2022-07-15 NOTE — Discharge Instructions (Signed)
Please seek medical attention for any high fevers, chest pain, shortness of breath, change in behavior, persistent vomiting, bloody stool or any other new or concerning symptoms.  

## 2022-07-15 NOTE — ED Notes (Signed)
Pt brought back from main lobby. Family at bedside. Pt verbalizes complete relief of s/s. Denies pain. Presents to room with 600 mL urine output in catheter bag. VS updated. Pt on VS monitor. Call bell within reach. Denies any additional needs

## 2022-07-15 NOTE — ED Provider Notes (Signed)
[facility] Provider Note    Event Date/Time   First MD Initiated Contact with Patient 07/15/22 0207     (approximate)   History   Urinary Retention   HPI  Clinton Walsh is a 62 y.o. male who presents to the emergency department today because of concerns for Foley catheter obstruction.  The patient states that he had to have his Foley catheter changed twice today because of obstructions.  However by the time my exam it did start flowing again.  He says he feels better.  He had been having some lower abdominal discomfort.  Patient denies any fevers.  Denies any recent nausea or vomiting.     Physical Exam   Triage Vital Signs: ED Triage Vitals  Encounter Vitals Group     BP 07/14/22 2303 117/82     Systolic BP Percentile --      Diastolic BP Percentile --      Pulse Rate 07/14/22 2303 (!) 101     Resp 07/14/22 2303 18     Temp 07/14/22 2303 97.7 F (36.5 C)     Temp Source 07/14/22 2303 Oral     SpO2 07/14/22 2303 92 %     Weight 07/14/22 2304 105 lb 13.1 oz (48 kg)     Height 07/14/22 2304 5\' 5"  (1.651 m)     Head Circumference --      Peak Flow --      Pain Score 07/14/22 2304 10     Pain Loc --      Pain Education --      Exclude from Growth Chart --     Most recent vital signs: Vitals:   07/15/22 0237 07/15/22 0300  BP:  100/75  Pulse: 64 69  Resp:  17  Temp:    SpO2: 100% 100%   General: Awake, alert, oriented. CV:  Good peripheral perfusion. Regular rate and rhythm. Resp:  Normal effort. Lungs clear. Abd:  No distention. Non tender.   ED Results / Procedures / Treatments   Labs (all labs ordered are listed, but only abnormal results are displayed) Labs Reviewed  URINALYSIS, ROUTINE W REFLEX MICROSCOPIC - Abnormal; Notable for the following components:      Result Value   Color, Urine YELLOW (*)    APPearance CLOUDY (*)    Hgb urine dipstick MODERATE (*)    Protein, ur 30 (*)    Nitrite POSITIVE (*)     Leukocytes,Ua LARGE (*)    Bacteria, UA MANY (*)    All other components within normal limits     EKG  None   RADIOLOGY None   PROCEDURES:  Critical Care performed: No    MEDICATIONS ORDERED IN ED: Medications - No data to display   IMPRESSION / MDM / ASSESSMENT AND PLAN / ED COURSE  I reviewed the triage vital signs and the nursing notes.                              Differential diagnosis includes, but is not limited to, hematuria, infection  Patient's presentation is most consistent with acute presentation with potential threat to life or bodily function.  Patient presents to the emergency department today because of concerns for Foley catheter problems.  He has had to have it replaced twice because of obstruction.  The time my exam it is flowing well and he feels much better.  UA was checked  and is concerning for infection.  At this time I think it is likely what is causing the patient's issues.  Will have patient prescription for antibiotics.   FINAL CLINICAL IMPRESSION(S) / ED DIAGNOSES   Final diagnoses:  Lower urinary tract infectious disease  Problem with Foley catheter, initial encounter [facility])    Note:  This document was prepared using Dragon voice recognition software and may include unintentional dictation errors.    Phineas Semen, MD 07/15/22 [phone]

## 2022-07-16 ENCOUNTER — Telehealth: Payer: Self-pay

## 2022-07-16 ENCOUNTER — Telehealth: Payer: Self-pay | Admitting: Hospice and Palliative Medicine

## 2022-07-16 NOTE — Telephone Encounter (Signed)
Received a call from hospice nurse, Joy.  Patient seen in ED recently for UTI.  Currently on cephalexin.  Nurse reports that they have been having significant difficulty with urinary sediment requiring frequent catheter changes.  Agreed with scheduled twice weekly irrigation of catheter.  Will start patient on prophylactic Macrobid following completion of cephalexin.

## 2022-07-16 NOTE — Transitions of Care (Post Inpatient/ED Visit) (Signed)
   07/16/2022  Name: TAYSHAWN PURNELL MRN: [id number] DOB: 1960-04-27  Today's TOC FU Call Status: Today's TOC FU Call Status:: Unsuccessul Call (1st Attempt) Unsuccessful Call (1st Attempt) Date: 07/16/22  Attempted to reach the patient regarding the most recent Inpatient/ED visit.  Follow Up Plan: Additional outreach attempts will be made to reach the patient to complete the Transitions of Care (Post Inpatient/ED visit) call.   Carolie Mcilrath [facility]  Primary Care & Sports Medicine at [facility], [facility] [address]  [place] [address] Office [phone]  Fax: [phone]

## 2022-07-17 ENCOUNTER — Other Ambulatory Visit: Payer: Self-pay | Admitting: Internal Medicine

## 2022-07-17 NOTE — Telephone Encounter (Signed)
Left voicemail to schedule follow up appt

## 2022-07-17 NOTE — Telephone Encounter (Signed)
 Please contact pt for future appointment. ?Pt overdue for 3 month f/u. ?

## 2022-07-18 ENCOUNTER — Other Ambulatory Visit: Payer: Self-pay | Admitting: Hospice and Palliative Medicine

## 2022-07-18 MED ORDER — MORPHINE SULFATE ER 30 MG PO TBCR: 30.0000 mg | EXTENDED_RELEASE_TABLET | Freq: Three times a day (TID) | ORAL | Status: DC

## 2022-07-18 NOTE — Progress Notes (Signed)
Received a call from hospice nurse, [place] Bells.  Patient still endorses pain 7 out of 10 in right lung.  He is utilizing oxycodone 10 mg every 4 hours around-the-clock in addition to his scheduled MS Contin.  Will liberalize frequency of MS Contin to every 8 hours in an attempt to reduce breakthrough pain.

## 2022-07-19 ENCOUNTER — Other Ambulatory Visit: Payer: Self-pay | Admitting: Hospice and Palliative Medicine

## 2022-07-19 MED ORDER — OXYCODONE HCL 15 MG PO TABS: 15.0000 mg | ORAL_TABLET | ORAL | 0 refills | Status: DC | PRN

## 2022-07-19 NOTE — Telephone Encounter (Signed)
Patient currently under Hospice care.  Please advise on refill and f/u.  Thanks

## 2022-07-19 NOTE — Progress Notes (Signed)
Received a call from hospice nurse.  Reportedly, pain is still poorly controlled.  Patient does not feel like MS Contin is helping.  He took a friend's oxycodone 15 mg tablet and feels like that was much more effective for controlling the pain.  Patient and hospice request to increase dose of oxycodone IR from 10 to 15 mg.  Will send prescription to pharmacy.

## 2022-07-19 NOTE — Telephone Encounter (Signed)
Attempted to contact pt's wife. Left message to call back

## 2022-07-20 NOTE — Telephone Encounter (Signed)
Please schedule overdue F/U for 90 day refills. Thank you!

## 2022-07-21 ENCOUNTER — Encounter: Payer: Self-pay | Admitting: Oncology

## 2022-07-21 ENCOUNTER — Other Ambulatory Visit: Payer: Self-pay | Admitting: Internal Medicine

## 2022-07-23 NOTE — Telephone Encounter (Signed)
Left voicemail to schedule follow up appt

## 2022-07-23 NOTE — Telephone Encounter (Signed)
Please contact pt for future appointment. 

## 2022-07-23 NOTE — Transitions of Care (Post Inpatient/ED Visit) (Unsigned)
   07/23/2022  Name: Clinton Walsh MRN: [id number] DOB: [birth year]  Today's TOC FU Call Status: Today's TOC FU Call Status:: Unsuccessul Call (1st Attempt) Unsuccessful Call (1st Attempt) Date: 07/16/22  Attempted to reach the patient regarding the most recent Inpatient/ED visit.  Follow Up Plan: Additional outreach attempts will be made to reach the patient to complete the Transitions of Care (Post Inpatient/ED visit) call.   Pantera Winterrowd [facility]  Primary Care & Sports Medicine at [facility], [facility] [address]  [place] [address] Office [phone]  Fax: [phone]

## 2022-07-24 NOTE — Transitions of Care (Post Inpatient/ED Visit) (Signed)
   07/24/2022  Name: BARAN KUHRT MRN: [id number] DOB: 06-03-1960  Today's TOC FU Call Status: Today's TOC FU Call Status:: Unsuccessful Call (3rd Attempt) Unsuccessful Call (1st Attempt) Date: 07/16/22 [facility] FU Call Complete Date: 07/24/22  Attempted to reach the patient regarding the most recent Inpatient/ED visit.  Follow Up Plan: No further outreach attempts will be made at this time. We have been unable to contact the patient.  Emilee Market [facility]  Primary Care & Sports Medicine at [facility], [facility] [address]  [place] [address] Office [phone]  Fax: [phone]

## 2022-07-31 ENCOUNTER — Other Ambulatory Visit: Payer: Self-pay | Admitting: Hospice and Palliative Medicine

## 2022-07-31 MED ORDER — MORPHINE SULFATE ER 30 MG PO TBCR: 30.0000 mg | EXTENDED_RELEASE_TABLET | Freq: Three times a day (TID) | ORAL | 0 refills | Status: DC

## 2022-07-31 MED ORDER — MORPHINE SULFATE ER 30 MG PO TBCR: 30.0000 mg | EXTENDED_RELEASE_TABLET | Freq: Three times a day (TID) | ORAL | Status: DC

## 2022-07-31 NOTE — Progress Notes (Signed)
Hospice requested refill of morphine. Rx sent to pharmacy.

## 2022-08-02 ENCOUNTER — Other Ambulatory Visit: Payer: Self-pay

## 2022-08-03 MED ORDER — OXYCODONE HCL 15 MG PO TABS: 15.0000 mg | ORAL_TABLET | ORAL | 0 refills | Status: DC | PRN

## 2022-08-05 ENCOUNTER — Encounter: Payer: Self-pay | Admitting: Internal Medicine

## 2022-09-02 NOTE — Progress Notes (Signed)
The police department called that the patient passed away.  Will sign the DC.

## 2022-09-02 DEATH — deceased
